# Patient Record
Sex: Female | Born: 1935 | Race: White | Hispanic: No | Smoking: Never smoker
Health system: Southern US, Community
[De-identification: ages and names within clinical notes are randomized; demographics above are authoritative.]

## PROBLEM LIST (undated history)

## (undated) DIAGNOSIS — I10 Essential (primary) hypertension: Secondary | ICD-10-CM

## (undated) DIAGNOSIS — I639 Cerebral infarction, unspecified: Secondary | ICD-10-CM

## (undated) DIAGNOSIS — E876 Hypokalemia: Secondary | ICD-10-CM

## (undated) DIAGNOSIS — K219 Gastro-esophageal reflux disease without esophagitis: Secondary | ICD-10-CM

## (undated) HISTORY — PX: GALLBLADDER SURGERY: SHX652

## (undated) HISTORY — DX: Gastro-esophageal reflux disease without esophagitis: K21.9

## (undated) HISTORY — DX: Essential (primary) hypertension: I10

## (undated) HISTORY — DX: Hypokalemia: E87.6

## (undated) HISTORY — DX: Cerebral infarction, unspecified: I63.9

## (undated) HISTORY — PX: HIP FRACTURE SURGERY: SHX118

---

## 2005-01-24 ENCOUNTER — Ambulatory Visit: Payer: Self-pay | Admitting: Family Medicine

## 2007-08-01 ENCOUNTER — Ambulatory Visit: Payer: Self-pay | Admitting: Internal Medicine

## 2009-12-04 ENCOUNTER — Inpatient Hospital Stay: Payer: Self-pay | Admitting: Specialist

## 2009-12-09 ENCOUNTER — Encounter: Payer: Self-pay | Admitting: Internal Medicine

## 2010-01-02 ENCOUNTER — Encounter: Payer: Self-pay | Admitting: Internal Medicine

## 2010-01-04 ENCOUNTER — Ambulatory Visit: Payer: Self-pay | Admitting: Internal Medicine

## 2011-10-12 ENCOUNTER — Observation Stay: Payer: Self-pay | Admitting: Specialist

## 2011-10-16 DIAGNOSIS — Z7901 Long term (current) use of anticoagulants: Secondary | ICD-10-CM | POA: Diagnosis not present

## 2011-10-16 DIAGNOSIS — I10 Essential (primary) hypertension: Secondary | ICD-10-CM | POA: Diagnosis not present

## 2011-10-16 DIAGNOSIS — Z8673 Personal history of transient ischemic attack (TIA), and cerebral infarction without residual deficits: Secondary | ICD-10-CM | POA: Diagnosis not present

## 2011-10-16 DIAGNOSIS — M25559 Pain in unspecified hip: Secondary | ICD-10-CM | POA: Diagnosis not present

## 2011-10-16 DIAGNOSIS — Z8672 Personal history of thrombophlebitis: Secondary | ICD-10-CM | POA: Diagnosis not present

## 2011-10-16 DIAGNOSIS — R269 Unspecified abnormalities of gait and mobility: Secondary | ICD-10-CM | POA: Diagnosis not present

## 2011-10-17 DIAGNOSIS — R269 Unspecified abnormalities of gait and mobility: Secondary | ICD-10-CM | POA: Diagnosis not present

## 2011-10-17 DIAGNOSIS — Z8672 Personal history of thrombophlebitis: Secondary | ICD-10-CM | POA: Diagnosis not present

## 2011-10-17 DIAGNOSIS — Z7901 Long term (current) use of anticoagulants: Secondary | ICD-10-CM | POA: Diagnosis not present

## 2011-10-17 DIAGNOSIS — Z8673 Personal history of transient ischemic attack (TIA), and cerebral infarction without residual deficits: Secondary | ICD-10-CM | POA: Diagnosis not present

## 2011-10-17 DIAGNOSIS — M25559 Pain in unspecified hip: Secondary | ICD-10-CM | POA: Diagnosis not present

## 2011-10-17 DIAGNOSIS — I10 Essential (primary) hypertension: Secondary | ICD-10-CM | POA: Diagnosis not present

## 2011-10-18 DIAGNOSIS — I10 Essential (primary) hypertension: Secondary | ICD-10-CM | POA: Diagnosis not present

## 2011-10-18 DIAGNOSIS — M25559 Pain in unspecified hip: Secondary | ICD-10-CM | POA: Diagnosis not present

## 2011-10-18 DIAGNOSIS — R269 Unspecified abnormalities of gait and mobility: Secondary | ICD-10-CM | POA: Diagnosis not present

## 2011-10-18 DIAGNOSIS — Z8672 Personal history of thrombophlebitis: Secondary | ICD-10-CM | POA: Diagnosis not present

## 2011-10-18 DIAGNOSIS — Z7901 Long term (current) use of anticoagulants: Secondary | ICD-10-CM | POA: Diagnosis not present

## 2011-10-18 DIAGNOSIS — Z8673 Personal history of transient ischemic attack (TIA), and cerebral infarction without residual deficits: Secondary | ICD-10-CM | POA: Diagnosis not present

## 2011-10-22 DIAGNOSIS — I10 Essential (primary) hypertension: Secondary | ICD-10-CM | POA: Diagnosis not present

## 2011-10-22 DIAGNOSIS — Z7901 Long term (current) use of anticoagulants: Secondary | ICD-10-CM | POA: Diagnosis not present

## 2011-10-22 DIAGNOSIS — Z8672 Personal history of thrombophlebitis: Secondary | ICD-10-CM | POA: Diagnosis not present

## 2011-10-22 DIAGNOSIS — M25559 Pain in unspecified hip: Secondary | ICD-10-CM | POA: Diagnosis not present

## 2011-10-22 DIAGNOSIS — Z8673 Personal history of transient ischemic attack (TIA), and cerebral infarction without residual deficits: Secondary | ICD-10-CM | POA: Diagnosis not present

## 2011-10-22 DIAGNOSIS — R269 Unspecified abnormalities of gait and mobility: Secondary | ICD-10-CM | POA: Diagnosis not present

## 2011-10-23 DIAGNOSIS — Z8673 Personal history of transient ischemic attack (TIA), and cerebral infarction without residual deficits: Secondary | ICD-10-CM | POA: Diagnosis not present

## 2011-10-23 DIAGNOSIS — Z8672 Personal history of thrombophlebitis: Secondary | ICD-10-CM | POA: Diagnosis not present

## 2011-10-23 DIAGNOSIS — Z7901 Long term (current) use of anticoagulants: Secondary | ICD-10-CM | POA: Diagnosis not present

## 2011-10-23 DIAGNOSIS — I10 Essential (primary) hypertension: Secondary | ICD-10-CM | POA: Diagnosis not present

## 2011-10-23 DIAGNOSIS — M25559 Pain in unspecified hip: Secondary | ICD-10-CM | POA: Diagnosis not present

## 2011-10-23 DIAGNOSIS — R269 Unspecified abnormalities of gait and mobility: Secondary | ICD-10-CM | POA: Diagnosis not present

## 2011-10-25 DIAGNOSIS — Z7901 Long term (current) use of anticoagulants: Secondary | ICD-10-CM | POA: Diagnosis not present

## 2011-10-25 DIAGNOSIS — Z8672 Personal history of thrombophlebitis: Secondary | ICD-10-CM | POA: Diagnosis not present

## 2011-10-25 DIAGNOSIS — Z8673 Personal history of transient ischemic attack (TIA), and cerebral infarction without residual deficits: Secondary | ICD-10-CM | POA: Diagnosis not present

## 2011-10-25 DIAGNOSIS — I10 Essential (primary) hypertension: Secondary | ICD-10-CM | POA: Diagnosis not present

## 2011-10-25 DIAGNOSIS — M25559 Pain in unspecified hip: Secondary | ICD-10-CM | POA: Diagnosis not present

## 2011-10-25 DIAGNOSIS — R269 Unspecified abnormalities of gait and mobility: Secondary | ICD-10-CM | POA: Diagnosis not present

## 2011-10-30 DIAGNOSIS — M25559 Pain in unspecified hip: Secondary | ICD-10-CM | POA: Diagnosis not present

## 2011-10-30 DIAGNOSIS — I10 Essential (primary) hypertension: Secondary | ICD-10-CM | POA: Diagnosis not present

## 2011-10-30 DIAGNOSIS — R269 Unspecified abnormalities of gait and mobility: Secondary | ICD-10-CM | POA: Diagnosis not present

## 2011-10-30 DIAGNOSIS — Z7901 Long term (current) use of anticoagulants: Secondary | ICD-10-CM | POA: Diagnosis not present

## 2011-10-30 DIAGNOSIS — Z8672 Personal history of thrombophlebitis: Secondary | ICD-10-CM | POA: Diagnosis not present

## 2011-10-30 DIAGNOSIS — Z8673 Personal history of transient ischemic attack (TIA), and cerebral infarction without residual deficits: Secondary | ICD-10-CM | POA: Diagnosis not present

## 2011-10-31 DIAGNOSIS — R269 Unspecified abnormalities of gait and mobility: Secondary | ICD-10-CM | POA: Diagnosis not present

## 2011-10-31 DIAGNOSIS — Z8672 Personal history of thrombophlebitis: Secondary | ICD-10-CM | POA: Diagnosis not present

## 2011-10-31 DIAGNOSIS — Z7901 Long term (current) use of anticoagulants: Secondary | ICD-10-CM | POA: Diagnosis not present

## 2011-10-31 DIAGNOSIS — I10 Essential (primary) hypertension: Secondary | ICD-10-CM | POA: Diagnosis not present

## 2011-10-31 DIAGNOSIS — Z8673 Personal history of transient ischemic attack (TIA), and cerebral infarction without residual deficits: Secondary | ICD-10-CM | POA: Diagnosis not present

## 2011-10-31 DIAGNOSIS — M25559 Pain in unspecified hip: Secondary | ICD-10-CM | POA: Diagnosis not present

## 2011-11-07 DIAGNOSIS — Z8673 Personal history of transient ischemic attack (TIA), and cerebral infarction without residual deficits: Secondary | ICD-10-CM | POA: Diagnosis not present

## 2011-11-07 DIAGNOSIS — I10 Essential (primary) hypertension: Secondary | ICD-10-CM | POA: Diagnosis not present

## 2011-11-07 DIAGNOSIS — Z7901 Long term (current) use of anticoagulants: Secondary | ICD-10-CM | POA: Diagnosis not present

## 2011-11-07 DIAGNOSIS — Z8672 Personal history of thrombophlebitis: Secondary | ICD-10-CM | POA: Diagnosis not present

## 2011-11-07 DIAGNOSIS — R269 Unspecified abnormalities of gait and mobility: Secondary | ICD-10-CM | POA: Diagnosis not present

## 2011-11-07 DIAGNOSIS — M25559 Pain in unspecified hip: Secondary | ICD-10-CM | POA: Diagnosis not present

## 2011-12-04 ENCOUNTER — Inpatient Hospital Stay: Payer: Self-pay | Admitting: Internal Medicine

## 2011-12-04 DIAGNOSIS — R6889 Other general symptoms and signs: Secondary | ICD-10-CM | POA: Diagnosis not present

## 2011-12-04 DIAGNOSIS — M25559 Pain in unspecified hip: Secondary | ICD-10-CM | POA: Diagnosis not present

## 2011-12-04 DIAGNOSIS — I10 Essential (primary) hypertension: Secondary | ICD-10-CM | POA: Diagnosis not present

## 2011-12-04 DIAGNOSIS — Z86718 Personal history of other venous thrombosis and embolism: Secondary | ICD-10-CM | POA: Diagnosis not present

## 2011-12-04 DIAGNOSIS — Z79899 Other long term (current) drug therapy: Secondary | ICD-10-CM | POA: Diagnosis not present

## 2011-12-04 DIAGNOSIS — Z809 Family history of malignant neoplasm, unspecified: Secondary | ICD-10-CM | POA: Diagnosis not present

## 2011-12-04 DIAGNOSIS — M1990 Unspecified osteoarthritis, unspecified site: Secondary | ICD-10-CM | POA: Diagnosis not present

## 2011-12-04 DIAGNOSIS — Z841 Family history of disorders of kidney and ureter: Secondary | ICD-10-CM | POA: Diagnosis not present

## 2011-12-04 DIAGNOSIS — Z8673 Personal history of transient ischemic attack (TIA), and cerebral infarction without residual deficits: Secondary | ICD-10-CM | POA: Diagnosis not present

## 2011-12-04 DIAGNOSIS — Z7901 Long term (current) use of anticoagulants: Secondary | ICD-10-CM | POA: Diagnosis not present

## 2011-12-04 DIAGNOSIS — K219 Gastro-esophageal reflux disease without esophagitis: Secondary | ICD-10-CM | POA: Diagnosis not present

## 2011-12-04 DIAGNOSIS — S32509A Unspecified fracture of unspecified pubis, initial encounter for closed fracture: Secondary | ICD-10-CM | POA: Diagnosis not present

## 2011-12-04 DIAGNOSIS — Z9181 History of falling: Secondary | ICD-10-CM | POA: Diagnosis not present

## 2011-12-04 DIAGNOSIS — R079 Chest pain, unspecified: Secondary | ICD-10-CM | POA: Diagnosis not present

## 2011-12-04 DIAGNOSIS — Z9089 Acquired absence of other organs: Secondary | ICD-10-CM | POA: Diagnosis not present

## 2011-12-04 DIAGNOSIS — IMO0002 Reserved for concepts with insufficient information to code with codable children: Secondary | ICD-10-CM | POA: Diagnosis not present

## 2011-12-04 DIAGNOSIS — M25569 Pain in unspecified knee: Secondary | ICD-10-CM | POA: Diagnosis not present

## 2011-12-04 DIAGNOSIS — Z833 Family history of diabetes mellitus: Secondary | ICD-10-CM | POA: Diagnosis not present

## 2011-12-04 DIAGNOSIS — I82409 Acute embolism and thrombosis of unspecified deep veins of unspecified lower extremity: Secondary | ICD-10-CM | POA: Diagnosis not present

## 2011-12-04 LAB — BASIC METABOLIC PANEL
Anion Gap: 12 (ref 7–16)
Creatinine: 0.75 mg/dL (ref 0.60–1.30)
EGFR (African American): >60
Osmolality: 269 (ref 275–301)
Sodium: 134 mmol/L — ABNORMAL LOW (ref 136–145)

## 2011-12-04 LAB — PROTIME-INR
INR: 2.3
Prothrombin Time: 25.1 secs — ABNORMAL HIGH (ref 11.5–14.7)

## 2011-12-04 LAB — CBC
HCT: 39.9 % (ref 35.0–47.0)
HGB: 13.2 g/dL (ref 12.0–16.0)
MCH: 29.4 pg (ref 26.0–34.0)
MCHC: 33.2 g/dL (ref 32.0–36.0)
MCV: 89 fL (ref 80–100)
RBC: 4.51 10*6/uL (ref 3.80–5.20)

## 2011-12-04 LAB — URINALYSIS, COMPLETE
Bacteria: NONE SEEN
Glucose,UR: NEGATIVE mg/dL (ref 0–75)
Leukocyte Esterase: NEGATIVE
Nitrite: NEGATIVE
Protein: NEGATIVE
Specific Gravity: 1.012 (ref 1.003–1.030)
WBC UR: 2 /HPF (ref 0–5)

## 2011-12-05 LAB — BASIC METABOLIC PANEL
Anion Gap: 11 (ref 7–16)
BUN: 13 mg/dL (ref 7–18)
Chloride: 99 mmol/L (ref 98–107)
Co2: 26 mmol/L (ref 21–32)
Creatinine: 0.53 mg/dL — ABNORMAL LOW (ref 0.60–1.30)
EGFR (African American): >60
EGFR (Non-African Amer.): >60
Glucose: 86 mg/dL (ref 65–99)

## 2011-12-05 LAB — CBC WITH DIFFERENTIAL/PLATELET
Basophil #: 0 10*3/uL (ref 0.0–0.1)
Basophil %: 0.9 %
Eosinophil %: 3.6 %
HCT: 34.9 % — ABNORMAL LOW (ref 35.0–47.0)
HGB: 11.8 g/dL — ABNORMAL LOW (ref 12.0–16.0)
Lymphocyte %: 22.9 %
MCHC: 33.8 g/dL (ref 32.0–36.0)
Monocyte %: 7.9 %
Neutrophil #: 2.3 10*3/uL (ref 1.4–6.5)
Neutrophil %: 64.7 %
RBC: 3.96 10*6/uL (ref 3.80–5.20)
WBC: 3.5 10*3/uL — ABNORMAL LOW (ref 3.6–11.0)

## 2011-12-05 LAB — LIPID PANEL
Ldl Cholesterol, Calc: 77 mg/dL (ref 0–100)
VLDL Cholesterol, Calc: 15 mg/dL (ref 5–40)

## 2011-12-05 LAB — HEMOGLOBIN A1C: Hemoglobin A1C: 5.5 % (ref 4.2–6.3)

## 2011-12-05 LAB — PROTIME-INR: Prothrombin Time: 27.7 secs — ABNORMAL HIGH (ref 11.5–14.7)

## 2011-12-06 LAB — PROTIME-INR
INR: 3.3
Prothrombin Time: 33.7 secs — ABNORMAL HIGH (ref 11.5–14.7)

## 2011-12-07 DIAGNOSIS — IMO0001 Reserved for inherently not codable concepts without codable children: Secondary | ICD-10-CM | POA: Diagnosis not present

## 2011-12-07 DIAGNOSIS — R269 Unspecified abnormalities of gait and mobility: Secondary | ICD-10-CM | POA: Diagnosis not present

## 2011-12-07 DIAGNOSIS — Z8673 Personal history of transient ischemic attack (TIA), and cerebral infarction without residual deficits: Secondary | ICD-10-CM | POA: Diagnosis not present

## 2011-12-07 DIAGNOSIS — M25559 Pain in unspecified hip: Secondary | ICD-10-CM | POA: Diagnosis not present

## 2011-12-07 DIAGNOSIS — I10 Essential (primary) hypertension: Secondary | ICD-10-CM | POA: Diagnosis not present

## 2011-12-10 DIAGNOSIS — IMO0001 Reserved for inherently not codable concepts without codable children: Secondary | ICD-10-CM | POA: Diagnosis not present

## 2011-12-10 DIAGNOSIS — M25559 Pain in unspecified hip: Secondary | ICD-10-CM | POA: Diagnosis not present

## 2011-12-10 DIAGNOSIS — I10 Essential (primary) hypertension: Secondary | ICD-10-CM | POA: Diagnosis not present

## 2011-12-10 DIAGNOSIS — Z8673 Personal history of transient ischemic attack (TIA), and cerebral infarction without residual deficits: Secondary | ICD-10-CM | POA: Diagnosis not present

## 2011-12-10 DIAGNOSIS — R269 Unspecified abnormalities of gait and mobility: Secondary | ICD-10-CM | POA: Diagnosis not present

## 2011-12-11 DIAGNOSIS — Z8673 Personal history of transient ischemic attack (TIA), and cerebral infarction without residual deficits: Secondary | ICD-10-CM | POA: Diagnosis not present

## 2011-12-11 DIAGNOSIS — M25559 Pain in unspecified hip: Secondary | ICD-10-CM | POA: Diagnosis not present

## 2011-12-11 DIAGNOSIS — IMO0001 Reserved for inherently not codable concepts without codable children: Secondary | ICD-10-CM | POA: Diagnosis not present

## 2011-12-11 DIAGNOSIS — R269 Unspecified abnormalities of gait and mobility: Secondary | ICD-10-CM | POA: Diagnosis not present

## 2011-12-11 DIAGNOSIS — I10 Essential (primary) hypertension: Secondary | ICD-10-CM | POA: Diagnosis not present

## 2011-12-13 DIAGNOSIS — Z8673 Personal history of transient ischemic attack (TIA), and cerebral infarction without residual deficits: Secondary | ICD-10-CM | POA: Diagnosis not present

## 2011-12-13 DIAGNOSIS — R269 Unspecified abnormalities of gait and mobility: Secondary | ICD-10-CM | POA: Diagnosis not present

## 2011-12-13 DIAGNOSIS — M25559 Pain in unspecified hip: Secondary | ICD-10-CM | POA: Diagnosis not present

## 2011-12-13 DIAGNOSIS — I10 Essential (primary) hypertension: Secondary | ICD-10-CM | POA: Diagnosis not present

## 2011-12-13 DIAGNOSIS — IMO0001 Reserved for inherently not codable concepts without codable children: Secondary | ICD-10-CM | POA: Diagnosis not present

## 2011-12-16 DIAGNOSIS — S72009D Fracture of unspecified part of neck of unspecified femur, subsequent encounter for closed fracture with routine healing: Secondary | ICD-10-CM | POA: Diagnosis not present

## 2011-12-16 DIAGNOSIS — M1990 Unspecified osteoarthritis, unspecified site: Secondary | ICD-10-CM | POA: Diagnosis not present

## 2011-12-17 DIAGNOSIS — I10 Essential (primary) hypertension: Secondary | ICD-10-CM | POA: Diagnosis not present

## 2011-12-17 DIAGNOSIS — IMO0001 Reserved for inherently not codable concepts without codable children: Secondary | ICD-10-CM | POA: Diagnosis not present

## 2011-12-17 DIAGNOSIS — M25559 Pain in unspecified hip: Secondary | ICD-10-CM | POA: Diagnosis not present

## 2011-12-17 DIAGNOSIS — R269 Unspecified abnormalities of gait and mobility: Secondary | ICD-10-CM | POA: Diagnosis not present

## 2011-12-17 DIAGNOSIS — Z8673 Personal history of transient ischemic attack (TIA), and cerebral infarction without residual deficits: Secondary | ICD-10-CM | POA: Diagnosis not present

## 2011-12-18 DIAGNOSIS — IMO0001 Reserved for inherently not codable concepts without codable children: Secondary | ICD-10-CM | POA: Diagnosis not present

## 2011-12-18 DIAGNOSIS — R269 Unspecified abnormalities of gait and mobility: Secondary | ICD-10-CM | POA: Diagnosis not present

## 2011-12-18 DIAGNOSIS — M25559 Pain in unspecified hip: Secondary | ICD-10-CM | POA: Diagnosis not present

## 2011-12-18 DIAGNOSIS — I10 Essential (primary) hypertension: Secondary | ICD-10-CM | POA: Diagnosis not present

## 2011-12-18 DIAGNOSIS — Z8673 Personal history of transient ischemic attack (TIA), and cerebral infarction without residual deficits: Secondary | ICD-10-CM | POA: Diagnosis not present

## 2011-12-19 DIAGNOSIS — M25559 Pain in unspecified hip: Secondary | ICD-10-CM | POA: Diagnosis not present

## 2011-12-19 DIAGNOSIS — IMO0001 Reserved for inherently not codable concepts without codable children: Secondary | ICD-10-CM | POA: Diagnosis not present

## 2011-12-19 DIAGNOSIS — R269 Unspecified abnormalities of gait and mobility: Secondary | ICD-10-CM | POA: Diagnosis not present

## 2011-12-19 DIAGNOSIS — Z8673 Personal history of transient ischemic attack (TIA), and cerebral infarction without residual deficits: Secondary | ICD-10-CM | POA: Diagnosis not present

## 2011-12-19 DIAGNOSIS — I10 Essential (primary) hypertension: Secondary | ICD-10-CM | POA: Diagnosis not present

## 2011-12-24 DIAGNOSIS — I10 Essential (primary) hypertension: Secondary | ICD-10-CM | POA: Diagnosis not present

## 2011-12-24 DIAGNOSIS — IMO0001 Reserved for inherently not codable concepts without codable children: Secondary | ICD-10-CM | POA: Diagnosis not present

## 2011-12-24 DIAGNOSIS — M25559 Pain in unspecified hip: Secondary | ICD-10-CM | POA: Diagnosis not present

## 2011-12-24 DIAGNOSIS — Z8673 Personal history of transient ischemic attack (TIA), and cerebral infarction without residual deficits: Secondary | ICD-10-CM | POA: Diagnosis not present

## 2011-12-24 DIAGNOSIS — R269 Unspecified abnormalities of gait and mobility: Secondary | ICD-10-CM | POA: Diagnosis not present

## 2011-12-27 DIAGNOSIS — I10 Essential (primary) hypertension: Secondary | ICD-10-CM | POA: Diagnosis not present

## 2011-12-27 DIAGNOSIS — R269 Unspecified abnormalities of gait and mobility: Secondary | ICD-10-CM | POA: Diagnosis not present

## 2011-12-27 DIAGNOSIS — M25559 Pain in unspecified hip: Secondary | ICD-10-CM | POA: Diagnosis not present

## 2011-12-27 DIAGNOSIS — Z8673 Personal history of transient ischemic attack (TIA), and cerebral infarction without residual deficits: Secondary | ICD-10-CM | POA: Diagnosis not present

## 2011-12-27 DIAGNOSIS — IMO0001 Reserved for inherently not codable concepts without codable children: Secondary | ICD-10-CM | POA: Diagnosis not present

## 2011-12-31 DIAGNOSIS — R269 Unspecified abnormalities of gait and mobility: Secondary | ICD-10-CM | POA: Diagnosis not present

## 2011-12-31 DIAGNOSIS — I10 Essential (primary) hypertension: Secondary | ICD-10-CM | POA: Diagnosis not present

## 2011-12-31 DIAGNOSIS — Z8673 Personal history of transient ischemic attack (TIA), and cerebral infarction without residual deficits: Secondary | ICD-10-CM | POA: Diagnosis not present

## 2011-12-31 DIAGNOSIS — M25559 Pain in unspecified hip: Secondary | ICD-10-CM | POA: Diagnosis not present

## 2011-12-31 DIAGNOSIS — IMO0001 Reserved for inherently not codable concepts without codable children: Secondary | ICD-10-CM | POA: Diagnosis not present

## 2012-01-02 DIAGNOSIS — Z8673 Personal history of transient ischemic attack (TIA), and cerebral infarction without residual deficits: Secondary | ICD-10-CM | POA: Diagnosis not present

## 2012-01-02 DIAGNOSIS — I10 Essential (primary) hypertension: Secondary | ICD-10-CM | POA: Diagnosis not present

## 2012-01-02 DIAGNOSIS — M25559 Pain in unspecified hip: Secondary | ICD-10-CM | POA: Diagnosis not present

## 2012-01-02 DIAGNOSIS — IMO0001 Reserved for inherently not codable concepts without codable children: Secondary | ICD-10-CM | POA: Diagnosis not present

## 2012-01-02 DIAGNOSIS — R269 Unspecified abnormalities of gait and mobility: Secondary | ICD-10-CM | POA: Diagnosis not present

## 2012-02-04 DIAGNOSIS — Z7901 Long term (current) use of anticoagulants: Secondary | ICD-10-CM | POA: Diagnosis not present

## 2012-03-03 DIAGNOSIS — H61019 Acute perichondritis of external ear, unspecified ear: Secondary | ICD-10-CM | POA: Diagnosis not present

## 2012-03-03 DIAGNOSIS — H903 Sensorineural hearing loss, bilateral: Secondary | ICD-10-CM | POA: Diagnosis not present

## 2012-03-03 DIAGNOSIS — H9209 Otalgia, unspecified ear: Secondary | ICD-10-CM | POA: Diagnosis not present

## 2012-03-19 DIAGNOSIS — Z7901 Long term (current) use of anticoagulants: Secondary | ICD-10-CM | POA: Diagnosis not present

## 2012-04-07 DIAGNOSIS — E785 Hyperlipidemia, unspecified: Secondary | ICD-10-CM | POA: Diagnosis not present

## 2012-04-07 DIAGNOSIS — Z79899 Other long term (current) drug therapy: Secondary | ICD-10-CM | POA: Diagnosis not present

## 2012-04-07 DIAGNOSIS — I10 Essential (primary) hypertension: Secondary | ICD-10-CM | POA: Diagnosis not present

## 2012-04-07 DIAGNOSIS — K219 Gastro-esophageal reflux disease without esophagitis: Secondary | ICD-10-CM | POA: Diagnosis not present

## 2012-04-07 DIAGNOSIS — E876 Hypokalemia: Secondary | ICD-10-CM | POA: Diagnosis not present

## 2012-04-07 DIAGNOSIS — Z7901 Long term (current) use of anticoagulants: Secondary | ICD-10-CM | POA: Diagnosis not present

## 2012-05-19 DIAGNOSIS — Z7901 Long term (current) use of anticoagulants: Secondary | ICD-10-CM | POA: Diagnosis not present

## 2012-07-08 ENCOUNTER — Emergency Department: Payer: Self-pay | Admitting: *Deleted

## 2012-07-08 DIAGNOSIS — H9209 Otalgia, unspecified ear: Secondary | ICD-10-CM | POA: Diagnosis not present

## 2012-07-08 DIAGNOSIS — R52 Pain, unspecified: Secondary | ICD-10-CM | POA: Diagnosis not present

## 2012-07-13 DIAGNOSIS — Z7901 Long term (current) use of anticoagulants: Secondary | ICD-10-CM | POA: Diagnosis not present

## 2012-08-21 DIAGNOSIS — Z7901 Long term (current) use of anticoagulants: Secondary | ICD-10-CM | POA: Diagnosis not present

## 2012-09-08 DIAGNOSIS — Z7901 Long term (current) use of anticoagulants: Secondary | ICD-10-CM | POA: Diagnosis not present

## 2012-09-22 DIAGNOSIS — Z7901 Long term (current) use of anticoagulants: Secondary | ICD-10-CM | POA: Diagnosis not present

## 2012-10-20 DIAGNOSIS — K219 Gastro-esophageal reflux disease without esophagitis: Secondary | ICD-10-CM | POA: Diagnosis not present

## 2012-10-20 DIAGNOSIS — E876 Hypokalemia: Secondary | ICD-10-CM | POA: Diagnosis not present

## 2012-10-20 DIAGNOSIS — I10 Essential (primary) hypertension: Secondary | ICD-10-CM | POA: Diagnosis not present

## 2012-10-20 DIAGNOSIS — Z7901 Long term (current) use of anticoagulants: Secondary | ICD-10-CM | POA: Diagnosis not present

## 2012-11-16 DIAGNOSIS — Z7901 Long term (current) use of anticoagulants: Secondary | ICD-10-CM | POA: Diagnosis not present

## 2012-12-01 DIAGNOSIS — Z7901 Long term (current) use of anticoagulants: Secondary | ICD-10-CM | POA: Diagnosis not present

## 2013-02-15 DIAGNOSIS — Z7901 Long term (current) use of anticoagulants: Secondary | ICD-10-CM | POA: Diagnosis not present

## 2013-04-13 DIAGNOSIS — E876 Hypokalemia: Secondary | ICD-10-CM | POA: Diagnosis not present

## 2013-04-13 DIAGNOSIS — K219 Gastro-esophageal reflux disease without esophagitis: Secondary | ICD-10-CM | POA: Diagnosis not present

## 2013-04-13 DIAGNOSIS — I10 Essential (primary) hypertension: Secondary | ICD-10-CM | POA: Diagnosis not present

## 2013-04-13 DIAGNOSIS — E785 Hyperlipidemia, unspecified: Secondary | ICD-10-CM | POA: Diagnosis not present

## 2013-04-13 DIAGNOSIS — N390 Urinary tract infection, site not specified: Secondary | ICD-10-CM | POA: Diagnosis not present

## 2013-04-13 DIAGNOSIS — Z7901 Long term (current) use of anticoagulants: Secondary | ICD-10-CM | POA: Diagnosis not present

## 2013-06-28 DIAGNOSIS — M7520 Bicipital tendinitis, unspecified shoulder: Secondary | ICD-10-CM | POA: Diagnosis not present

## 2013-06-28 DIAGNOSIS — Z7901 Long term (current) use of anticoagulants: Secondary | ICD-10-CM | POA: Diagnosis not present

## 2013-07-23 DIAGNOSIS — Z7901 Long term (current) use of anticoagulants: Secondary | ICD-10-CM | POA: Diagnosis not present

## 2013-07-23 DIAGNOSIS — S2239XA Fracture of one rib, unspecified side, initial encounter for closed fracture: Secondary | ICD-10-CM | POA: Diagnosis not present

## 2013-09-06 DIAGNOSIS — Z7901 Long term (current) use of anticoagulants: Secondary | ICD-10-CM | POA: Diagnosis not present

## 2013-10-13 DIAGNOSIS — K219 Gastro-esophageal reflux disease without esophagitis: Secondary | ICD-10-CM | POA: Diagnosis not present

## 2013-10-13 DIAGNOSIS — I10 Essential (primary) hypertension: Secondary | ICD-10-CM | POA: Diagnosis not present

## 2013-10-13 DIAGNOSIS — E785 Hyperlipidemia, unspecified: Secondary | ICD-10-CM | POA: Diagnosis not present

## 2013-10-13 DIAGNOSIS — Z7901 Long term (current) use of anticoagulants: Secondary | ICD-10-CM | POA: Diagnosis not present

## 2013-11-09 DIAGNOSIS — Z7901 Long term (current) use of anticoagulants: Secondary | ICD-10-CM | POA: Diagnosis not present

## 2013-11-23 DIAGNOSIS — M5137 Other intervertebral disc degeneration, lumbosacral region: Secondary | ICD-10-CM | POA: Diagnosis not present

## 2013-11-23 DIAGNOSIS — N390 Urinary tract infection, site not specified: Secondary | ICD-10-CM | POA: Diagnosis not present

## 2013-12-13 DIAGNOSIS — Z7901 Long term (current) use of anticoagulants: Secondary | ICD-10-CM | POA: Diagnosis not present

## 2013-12-13 DIAGNOSIS — J329 Chronic sinusitis, unspecified: Secondary | ICD-10-CM | POA: Diagnosis not present

## 2014-01-17 DIAGNOSIS — Z7901 Long term (current) use of anticoagulants: Secondary | ICD-10-CM | POA: Diagnosis not present

## 2014-02-02 DIAGNOSIS — M79609 Pain in unspecified limb: Secondary | ICD-10-CM | POA: Diagnosis not present

## 2014-02-02 DIAGNOSIS — Z7901 Long term (current) use of anticoagulants: Secondary | ICD-10-CM | POA: Diagnosis not present

## 2014-02-02 DIAGNOSIS — M7989 Other specified soft tissue disorders: Secondary | ICD-10-CM | POA: Diagnosis not present

## 2014-02-02 DIAGNOSIS — M1990 Unspecified osteoarthritis, unspecified site: Secondary | ICD-10-CM | POA: Diagnosis not present

## 2014-02-02 DIAGNOSIS — I824Y9 Acute embolism and thrombosis of unspecified deep veins of unspecified proximal lower extremity: Secondary | ICD-10-CM | POA: Diagnosis not present

## 2014-02-23 DIAGNOSIS — Z7901 Long term (current) use of anticoagulants: Secondary | ICD-10-CM | POA: Diagnosis not present

## 2014-03-04 DIAGNOSIS — M7989 Other specified soft tissue disorders: Secondary | ICD-10-CM | POA: Diagnosis not present

## 2014-03-04 DIAGNOSIS — I87099 Postthrombotic syndrome with other complications of unspecified lower extremity: Secondary | ICD-10-CM | POA: Diagnosis not present

## 2014-03-04 DIAGNOSIS — I10 Essential (primary) hypertension: Secondary | ICD-10-CM | POA: Diagnosis not present

## 2014-03-04 DIAGNOSIS — M79609 Pain in unspecified limb: Secondary | ICD-10-CM | POA: Diagnosis not present

## 2014-03-29 DIAGNOSIS — Z7901 Long term (current) use of anticoagulants: Secondary | ICD-10-CM | POA: Diagnosis not present

## 2014-04-27 DIAGNOSIS — Z7901 Long term (current) use of anticoagulants: Secondary | ICD-10-CM | POA: Diagnosis not present

## 2014-05-09 DIAGNOSIS — E785 Hyperlipidemia, unspecified: Secondary | ICD-10-CM | POA: Diagnosis not present

## 2014-05-09 DIAGNOSIS — I10 Essential (primary) hypertension: Secondary | ICD-10-CM | POA: Diagnosis not present

## 2014-05-09 DIAGNOSIS — Z7901 Long term (current) use of anticoagulants: Secondary | ICD-10-CM | POA: Diagnosis not present

## 2014-05-09 DIAGNOSIS — E876 Hypokalemia: Secondary | ICD-10-CM | POA: Diagnosis not present

## 2014-05-25 DIAGNOSIS — Z7901 Long term (current) use of anticoagulants: Secondary | ICD-10-CM | POA: Diagnosis not present

## 2014-06-21 DIAGNOSIS — I87099 Postthrombotic syndrome with other complications of unspecified lower extremity: Secondary | ICD-10-CM | POA: Diagnosis not present

## 2014-06-21 DIAGNOSIS — M79609 Pain in unspecified limb: Secondary | ICD-10-CM | POA: Diagnosis not present

## 2014-06-21 DIAGNOSIS — M7989 Other specified soft tissue disorders: Secondary | ICD-10-CM | POA: Diagnosis not present

## 2014-06-22 ENCOUNTER — Ambulatory Visit: Payer: Self-pay | Admitting: Orthopedic Surgery

## 2014-06-22 ENCOUNTER — Emergency Department: Payer: Self-pay | Admitting: Emergency Medicine

## 2014-06-22 DIAGNOSIS — S59909A Unspecified injury of unspecified elbow, initial encounter: Secondary | ICD-10-CM | POA: Diagnosis not present

## 2014-06-22 DIAGNOSIS — Z7901 Long term (current) use of anticoagulants: Secondary | ICD-10-CM | POA: Diagnosis not present

## 2014-06-22 DIAGNOSIS — Z79899 Other long term (current) drug therapy: Secondary | ICD-10-CM | POA: Diagnosis not present

## 2014-06-22 DIAGNOSIS — I10 Essential (primary) hypertension: Secondary | ICD-10-CM | POA: Diagnosis not present

## 2014-06-22 DIAGNOSIS — S52599A Other fractures of lower end of unspecified radius, initial encounter for closed fracture: Secondary | ICD-10-CM | POA: Diagnosis not present

## 2014-06-22 LAB — PROTIME-INR
INR: 1.8
Prothrombin Time: 20.8 secs — ABNORMAL HIGH (ref 11.5–14.7)

## 2014-06-24 DIAGNOSIS — I82409 Acute embolism and thrombosis of unspecified deep veins of unspecified lower extremity: Secondary | ICD-10-CM | POA: Insufficient documentation

## 2014-06-27 ENCOUNTER — Ambulatory Visit: Payer: Self-pay | Admitting: Orthopedic Surgery

## 2014-06-27 DIAGNOSIS — Z01810 Encounter for preprocedural cardiovascular examination: Secondary | ICD-10-CM | POA: Diagnosis not present

## 2014-06-27 DIAGNOSIS — S52539A Colles' fracture of unspecified radius, initial encounter for closed fracture: Secondary | ICD-10-CM | POA: Diagnosis not present

## 2014-06-27 DIAGNOSIS — Z01812 Encounter for preprocedural laboratory examination: Secondary | ICD-10-CM | POA: Diagnosis not present

## 2014-06-27 DIAGNOSIS — I10 Essential (primary) hypertension: Secondary | ICD-10-CM | POA: Diagnosis not present

## 2014-06-27 LAB — CBC WITH DIFFERENTIAL/PLATELET
Basophil #: 0 10*3/uL (ref 0.0–0.1)
Basophil %: 1 %
Eosinophil #: 0.2 10*3/uL (ref 0.0–0.7)
Eosinophil %: 3.4 %
HCT: 39.1 % (ref 35.0–47.0)
HGB: 12.5 g/dL (ref 12.0–16.0)
LYMPHS PCT: 20.3 %
Lymphocyte #: 1 10*3/uL (ref 1.0–3.6)
MCH: 28.8 pg (ref 26.0–34.0)
MCHC: 31.9 g/dL — ABNORMAL LOW (ref 32.0–36.0)
MCV: 90 fL (ref 80–100)
Monocyte #: 0.4 x10 3/mm (ref 0.2–0.9)
Monocyte %: 9 %
Neutrophil #: 3.3 10*3/uL (ref 1.4–6.5)
Neutrophil %: 66.3 %
Platelet: 269 10*3/uL (ref 150–440)
RBC: 4.33 10*6/uL (ref 3.80–5.20)
RDW: 13.5 % (ref 11.5–14.5)
WBC: 4.9 10*3/uL (ref 3.6–11.0)

## 2014-06-27 LAB — BASIC METABOLIC PANEL
Anion Gap: 11 (ref 7–16)
BUN: 19 mg/dL — ABNORMAL HIGH (ref 7–18)
Calcium, Total: 9.1 mg/dL (ref 8.5–10.1)
Chloride: 98 mmol/L (ref 98–107)
Co2: 30 mmol/L (ref 21–32)
Creatinine: 0.84 mg/dL (ref 0.60–1.30)
EGFR (Non-African Amer.): >60
GLUCOSE: 96 mg/dL (ref 65–99)
Osmolality: 280 (ref 275–301)
Potassium: 3.9 mmol/L (ref 3.5–5.1)
Sodium: 139 mmol/L (ref 136–145)

## 2014-06-28 ENCOUNTER — Ambulatory Visit: Payer: Self-pay | Admitting: Orthopedic Surgery

## 2014-06-28 DIAGNOSIS — S52599A Other fractures of lower end of unspecified radius, initial encounter for closed fracture: Secondary | ICD-10-CM | POA: Diagnosis not present

## 2014-06-28 DIAGNOSIS — S5290XA Unspecified fracture of unspecified forearm, initial encounter for closed fracture: Secondary | ICD-10-CM | POA: Diagnosis not present

## 2014-06-28 DIAGNOSIS — Z7901 Long term (current) use of anticoagulants: Secondary | ICD-10-CM | POA: Diagnosis not present

## 2014-06-28 DIAGNOSIS — S52109A Unspecified fracture of upper end of unspecified radius, initial encounter for closed fracture: Secondary | ICD-10-CM | POA: Diagnosis not present

## 2014-06-28 DIAGNOSIS — Z8249 Family history of ischemic heart disease and other diseases of the circulatory system: Secondary | ICD-10-CM | POA: Diagnosis not present

## 2014-06-28 DIAGNOSIS — IMO0002 Reserved for concepts with insufficient information to code with codable children: Secondary | ICD-10-CM | POA: Diagnosis not present

## 2014-06-28 DIAGNOSIS — Z86718 Personal history of other venous thrombosis and embolism: Secondary | ICD-10-CM | POA: Diagnosis not present

## 2014-06-28 DIAGNOSIS — I10 Essential (primary) hypertension: Secondary | ICD-10-CM | POA: Diagnosis not present

## 2014-06-28 DIAGNOSIS — Z96649 Presence of unspecified artificial hip joint: Secondary | ICD-10-CM | POA: Diagnosis not present

## 2014-06-28 DIAGNOSIS — Z8673 Personal history of transient ischemic attack (TIA), and cerebral infarction without residual deficits: Secondary | ICD-10-CM | POA: Diagnosis not present

## 2014-06-28 DIAGNOSIS — Z79899 Other long term (current) drug therapy: Secondary | ICD-10-CM | POA: Diagnosis not present

## 2014-06-28 DIAGNOSIS — K219 Gastro-esophageal reflux disease without esophagitis: Secondary | ICD-10-CM | POA: Diagnosis not present

## 2014-06-28 LAB — PROTIME-INR
INR: 1.1
PROTHROMBIN TIME: 13.9 s (ref 11.5–14.7)

## 2014-08-01 DIAGNOSIS — M25539 Pain in unspecified wrist: Secondary | ICD-10-CM | POA: Diagnosis not present

## 2014-08-01 DIAGNOSIS — Z7901 Long term (current) use of anticoagulants: Secondary | ICD-10-CM | POA: Diagnosis not present

## 2014-09-12 DIAGNOSIS — M25532 Pain in left wrist: Secondary | ICD-10-CM | POA: Diagnosis not present

## 2014-09-12 DIAGNOSIS — Z7901 Long term (current) use of anticoagulants: Secondary | ICD-10-CM | POA: Diagnosis not present

## 2014-09-26 DIAGNOSIS — Z7901 Long term (current) use of anticoagulants: Secondary | ICD-10-CM | POA: Diagnosis not present

## 2014-10-10 DIAGNOSIS — Z79899 Other long term (current) drug therapy: Secondary | ICD-10-CM | POA: Diagnosis not present

## 2014-10-11 DIAGNOSIS — D485 Neoplasm of uncertain behavior of skin: Secondary | ICD-10-CM | POA: Diagnosis not present

## 2014-10-11 DIAGNOSIS — L980 Pyogenic granuloma: Secondary | ICD-10-CM | POA: Diagnosis not present

## 2014-10-31 DIAGNOSIS — Z7901 Long term (current) use of anticoagulants: Secondary | ICD-10-CM | POA: Diagnosis not present

## 2014-11-16 DIAGNOSIS — E876 Hypokalemia: Secondary | ICD-10-CM | POA: Diagnosis not present

## 2014-11-16 DIAGNOSIS — E784 Other hyperlipidemia: Secondary | ICD-10-CM | POA: Diagnosis not present

## 2014-11-16 DIAGNOSIS — I10 Essential (primary) hypertension: Secondary | ICD-10-CM | POA: Diagnosis not present

## 2014-11-16 DIAGNOSIS — K219 Gastro-esophageal reflux disease without esophagitis: Secondary | ICD-10-CM | POA: Diagnosis not present

## 2014-12-05 DIAGNOSIS — Z7901 Long term (current) use of anticoagulants: Secondary | ICD-10-CM | POA: Diagnosis not present

## 2014-12-21 DIAGNOSIS — Z7901 Long term (current) use of anticoagulants: Secondary | ICD-10-CM | POA: Diagnosis not present

## 2015-01-02 DIAGNOSIS — Z7901 Long term (current) use of anticoagulants: Secondary | ICD-10-CM | POA: Diagnosis not present

## 2015-01-16 DIAGNOSIS — Z7901 Long term (current) use of anticoagulants: Secondary | ICD-10-CM | POA: Diagnosis not present

## 2015-01-24 DIAGNOSIS — I87099 Postthrombotic syndrome with other complications of unspecified lower extremity: Secondary | ICD-10-CM | POA: Diagnosis not present

## 2015-01-24 DIAGNOSIS — M7989 Other specified soft tissue disorders: Secondary | ICD-10-CM | POA: Diagnosis not present

## 2015-02-06 DIAGNOSIS — Z7901 Long term (current) use of anticoagulants: Secondary | ICD-10-CM | POA: Diagnosis not present

## 2015-02-20 DIAGNOSIS — Z7901 Long term (current) use of anticoagulants: Secondary | ICD-10-CM | POA: Diagnosis not present

## 2015-02-25 NOTE — Consult Note (Signed)
PATIENT NAME:  Laurie Fuentes, Laurie Fuentes MR#:  [id number] DATE OF BIRTH:  1926-03-24  DATE OF CONSULTATION:  06/22/2014  CONSULTING PHYSICIAN:  Mardene Sayer, MD.  CHIEF COMPLAINT: Left wrist pain.   HISTORY OF PRESENT ILLNESS: The patient is an 81 year old female who lives with her nephew, who was getting a drink out of  the refrigerator when she fell backwards and fell onto her outstretched left wrist.  She had immediate pain and deformity and inability to bear weight. She denies any injury elsewhere. She presents to the Emergency Department for evaluation.   Pain is located in the left wrist.  It does not radiate proximally or distally. She has no aggravating or relieving factors.   Of note, the patient is on Coumadin for previous DVT in her leg.   PAST MEDICAL HISTORY:  1.  Prior history of hip fracture status post repair.  2.  DVT on Coumadin.   MEDICATIONS: See electronic medical record.   ALLERGIES: NONE.   SOCIAL HISTORY: The patient lives in a trailer with her nephew. She does not drink or smoke.   FAMILY HISTORY: Noncontributory.   REVIEW OF SYSTEMS: The patient denies any fevers, chills, nausea, vomiting, chest pain, shortness of breath, difficulty breathing, difficulty urinating, changes in her bowel, bladder, mood or affect.   PHYSICAL EXAMINATION:  GENERAL:  The patient is a thin female in no apparent distress. She is slightly hard of hearing.  HEAD: Atraumatic.  NECK: Supple.   ABDOMEN: Soft. EXTREMITIES:  Left upper extremity has a dinner fork deformity. No evidence of carpal tunnel syndrome. Sensation intact throughout the hand.   RADIOGRAPHS: X-rays demonstrate a distal radius fracture with a dorsal lunate facet piece, slight dorsal angulation.   PROCEDURE: A closed reduction of the left distal radius was performed with a hematoma block, 20 mL lidocaine was instilled into the hematoma. Longitudinal traction, flexion and ulnar translation was performed in order to affect a  reduction. The reduction was then held with a sugar tong splint. The patient tolerated the procedure well.   X-RAYS:  Postreduction x-rays are pending.   ASSESSMENT: An 81 year old female with a left distal radius fracture.   DISCUSSION: I had a long discussion with the family and I explained that this has likely been unstable pattern of distal radius fracture due to the intraarticular comminution and initial displacement. However, given the fact that the patient is on Coumadin, minimally functional demand and has already previously had fragility fractures, I do not think that this is warranted for operative management. They verbalized understanding of this.   We will have her follow up in 1 week for routine followup. Anticipate that she will likely heal in some amount of malunion.  If she has any residual difficulty or a prominent ulna, this can be addressed at a later time, but given her age, it is most appropriate to treat this fracture nonoperatively.   ____________________________ Mardene Sayer, MD pa:DT D: 06/22/2014 20:57:06 ET T: 06/22/2014 21:32:41 ET JOB#: [id number]  cc: Mardene Sayer, MD, <Dictator> Raelyn Ensign. Stephanie Acre, MD PhD Orthopaedic Surgery ELECTRONICALLY SIGNED 06/23/2014 0:10

## 2015-02-25 NOTE — Op Note (Signed)
PATIENT NAME:  Laurie Fuentes, GUIFFRE MR#:  [id number] DATE OF BIRTH:  24-Apr-1926  DATE OF PROCEDURE:  06/28/2014   PREOPERATIVE DIAGNOSIS: Comminuted left distal radius fracture.   POSTOPERATIVE DIAGNOSIS: Comminuted left distal radius fracture.   PROCEDURE: Open reduction and internal fixation,  left distal radius.   ANESTHESIA: General.   SURGEON: Laurene Footman, M.D.   DESCRIPTION OF PROCEDURE: The patient was brought to the operating room and, after adequate general anesthesia was obtained, the left arm was prepped and draped in the usual sterile fashion with a tourniquet applied to the upper arm. Fingertrap traction was applied, after a timeout procedure been completed, through the index and middle fingers and 9-3/4 pounds of traction applied off the end of the table. After having had completed the timeout and patient identification procedures, the tourniquet was then raised to 250 mmHg and a volar incision was made to the distal radius with the incision centered over the FCR tendon. The FCR tendon sheath was incised and the tendon was retracted radially, protecting the radial artery and veins. The deep fascia was then incised and the quadratus pronator identified and elevated off the distal fragment. With traction, a good reduction was obtained with restoration of length,  radial tilt and some volar tilt. A short narrow DVR plate was applied and pinned in position. The shaft screws were placed first with three 10 mm screws holding the plate in appropriate position. The distal fragment was then held in a flexed position, and the distal peg holes were filled using standard technique, drilling, measurement off the drill, and then smooth pegs. All holes were filled then with smooth pegs. Traction was released and, on fluoroscopic exam, the fracture was stable.   The wound was irrigated and tourniquet let down. The wound was then closed with 3-0 Vicryl subcutaneously and 4-0 nylon for the skin. Xeroform,  4x4's, Webril and a volar splint were applied followed by an Ace wrap.   Tourniquet time was 22 minutes at 250 mmHg.   There were no complications or specimen.   IMPLANT: [employer] DVR left plate with multiple screws and smooth pegs.    ____________________________ Laurene Footman, MD mjm:MT D: 06/28/2014 21:47:31 ET T: 06/29/2014 08:56:50 ET JOB#: [id number]  cc: Laurene Footman, MD, <Dictator> Laurene Footman MD ELECTRONICALLY SIGNED 06/29/2014 17:44

## 2015-02-26 NOTE — Discharge Summary (Signed)
PATIENT NAME:  Laurie Fuentes, Laurie Fuentes MR#:  [id number] DATE OF BIRTH:  1926/10/11  DATE OF ADMISSION:  12/04/2011 DATE OF DISCHARGE:  12/06/2011  PRIMARY CARE PHYSICIAN: Otilio Miu, MD  DISCHARGE DIAGNOSES:  1. Status post fall with right pubic ramus fracture. 2. Hypertension. 3. Pain because of fracture. 4. History of deep venous thrombosis and stroke.  DISCHARGE MEDICATIONS:  1. HCTZ 25 mg p.o. daily. 2. Coumadin 3 mg p.o. daily. 3. Ranitidine 300 mg daily.  4. Tramadol 50 mg daily. 5. Percocet 5/325 mg every 6 hours p.r.n. pain.   DISCHARGE DIET: Low sodium diet.  ACTIVITY: The patient will be going home with home physical therapy and has a quad cane.   CONSULTANTS: Hessie Knows, MD  HOSPITAL COURSE:  This is an 81 year old female admitted on 12/04/2011 because of a fall at home. The patient has history of stroke and hypertension and history of right hip fracture. The patient just lost her balance and fell. She was found to have a right pubic ramus fracture. She was admitted to our service. The patient was seen by physical therapy. The patient also was seen by an orthopedic doctor, Dr. Rudene Christians. She ambulated 120 feet with a quad cane. The patient has good family support and the family wants the patient to go home, especially the daughter, with home physical therapy. We have arranged home physical therapy with [facility]. The patient also was seen by Dr. Rudene Christians who recommended physical therapy with weight bearing as tolerated. The patient has history of right hip fracture and the hip prosthesis is intact, according to the x-rays. The patient will use her quad cane and continue physical therapy. The patient's INR today is 3.3. She will continue her same dose of Coumadin. The rest of the labs are fine. She will see Dr. Ronnald Ramp on Monday regarding adjusting the Coumadin and checking the INR.   TIME SPENT ON DISCHARGE PREPARATION: More than 30 minutes.   ____________________________ Epifanio Lesches, MD sk:slb D: 12/06/2011 12:28:00 ET T: 12/08/2011 13:05:36 ET JOB#: [id number]  cc: Epifanio Lesches, MD, <Dictator> Juline Patch, MD Epifanio Lesches MD ELECTRONICALLY SIGNED 12/23/2011 16:26

## 2015-02-26 NOTE — Consult Note (Signed)
PATIENT NAME:  Laurie Fuentes, Laurie Fuentes MR#:  [id number] DATE OF BIRTH:  [birth year]  DATE OF CONSULTATION:  12/05/2011  REFERRING PHYSICIAN:   CONSULTING PHYSICIAN:  Laurene Footman, MD  REASON FOR CONSULTATION: Hip pain.   HISTORY OF PRESENT ILLNESS: The patient is an 81 year old who suffered a fall. She reports having fallen at home. She does have a history of about two years ago suffering a right femoral neck fracture and treated with endoprosthesis and has used a cane since that time but she remains active. She does not drive but she gets around town and outside of her home despite this prior hip surgery. She complains of pain in the right hip and has difficulty with ambulation.   PHYSICAL EXAMINATION: On exam she has slight tenderness to palpation of the anterior hip and the pelvis. She has some pain with logrolling of the right leg but this is minimal. Distally she is able to flex and bend the toes. Leg lengths appeared equal.   RADIOLOGICAL DATA: Her x-rays were reviewed and there is an inferior ramus fracture on the right side with minimal displacement. There does not appear to be a problem with her prior Sharion Settler prosthesis.   CLINICAL IMPRESSION: Right-sided pelvic fracture in a frail but fairly active an 81 year old.  PLAN: Weight-bearing as tolerated with physical therapy and probably will need rehab placement.   ____________________________ Laurene Footman, MD mjm:rbg D: 12/05/2011 22:07:38 ET T: 12/06/2011 10:44:55 ET JOB#: [id number]  cc: Laurene Footman, MD, <Dictator> Laurene Footman MD ELECTRONICALLY SIGNED 12/06/2011 20:06

## 2015-02-26 NOTE — Consult Note (Signed)
Brief Consult Note: Diagnosis: right rami fracture.   Patient was seen by consultant.   Recommend further assessment or treatment.   Comments: should do well with PT, has been community ambulator with quad cane since hip fracture 2 years ago may need rehab bed prior to going home.  Electronic Signatures: Laurene Footman (MD)  (Signed 31-Jan-13 18:22)  Authored: Brief Consult Note   Last Updated: 31-Jan-13 18:22 by Laurene Footman (MD)

## 2015-02-26 NOTE — Consult Note (Signed)
PATIENT NAME:  Laurie Fuentes, Laurie Fuentes MR#:  [id number] DATE OF BIRTH:  1926/08/02  DATE OF CONSULTATION:  12/04/2011  REFERRING PHYSICIAN:   CONSULTING PHYSICIAN:  Tyrone Schimke, MD  PRIMARY CARE PHYSICIAN: Otilio Miu, MD   EMERGENCY ROOM PHYSICIAN: Francene Castle, MD   CHIEF COMPLAINT: Fall.   HISTORY OF PRESENT ILLNESS: The patient is an 81 year old female with past medical history significant for history of stroke, hypertension, right hip fracture, history of falls. The patient presents with chief complaint of fall around 3 o'clock. The patient was trying to get up and walk in her house. She lost balance and fell. She complained of hip pain. She denies any dizziness, palpitations, or chest pain prior to the fall. Denies any shortness of breath. X-rays in the Emergency Room showed she has nondisplaced right inferior ramus fracture.   PAST MEDICAL HISTORY:  1. Hypertension. 2. Gastroesophageal reflux disease.   3. Stroke. 4. Degenerative joint disease.  5. History of gastritis. 6. Right femoral neck fracture.   PAST SURGICAL HISTORY:  1. Cholecystectomy.  2. Right femoral neck fracture, status post prosthesis. 3. Bilateral cataract surgery.   ALLERGIES: No known drug allergies.   CURRENT MEDICATIONS:  1. Hydrochlorothiazide 25 mg p.o. daily.  2. Prednisone 10 mg p.o. daily.  3. Ranitidine 300 mg p.o. nightly.  4. Tramadol 50 mg p.o. daily.  5. Coumadin 3 mg p.o. daily.   SOCIAL HISTORY: She lives in [place]. Her grandson lives with her. She denied any tobacco abuse, alcohol abuse, or drug abuse.   FAMILY HISTORY: Denies MI, diabetes. She has four children, three are deceased. One had kidney failure; the other two had cancer.   REVIEW OF SYSTEMS: CONSTITUTIONAL: The patient denies any fevers, chills, night sweats. HEENT: The patient denies any hearing loss, dysphagia, visual problems, sore throat. CARDIOVASCULAR: Denies any chest pain, orthopnea, or PND. RESPIRATORY: The  patient denies any cough, wheezing, or hemoptysis. GI: The patient denies any nausea, vomiting, abdominal pain, hematemesis, hematochezia, or melena. GU: The patient denies any hematuria, dysuria, or frequency. NEUROLOGIC: The patient denies any headache, focal weakness or seizures. SKIN: The patient denies any lesions or rash. ENDOCRINE: The patient denies any polyuria, polyphagia, or polydipsia. MUSCULOSKELETAL: The patient denies any arthritis, arthralgias, myalgias, joint swelling, tenderness. HEMATOLOGIC: The patient denies any easy bleeding or bruises.   PHYSICAL EXAMINATION:   VITAL SIGNS: Temperature 96.1, heart rate 73, respiratory rate 18, blood pressure 128/65, oxygen sat 92%.   HEENT: Atraumatic, normocephalic. Pupils equal, round, and reactive to light and accommodation. Extraocular movements intact. Sclerae anicteric. Mucous membranes moist.   NECK: Supple. No organomegaly.   CARDIOVASCULAR: S1, S2, regular rate, rhythm. No gallops. No thrills. No murmurs.   RESPIRATORY: Lungs are clear to auscultation. No rales, no rhonchi, no wheezes, no bronchial breath sounds.   GI: Abdomen is soft, nontender, nondistended. Normal bowel sounds. No hepatosplenomegaly.   GU: There is no hematuria or masses noted.   SKIN: No lesions, no rash.   ENDOCRINE: No masses, no thyromegaly.   LYMPH: No lymphadenopathy or nodes palpable.   NEUROLOGIC: Cranial nerves II through XII grossly intact. Motor strength is 5 out of 5 bilateral upper and lower extremities. Sensation within normal limits. No focal neurological deficits noted on examination.   MUSCULOSKELETAL: No arthritis, joint effusion, or swelling.   HEMATOLOGIC: No ecchymosis, no bleeding, and no petechiae noted.   EXTREMITIES: No cyanosis, no clubbing, no edema. 2+ pedal pulses noted bilaterally.   LABORATORY, DIAGNOSTIC, AND RADIOLOGICAL DATA:  PT 25.1. INR 2.3. Right hip x-ray shows nondisplaced fracture of the right inferior pubic  ramus. Urinalysis is negative, 2 WBCs, 3 RBCs. WBC count 7100, hemoglobin 13.2, hematocrit 39.9, platelet count 183, MCV 89, glucose 96, BUN 16, creatinine 0.75, sodium 134, potassium 3.5, chloride 93, CO2 29, calcium 8.8, estimated GFR is greater than 60.   ASSESSMENT AND PLAN:  1. The patient is an 81 year old female who presents with chief complaint of fall, right inferior ramus fracture. Admit to medical floor. Start morphine for pain control.  2. Hypertension. Continue hydrochlorothiazide.  3. History of DVT. Continue Coumadin. Follow PT-INR per pharmacy protocol.    ____________________________ Tyrone Schimke, MD jsp:drc D: 12/04/2011 23:29:37 ET T: 12/05/2011 08:35:00 ET JOB#: [id number]  cc: Tyrone Schimke, MD, <Dictator> Juline Patch, MD Tyrone Schimke MD ELECTRONICALLY SIGNED 12/05/2011 9:05

## 2015-03-20 DIAGNOSIS — R791 Abnormal coagulation profile: Secondary | ICD-10-CM | POA: Diagnosis not present

## 2015-04-17 ENCOUNTER — Other Ambulatory Visit: Payer: Medicare Other

## 2015-04-17 DIAGNOSIS — Z7901 Long term (current) use of anticoagulants: Secondary | ICD-10-CM | POA: Diagnosis not present

## 2015-04-18 LAB — PROTIME-INR
INR: 2.3 — ABNORMAL HIGH (ref 0.8–1.2)
Prothrombin Time: 24.4 s — ABNORMAL HIGH (ref 9.1–12.0)

## 2015-05-11 ENCOUNTER — Ambulatory Visit
Admission: RE | Admit: 2015-05-11 | Discharge: 2015-05-11 | Disposition: A | Discharge status: Home / Self Care | Payer: Medicare Other | Source: Ambulatory Visit | Attending: Family Medicine | Admitting: Family Medicine

## 2015-05-11 ENCOUNTER — Encounter: Payer: Self-pay | Admitting: Family Medicine

## 2015-05-11 ENCOUNTER — Ambulatory Visit (INDEPENDENT_AMBULATORY_CARE_PROVIDER_SITE_OTHER): Payer: Medicare Other | Admitting: Family Medicine

## 2015-05-11 ENCOUNTER — Other Ambulatory Visit: Payer: Self-pay | Admitting: Family Medicine

## 2015-05-11 VITALS — BP 120/60 | HR 76 | Ht 66.0 in | Wt 166.0 lb

## 2015-05-11 DIAGNOSIS — I639 Cerebral infarction, unspecified: Secondary | ICD-10-CM

## 2015-05-11 DIAGNOSIS — M8580 Other specified disorders of bone density and structure, unspecified site: Secondary | ICD-10-CM | POA: Insufficient documentation

## 2015-05-11 DIAGNOSIS — E876 Hypokalemia: Secondary | ICD-10-CM | POA: Diagnosis not present

## 2015-05-11 DIAGNOSIS — M5136 Other intervertebral disc degeneration, lumbar region: Secondary | ICD-10-CM | POA: Insufficient documentation

## 2015-05-11 DIAGNOSIS — M25551 Pain in right hip: Secondary | ICD-10-CM | POA: Insufficient documentation

## 2015-05-11 DIAGNOSIS — K219 Gastro-esophageal reflux disease without esophagitis: Secondary | ICD-10-CM

## 2015-05-11 DIAGNOSIS — Z96641 Presence of right artificial hip joint: Secondary | ICD-10-CM | POA: Insufficient documentation

## 2015-05-11 DIAGNOSIS — I10 Essential (primary) hypertension: Secondary | ICD-10-CM

## 2015-05-11 DIAGNOSIS — M8588 Other specified disorders of bone density and structure, other site: Secondary | ICD-10-CM | POA: Diagnosis not present

## 2015-05-11 MED ORDER — WARFARIN SODIUM 2 MG PO TABS: 2.0000 mg | ORAL_TABLET | Freq: Every day | ORAL | Status: DC

## 2015-05-11 MED ORDER — AMLODIPINE BESYLATE 5 MG PO TABS: 5.0000 mg | ORAL_TABLET | Freq: Every day | ORAL | Status: DC

## 2015-05-11 MED ORDER — HYDROCHLOROTHIAZIDE 25 MG PO TABS: 25.0000 mg | ORAL_TABLET | Freq: Every day | ORAL | Status: DC

## 2015-05-11 MED ORDER — POTASSIUM CHLORIDE CRYS ER 20 MEQ PO TBCR: 20.0000 meq | EXTENDED_RELEASE_TABLET | Freq: Two times a day (BID) | ORAL | Status: DC

## 2015-05-11 MED ORDER — RANITIDINE HCL 300 MG PO TABS: 300.0000 mg | ORAL_TABLET | Freq: Every day | ORAL | Status: DC

## 2015-05-11 MED ORDER — WARFARIN SODIUM 3 MG PO TABS: 3.0000 mg | ORAL_TABLET | Freq: Every day | ORAL | Status: DC

## 2015-05-11 NOTE — Progress Notes (Signed)
Name: Laurie Fuentes   MRN: [id number]    DOB: Aug 24, 1926   Date:05/11/2015       Progress Note  Subjective  Chief Complaint  Chief Complaint  Patient presents with  . Hip Pain    started hurting about a week ago    Hip Pain  The incident occurred 5 to 7 days ago. The pain is present in the right hip. The quality of the pain is described as aching. The pain is moderate. The pain has been constant since onset. Associated symptoms include a loss of motion. Pertinent negatives include no inability to bear weight, loss of sensation, muscle weakness or tingling. She reports no foreign bodies present. The symptoms are aggravated by movement. The treatment provided no relief.  Hypertension This is a recurrent problem. The current episode started more than 1 year ago. The problem is unchanged. Associated symptoms include peripheral edema. Pertinent negatives include no blurred vision, chest pain, headaches, malaise/fatigue, neck pain, orthopnea, palpitations or shortness of breath. There are no associated agents to hypertension. There are no known risk factors for coronary artery disease. Past treatments include diuretics and calcium channel blockers. The current treatment provides moderate improvement. There are no compliance problems.  There is no history of angina, kidney disease, CAD/MI, CVA, heart failure, left ventricular hypertrophy, PVD or retinopathy. There is no history of chronic renal disease.  Gastrophageal Reflux She reports no abdominal pain, no chest pain, no coughing, no heartburn, no nausea, no sore throat or no wheezing. This is a recurrent problem. The current episode started more than 1 year ago. The problem occurs frequently. The problem has been gradually improving. Nothing aggravates the symptoms. Pertinent negatives include no anemia, fatigue, melena, muscle weakness, orthopnea or weight loss. There are no known risk factors. The treatment provided mild relief.  Neurologic  Problem The patient's primary symptoms include clumsiness. The patient's pertinent negatives include no altered mental status, focal weakness or loss of balance. This is a new (hip with previous surgery) problem. There was right-sided focality noted. Pertinent negatives include no abdominal pain, back pain, chest pain, dizziness, fatigue, fever, headaches, nausea, neck pain, palpitations or shortness of breath. Past treatments include acetaminophen. Her past medical history is significant for a CVA and dementia. There is no history of a bleeding disorder, a clotting disorder, head trauma, liver disease, mood changes or seizures.    No problem-specific assessment & plan notes found for this encounter.   Past Medical History  Diagnosis Date  . Hypertension   . GERD (gastroesophageal reflux disease)   . Hypokalemia   . Stroke     Past Surgical History  Procedure Laterality Date  . Hip fracture surgery Right   . Gallbladder surgery      Family History  Problem Relation Age of Onset  . Diabetes Sister     History   Social History  . Marital Status: Widowed    Spouse Name: N/A  . Number of Children: N/A  . Years of Education: N/A   Occupational History  . Not on file.   Social History Main Topics  . Smoking status: Never Smoker   . Smokeless tobacco: Not on file  . Alcohol Use: No  . Drug Use: No  . Sexual Activity: Not Currently   Other Topics Concern  . Not on file   Social History Narrative  . No narrative on file    No Known Allergies   Review of Systems  Constitutional: Negative for fever, chills, weight  loss, malaise/fatigue and fatigue.  HENT: Negative for ear discharge, ear pain and sore throat.   Eyes: Negative for blurred vision.  Respiratory: Negative for cough, sputum production, shortness of breath and wheezing.   Cardiovascular: Negative for chest pain, palpitations, orthopnea and leg swelling.  Gastrointestinal: Negative for heartburn, nausea,  abdominal pain, diarrhea, constipation, blood in stool and melena.  Genitourinary: Negative for dysuria, urgency, frequency and hematuria.  Musculoskeletal: Negative for myalgias, back pain, joint pain, muscle weakness and neck pain.  Skin: Negative for rash.  Neurological: Negative for dizziness, tingling, sensory change, focal weakness, headaches and loss of balance.  Endo/Heme/Allergies: Negative for environmental allergies and polydipsia. Does not bruise/bleed easily.  Psychiatric/Behavioral: Negative for depression and suicidal ideas. The patient is not nervous/anxious and does not have insomnia.      Objective  Filed Vitals:   05/11/15 1349  BP: 120/60  Pulse: 76  Height: 5\' 6"  (1.676 m)  Weight: 166 lb (75.297 kg)    Physical Exam  Constitutional: She is well-developed, well-nourished, and in no distress. No distress.  HENT:  Head: Normocephalic and atraumatic.  Right Ear: External ear normal.  Left Ear: External ear normal.  Nose: Nose normal.  Mouth/Throat: Oropharynx is clear and moist.  Eyes: Conjunctivae and EOM are normal. Pupils are equal, round, and reactive to light. Right eye exhibits no discharge. Left eye exhibits no discharge.  Neck: Normal range of motion. Neck supple. No JVD present. No thyromegaly present.  Cardiovascular: Normal rate, regular rhythm, normal heart sounds and intact distal pulses.  Exam reveals no gallop and no friction rub.   No murmur heard. Pulmonary/Chest: Effort normal and breath sounds normal.  Abdominal: Soft. Bowel sounds are normal. She exhibits no mass. There is no tenderness. There is no guarding.  Musculoskeletal: Normal range of motion. She exhibits no edema.  Lymphadenopathy:    She has no cervical adenopathy.  Neurological: She is alert. She has normal reflexes.  Skin: Skin is warm and dry. She is not diaphoretic.  Psychiatric: Mood and affect normal.      Assessment & Plan  Problem List Items Addressed This Visit     None    Visit Diagnoses    Hip pain, right    -  Primary    Relevant Orders    DG Arthro Hip Right    Essential hypertension        Relevant Medications    amLODipine (NORVASC) 5 MG tablet    hydrochlorothiazide (HYDRODIURIL) 25 MG tablet    warfarin (COUMADIN) 2 MG tablet    warfarin (COUMADIN) 3 MG tablet    Other Relevant Orders    Renal Function Panel    Stroke        Relevant Medications    amLODipine (NORVASC) 5 MG tablet    hydrochlorothiazide (HYDRODIURIL) 25 MG tablet    warfarin (COUMADIN) 2 MG tablet    warfarin (COUMADIN) 3 MG tablet    Other Relevant Orders    INR/PT    Gastroesophageal reflux disease, esophagitis presence not specified        Relevant Medications    ranitidine (ZANTAC) 300 MG tablet    Hypokalemia due to loss of potassium        Relevant Medications    potassium chloride SA (K-DUR,KLOR-CON) 20 MEQ tablet    Other Relevant Orders    Renal Function Panel         Dr. Otilio Miu [facility]  05/11/2015

## 2015-05-11 NOTE — Addendum Note (Signed)
Addended by: Juline Patch on: 05/11/2015 03:16 PM   Modules accepted: Orders

## 2015-05-12 LAB — PROTIME-INR
INR: 2.1 — ABNORMAL HIGH (ref 0.8–1.2)
Prothrombin Time: 21.8 s — ABNORMAL HIGH (ref 9.1–12.0)

## 2015-05-12 LAB — RENAL FUNCTION PANEL
Albumin: 4.2 g/dL (ref 3.5–4.7)
BUN/Creatinine Ratio: 19 (ref 11–26)
BUN: 15 mg/dL (ref 8–27)
CALCIUM: 9.6 mg/dL (ref 8.7–10.3)
CHLORIDE: 93 mmol/L — AB (ref 97–108)
CO2: 27 mmol/L (ref 18–29)
Creatinine, Ser: 0.8 mg/dL (ref 0.57–1.00)
GFR calc Af Amer: 77 mL/min/{1.73_m2} (ref >59)
GFR calc non Af Amer: 67 mL/min/{1.73_m2} (ref >59)
GLUCOSE: 116 mg/dL — AB (ref 65–99)
POTASSIUM: 4 mmol/L (ref 3.5–5.2)
Phosphorus: 3.4 mg/dL (ref 2.5–4.5)
SODIUM: 138 mmol/L (ref 134–144)

## 2015-05-17 ENCOUNTER — Other Ambulatory Visit: Payer: Self-pay

## 2015-05-18 ENCOUNTER — Other Ambulatory Visit: Payer: Self-pay

## 2015-05-18 DIAGNOSIS — M25551 Pain in right hip: Secondary | ICD-10-CM

## 2015-05-19 DIAGNOSIS — S329XXA Fracture of unspecified parts of lumbosacral spine and pelvis, initial encounter for closed fracture: Secondary | ICD-10-CM | POA: Diagnosis not present

## 2015-05-19 DIAGNOSIS — Z96641 Presence of right artificial hip joint: Secondary | ICD-10-CM | POA: Diagnosis not present

## 2015-06-01 DIAGNOSIS — M25551 Pain in right hip: Secondary | ICD-10-CM | POA: Diagnosis not present

## 2015-06-01 DIAGNOSIS — Z96641 Presence of right artificial hip joint: Secondary | ICD-10-CM | POA: Diagnosis not present

## 2015-06-02 ENCOUNTER — Other Ambulatory Visit: Payer: Self-pay | Admitting: Orthopedic Surgery

## 2015-06-02 DIAGNOSIS — M25551 Pain in right hip: Secondary | ICD-10-CM

## 2015-06-07 ENCOUNTER — Inpatient Hospital Stay: Admission: RE | Admit: 2015-06-07 | Payer: Medicare Other | Source: Ambulatory Visit

## 2015-06-07 ENCOUNTER — Encounter: Admission: RE | Admit: 2015-06-07 | Payer: Medicare Other | Source: Ambulatory Visit

## 2015-06-07 ENCOUNTER — Other Ambulatory Visit: Payer: Self-pay | Admitting: Orthopedic Surgery

## 2015-06-07 ENCOUNTER — Ambulatory Visit
Admission: RE | Admit: 2015-06-07 | Discharge: 2015-06-07 | Disposition: A | Discharge status: Home / Self Care | Payer: Medicare Other | Source: Ambulatory Visit | Attending: Orthopedic Surgery | Admitting: Orthopedic Surgery

## 2015-06-07 ENCOUNTER — Encounter
Admission: RE | Admit: 2015-06-07 | Discharge: 2015-06-07 | Disposition: A | Discharge status: Home / Self Care | Payer: Medicare Other | Source: Ambulatory Visit | Attending: Orthopedic Surgery | Admitting: Orthopedic Surgery

## 2015-06-07 DIAGNOSIS — M25551 Pain in right hip: Secondary | ICD-10-CM

## 2015-06-07 DIAGNOSIS — Z96641 Presence of right artificial hip joint: Secondary | ICD-10-CM | POA: Diagnosis not present

## 2015-06-07 MED ORDER — TECHNETIUM TC 99M MEDRONATE IV KIT
25.0000 | PACK | Freq: Once | INTRAVENOUS | Status: AC | PRN
  Administered 2015-06-07: 21.749 via INTRAVENOUS

## 2015-06-14 ENCOUNTER — Ambulatory Visit (INDEPENDENT_AMBULATORY_CARE_PROVIDER_SITE_OTHER): Payer: Medicare Other | Admitting: Family Medicine

## 2015-06-14 ENCOUNTER — Encounter: Payer: Self-pay | Admitting: Family Medicine

## 2015-06-14 ENCOUNTER — Other Ambulatory Visit: Payer: Medicare Other

## 2015-06-14 VITALS — BP 110/60 | HR 60 | Ht 66.0 in | Wt 170.0 lb

## 2015-06-14 DIAGNOSIS — I639 Cerebral infarction, unspecified: Secondary | ICD-10-CM | POA: Diagnosis not present

## 2015-06-14 DIAGNOSIS — E876 Hypokalemia: Secondary | ICD-10-CM

## 2015-06-14 DIAGNOSIS — I87009 Postthrombotic syndrome without complications of unspecified extremity: Secondary | ICD-10-CM | POA: Diagnosis not present

## 2015-06-14 DIAGNOSIS — E089 Diabetes mellitus due to underlying condition without complications: Secondary | ICD-10-CM | POA: Diagnosis not present

## 2015-06-14 DIAGNOSIS — K219 Gastro-esophageal reflux disease without esophagitis: Secondary | ICD-10-CM | POA: Diagnosis not present

## 2015-06-14 DIAGNOSIS — Z7901 Long term (current) use of anticoagulants: Secondary | ICD-10-CM

## 2015-06-14 DIAGNOSIS — I10 Essential (primary) hypertension: Secondary | ICD-10-CM | POA: Diagnosis not present

## 2015-06-14 MED ORDER — HYDROCHLOROTHIAZIDE 25 MG PO TABS: 25.0000 mg | ORAL_TABLET | Freq: Every day | ORAL | Status: DC

## 2015-06-14 MED ORDER — WARFARIN SODIUM 2 MG PO TABS: 2.0000 mg | ORAL_TABLET | Freq: Every day | ORAL | Status: DC

## 2015-06-14 MED ORDER — POTASSIUM CHLORIDE CRYS ER 20 MEQ PO TBCR: 20.0000 meq | EXTENDED_RELEASE_TABLET | Freq: Two times a day (BID) | ORAL | Status: DC

## 2015-06-14 MED ORDER — WARFARIN SODIUM 3 MG PO TABS: 3.0000 mg | ORAL_TABLET | Freq: Every day | ORAL | Status: DC

## 2015-06-14 MED ORDER — AMLODIPINE BESYLATE 5 MG PO TABS: 5.0000 mg | ORAL_TABLET | Freq: Every day | ORAL | Status: DC

## 2015-06-14 MED ORDER — RANITIDINE HCL 300 MG PO TABS: 300.0000 mg | ORAL_TABLET | Freq: Every day | ORAL | Status: DC

## 2015-06-14 NOTE — Progress Notes (Signed)
Name: Laurie Fuentes   MRN: [id number]    DOB: 1926-01-01   Date:06/14/2015       Progress Note  Subjective  Chief Complaint  Chief Complaint  Patient presents with  . Hypertension  . Gastrophageal Reflux    Hypertension This is a chronic problem. The current episode started more than 1 year ago. The problem has been gradually improving since onset. The problem is controlled. Pertinent negatives include no anxiety, blurred vision, chest pain, headaches, malaise/fatigue, neck pain, orthopnea, palpitations, peripheral edema, PND, shortness of breath or sweats. There are no associated agents to hypertension. Risk factors for coronary artery disease include sedentary lifestyle. Past treatments include diuretics and calcium channel blockers. The current treatment provides mild improvement. There are no compliance problems.  There is no history of angina, kidney disease, CAD/MI, CVA, heart failure, left ventricular hypertrophy, PVD, renovascular disease or retinopathy. There is no history of chronic renal disease.  Gastrophageal Reflux She reports no abdominal pain, no belching, no chest pain, no choking, no coughing, no dysphagia, no early satiety, no globus sensation, no heartburn, no hoarse voice, no nausea, no sore throat, no stridor, no tooth decay, no water brash or no wheezing. This is a chronic problem. The current episode started more than 1 year ago. The problem occurs frequently. The problem has been gradually improving. Nothing aggravates the symptoms. Pertinent negatives include no anemia, fatigue, melena, muscle weakness, orthopnea or weight loss. There are no known risk factors. She has tried a PPI for the symptoms. The treatment provided mild relief.    No problem-specific assessment & plan notes found for this encounter.   Past Medical History  Diagnosis Date  . Hypertension   . GERD (gastroesophageal reflux disease)   . Hypokalemia   . Stroke     Past Surgical History   Procedure Laterality Date  . Hip fracture surgery Right   . Gallbladder surgery      Family History  Problem Relation Age of Onset  . Diabetes Sister     Social History   Social History  . Marital Status: Widowed    Spouse Name: N/A  . Number of Children: N/A  . Years of Education: N/A   Occupational History  . Not on file.   Social History Main Topics  . Smoking status: Never Smoker   . Smokeless tobacco: Not on file  . Alcohol Use: No  . Drug Use: No  . Sexual Activity: No   Other Topics Concern  . Not on file   Social History Narrative    No Known Allergies   Review of Systems  Constitutional: Negative for fever, chills, weight loss, malaise/fatigue and fatigue.  HENT: Negative for ear discharge, ear pain, hoarse voice and sore throat.   Eyes: Negative for blurred vision.  Respiratory: Negative for cough, sputum production, choking, shortness of breath and wheezing.   Cardiovascular: Negative for chest pain, palpitations, orthopnea, leg swelling and PND.  Gastrointestinal: Negative for heartburn, dysphagia, nausea, abdominal pain, diarrhea, constipation, blood in stool and melena.  Genitourinary: Negative for dysuria, urgency, frequency and hematuria.  Musculoskeletal: Negative for myalgias, back pain, joint pain, muscle weakness and neck pain.  Skin: Negative for rash.  Neurological: Negative for dizziness, tingling, sensory change, focal weakness and headaches.  Endo/Heme/Allergies: Negative for environmental allergies and polydipsia. Does not bruise/bleed easily.  Psychiatric/Behavioral: Negative for depression and suicidal ideas. The patient is not nervous/anxious and does not have insomnia.      Objective  Filed Vitals:  06/14/15 1340  BP: 110/60  Pulse: 60  Height: 5\' 6"  (1.676 m)  Weight: 170 lb (77.111 kg)    Physical Exam  Constitutional: She is well-developed, well-nourished, and in no distress. No distress.  HENT:  Head: Normocephalic  and atraumatic.  Right Ear: External ear normal.  Left Ear: External ear normal.  Nose: Nose normal.  Mouth/Throat: Oropharynx is clear and moist.  Eyes: Conjunctivae and EOM are normal. Pupils are equal, round, and reactive to light. Right eye exhibits no discharge. Left eye exhibits no discharge.  Neck: Normal range of motion. Neck supple. No JVD present. No thyromegaly present.  Cardiovascular: Normal rate, regular rhythm, normal heart sounds and intact distal pulses.  Exam reveals no gallop and no friction rub.   No murmur heard. Pulmonary/Chest: Effort normal and breath sounds normal.  Abdominal: Soft. Bowel sounds are normal. She exhibits no mass. There is no tenderness. There is no guarding.  Musculoskeletal: Normal range of motion. She exhibits no edema.  Lymphadenopathy:    She has no cervical adenopathy.  Neurological: She is alert. She has normal reflexes.  Skin: Skin is warm and dry. She is not diaphoretic.  Psychiatric: Mood and affect normal.  Nursing note and vitals reviewed.     Assessment & Plan  Problem List Items Addressed This Visit      Digestive   GERD (gastroesophageal reflux disease)   Relevant Medications   ranitidine (ZANTAC) 300 MG tablet    Other Visit Diagnoses    Essential hypertension    -  Primary    Relevant Medications    amLODipine (NORVASC) 5 MG tablet    hydrochlorothiazide (HYDRODIURIL) 25 MG tablet    warfarin (COUMADIN) 2 MG tablet    warfarin (COUMADIN) 3 MG tablet    Chronic anticoagulation        Hypokalemia due to loss of potassium        Relevant Medications    potassium chloride SA (K-DUR,KLOR-CON) 20 MEQ tablet    Stroke        Relevant Medications    amLODipine (NORVASC) 5 MG tablet    hydrochlorothiazide (HYDRODIURIL) 25 MG tablet    warfarin (COUMADIN) 2 MG tablet    warfarin (COUMADIN) 3 MG tablet    Postphlebitic syndrome        Relevant Medications    amLODipine (NORVASC) 5 MG tablet    hydrochlorothiazide  (HYDRODIURIL) 25 MG tablet    warfarin (COUMADIN) 2 MG tablet    warfarin (COUMADIN) 3 MG tablet         Dr. Ronica [facility]  06/14/2015

## 2015-06-15 LAB — RENAL FUNCTION PANEL
Albumin: 4.4 g/dL (ref 3.5–4.7)
BUN/Creatinine Ratio: 19 (ref 11–26)
BUN: 15 mg/dL (ref 8–27)
CALCIUM: 9.3 mg/dL (ref 8.7–10.3)
CO2: 28 mmol/L (ref 18–29)
CREATININE: 0.77 mg/dL (ref 0.57–1.00)
Chloride: 87 mmol/L — ABNORMAL LOW (ref 97–108)
GFR calc Af Amer: 81 mL/min/{1.73_m2} (ref >59)
GFR calc non Af Amer: 70 mL/min/{1.73_m2} (ref >59)
Glucose: 101 mg/dL — ABNORMAL HIGH (ref 65–99)
Phosphorus: 3.2 mg/dL (ref 2.5–4.5)
Potassium: 3.7 mmol/L (ref 3.5–5.2)
Sodium: 133 mmol/L — ABNORMAL LOW (ref 134–144)

## 2015-06-15 LAB — HEMOGLOBIN A1C
Est. average glucose Bld gHb Est-mCnc: 117 mg/dL
HEMOGLOBIN A1C: 5.7 % — AB (ref 4.8–5.6)

## 2015-06-15 LAB — LIPID PANEL
CHOLESTEROL TOTAL: 176 mg/dL (ref 100–199)
Chol/HDL Ratio: 3.3 ratio units (ref 0.0–4.4)
HDL: 53 mg/dL (ref >39)
LDL CALC: 94 mg/dL (ref 0–99)
Triglycerides: 146 mg/dL (ref 0–149)
VLDL CHOLESTEROL CAL: 29 mg/dL (ref 5–40)

## 2015-06-15 LAB — PROTIME-INR
INR: 2.5 — AB (ref 0.8–1.2)
Prothrombin Time: 25.9 s — ABNORMAL HIGH (ref 9.1–12.0)

## 2015-06-19 DIAGNOSIS — Z96641 Presence of right artificial hip joint: Secondary | ICD-10-CM | POA: Diagnosis not present

## 2015-06-19 DIAGNOSIS — M84351D Stress fracture, right femur, subsequent encounter for fracture with routine healing: Secondary | ICD-10-CM | POA: Diagnosis not present

## 2015-06-22 ENCOUNTER — Other Ambulatory Visit: Payer: Self-pay

## 2015-06-22 DIAGNOSIS — E876 Hypokalemia: Secondary | ICD-10-CM

## 2015-06-22 DIAGNOSIS — Z7901 Long term (current) use of anticoagulants: Secondary | ICD-10-CM

## 2015-06-22 MED ORDER — POTASSIUM CHLORIDE CRYS ER 20 MEQ PO TBCR: 20.0000 meq | EXTENDED_RELEASE_TABLET | Freq: Two times a day (BID) | ORAL | Status: DC

## 2015-06-26 ENCOUNTER — Other Ambulatory Visit: Payer: Self-pay

## 2015-07-11 ENCOUNTER — Other Ambulatory Visit: Payer: Medicare Other

## 2015-07-11 DIAGNOSIS — M84351D Stress fracture, right femur, subsequent encounter for fracture with routine healing: Secondary | ICD-10-CM | POA: Diagnosis not present

## 2015-07-11 DIAGNOSIS — M899 Disorder of bone, unspecified: Secondary | ICD-10-CM | POA: Diagnosis not present

## 2015-07-11 DIAGNOSIS — Z7901 Long term (current) use of anticoagulants: Secondary | ICD-10-CM | POA: Diagnosis not present

## 2015-07-12 LAB — PROTIME-INR
INR: 3.2 — ABNORMAL HIGH (ref 0.8–1.2)
Prothrombin Time: 32.8 s — ABNORMAL HIGH (ref 9.1–12.0)

## 2015-07-25 ENCOUNTER — Other Ambulatory Visit: Payer: Medicare Other

## 2015-07-25 DIAGNOSIS — Z7901 Long term (current) use of anticoagulants: Secondary | ICD-10-CM

## 2015-07-26 ENCOUNTER — Other Ambulatory Visit: Payer: Self-pay

## 2015-07-26 LAB — PROTIME-INR
INR: 2.8 — ABNORMAL HIGH (ref 0.8–1.2)
Prothrombin Time: 28.5 s — ABNORMAL HIGH (ref 9.1–12.0)

## 2015-08-08 DIAGNOSIS — M84351D Stress fracture, right femur, subsequent encounter for fracture with routine healing: Secondary | ICD-10-CM | POA: Diagnosis not present

## 2015-08-08 DIAGNOSIS — M898X5 Other specified disorders of bone, thigh: Secondary | ICD-10-CM | POA: Diagnosis not present

## 2015-08-22 ENCOUNTER — Other Ambulatory Visit: Payer: Medicare Other

## 2015-08-22 DIAGNOSIS — Z7901 Long term (current) use of anticoagulants: Secondary | ICD-10-CM

## 2015-08-23 LAB — PROTIME-INR
INR: 2.4 — ABNORMAL HIGH (ref 0.8–1.2)
PROTHROMBIN TIME: 24.3 s — AB (ref 9.1–12.0)

## 2015-09-13 ENCOUNTER — Other Ambulatory Visit: Payer: Medicare Other

## 2015-09-13 DIAGNOSIS — Z7901 Long term (current) use of anticoagulants: Secondary | ICD-10-CM

## 2015-09-14 LAB — PROTIME-INR
INR: 3 — ABNORMAL HIGH (ref 0.8–1.2)
Prothrombin Time: 30.2 s — ABNORMAL HIGH (ref 9.1–12.0)

## 2015-09-26 ENCOUNTER — Other Ambulatory Visit: Payer: Medicare Other

## 2015-09-26 DIAGNOSIS — Z7901 Long term (current) use of anticoagulants: Secondary | ICD-10-CM | POA: Diagnosis not present

## 2015-09-27 LAB — PROTIME-INR
INR: 2.6 — AB (ref 0.8–1.2)
PROTHROMBIN TIME: 26.9 s — AB (ref 9.1–12.0)

## 2015-10-24 ENCOUNTER — Other Ambulatory Visit: Payer: Medicare Other

## 2015-10-24 DIAGNOSIS — Z7901 Long term (current) use of anticoagulants: Secondary | ICD-10-CM | POA: Diagnosis not present

## 2015-10-25 LAB — PROTIME-INR
INR: 2.3 — ABNORMAL HIGH (ref 0.8–1.2)
Prothrombin Time: 23.5 s — ABNORMAL HIGH (ref 9.1–12.0)

## 2015-11-29 ENCOUNTER — Ambulatory Visit (INDEPENDENT_AMBULATORY_CARE_PROVIDER_SITE_OTHER): Payer: Medicare Other | Admitting: Family Medicine

## 2015-11-29 ENCOUNTER — Encounter: Payer: Self-pay | Admitting: Family Medicine

## 2015-11-29 VITALS — BP 110/70 | HR 78 | Ht 66.0 in | Wt 167.0 lb

## 2015-11-29 DIAGNOSIS — E876 Hypokalemia: Secondary | ICD-10-CM

## 2015-11-29 DIAGNOSIS — Z7901 Long term (current) use of anticoagulants: Secondary | ICD-10-CM | POA: Diagnosis not present

## 2015-11-29 DIAGNOSIS — K219 Gastro-esophageal reflux disease without esophagitis: Secondary | ICD-10-CM

## 2015-11-29 DIAGNOSIS — I10 Essential (primary) hypertension: Secondary | ICD-10-CM | POA: Diagnosis not present

## 2015-11-29 DIAGNOSIS — I82409 Acute embolism and thrombosis of unspecified deep veins of unspecified lower extremity: Secondary | ICD-10-CM

## 2015-11-29 DIAGNOSIS — Z23 Encounter for immunization: Secondary | ICD-10-CM | POA: Diagnosis not present

## 2015-11-29 DIAGNOSIS — I679 Cerebrovascular disease, unspecified: Secondary | ICD-10-CM

## 2015-11-29 MED ORDER — HYDROCHLOROTHIAZIDE 25 MG PO TABS: 25.0000 mg | ORAL_TABLET | Freq: Every day | ORAL | Status: DC

## 2015-11-29 MED ORDER — RANITIDINE HCL 300 MG PO TABS: 300.0000 mg | ORAL_TABLET | Freq: Every day | ORAL | Status: DC

## 2015-11-29 MED ORDER — WARFARIN SODIUM 2 MG PO TABS: 2.0000 mg | ORAL_TABLET | Freq: Every day | ORAL | Status: DC

## 2015-11-29 MED ORDER — AMLODIPINE BESYLATE 5 MG PO TABS: 5.0000 mg | ORAL_TABLET | Freq: Every day | ORAL | Status: DC

## 2015-11-29 MED ORDER — POTASSIUM CHLORIDE CRYS ER 20 MEQ PO TBCR: 20.0000 meq | EXTENDED_RELEASE_TABLET | Freq: Two times a day (BID) | ORAL | Status: DC

## 2015-11-29 MED ORDER — WARFARIN SODIUM 3 MG PO TABS: 3.0000 mg | ORAL_TABLET | Freq: Every day | ORAL | Status: DC

## 2015-11-29 NOTE — Progress Notes (Signed)
Name: Laurie Fuentes   MRN: [id number]    DOB: 1926-08-15   Date:11/29/2015       Progress Note  Subjective  Chief Complaint  Chief Complaint  Patient presents with  . Gastroesophageal Reflux  . Hypertension  . hypokalemia  . Cerebrovascular Accident    takes warfarin for maintenance    Gastroesophageal Reflux She complains of belching and heartburn. She reports no abdominal pain, no chest pain, no choking, no coughing, no dysphagia, no early satiety, no globus sensation, no hoarse voice, no nausea, no sore throat, no stridor, no tooth decay, no water brash or no wheezing. This is a chronic problem. The current episode started more than 1 year ago. The problem occurs frequently. The problem has been waxing and waning. The heartburn duration is several minutes. The heartburn is of mild intensity. The heartburn does not wake her from sleep. The heartburn does not limit her activity. The heartburn doesn't change with position. The symptoms are aggravated by certain foods. Pertinent negatives include no anemia, fatigue, melena, muscle weakness, orthopnea or weight loss. She has tried a histamine-2 antagonist for the symptoms. The treatment provided moderate relief.  Hypertension This is a chronic problem. The current episode started more than 1 year ago. The problem has been waxing and waning since onset. Pertinent negatives include no blurred vision, chest pain, headaches, malaise/fatigue, neck pain, orthopnea, palpitations, peripheral edema or shortness of breath. There are no associated agents to hypertension. Risk factors for coronary artery disease include obesity. Past treatments include calcium channel blockers and diuretics. There is no history of angina, kidney disease, CAD/MI, CVA, heart failure, left ventricular hypertrophy, PVD, renovascular disease or retinopathy. There is no history of chronic renal disease or a hypertension causing med.  Cerebrovascular Accident This is a chronic  problem. The current episode started more than 1 year ago. The problem has been unchanged. Pertinent negatives include no abdominal pain, anorexia, chest pain, chills, coughing, fatigue, fever, headaches, joint swelling, myalgias, nausea, neck pain, rash, sore throat, vertigo or vomiting. Nothing aggravates the symptoms. The treatment provided mild relief.    No problem-specific assessment & plan notes found for this encounter.   Past Medical History  Diagnosis Date  . Hypertension   . GERD (gastroesophageal reflux disease)   . Hypokalemia   . Stroke [facility])     Past Surgical History  Procedure Laterality Date  . Hip fracture surgery Right   . Gallbladder surgery      Family History  Problem Relation Age of Onset  . Diabetes Sister     Social History   Social History  . Marital Status: Widowed    Spouse Name: N/A  . Number of Children: N/A  . Years of Education: N/A   Occupational History  . Not on file.   Social History Main Topics  . Smoking status: Never Smoker   . Smokeless tobacco: Not on file  . Alcohol Use: No  . Drug Use: No  . Sexual Activity: No   Other Topics Concern  . Not on file   Social History Narrative    No Known Allergies   Review of Systems  Constitutional: Negative for fever, chills, weight loss, malaise/fatigue and fatigue.  HENT: Negative for ear discharge, ear pain, hoarse voice and sore throat.   Eyes: Negative for blurred vision.  Respiratory: Negative for cough, sputum production, choking, shortness of breath and wheezing.   Cardiovascular: Negative for chest pain, palpitations, orthopnea and leg swelling.  Gastrointestinal: Positive for  heartburn. Negative for dysphagia, nausea, vomiting, abdominal pain, diarrhea, constipation, blood in stool, melena and anorexia.  Genitourinary: Negative for dysuria, urgency, frequency and hematuria.  Musculoskeletal: Negative for myalgias, back pain, joint pain, joint swelling, muscle weakness  and neck pain.  Skin: Negative for rash.  Neurological: Negative for dizziness, vertigo, tingling, sensory change, focal weakness and headaches.  Endo/Heme/Allergies: Negative for environmental allergies and polydipsia. Does not bruise/bleed easily.  Psychiatric/Behavioral: Negative for depression and suicidal ideas. The patient is not nervous/anxious and does not have insomnia.      Objective  Filed Vitals:   11/29/15 0926  BP: 110/70  Pulse: 78  Height: 5\' 6"  (1.676 m)  Weight: 167 lb (75.751 kg)    Physical Exam  Constitutional: She is well-developed, well-nourished, and in no distress. No distress.  HENT:  Head: Normocephalic and atraumatic.  Right Ear: External ear normal.  Left Ear: External ear normal.  Nose: Nose normal.  Mouth/Throat: Oropharynx is clear and moist.  Eyes: Conjunctivae and EOM are normal. Pupils are equal, round, and reactive to light. Right eye exhibits no discharge. Left eye exhibits no discharge.  Neck: Normal range of motion. Neck supple. No JVD present. No thyromegaly present.  Cardiovascular: Normal rate, regular rhythm, normal heart sounds and intact distal pulses.  Exam reveals no gallop and no friction rub.   No murmur heard. Pulmonary/Chest: Effort normal and breath sounds normal. No respiratory distress. She has no wheezes. She has no rales. She exhibits no tenderness.  Abdominal: Soft. Bowel sounds are normal. She exhibits no mass. There is no hepatosplenomegaly. There is no tenderness. There is no guarding.  Musculoskeletal: Normal range of motion. She exhibits no edema.  Lymphadenopathy:    She has no cervical adenopathy.  Neurological: She is alert.  Skin: Skin is warm and dry. She is not diaphoretic.  Psychiatric: Mood and affect normal.  Nursing note and vitals reviewed.     Assessment & Plan  Problem List Items Addressed This Visit      Cardiovascular and Mediastinum   Deep vein thrombosis of lower extremity (HCC)   Relevant  Medications   amLODipine (NORVASC) 5 MG tablet   hydrochlorothiazide (HYDRODIURIL) 25 MG tablet   warfarin (COUMADIN) 2 MG tablet   warfarin (COUMADIN) 3 MG tablet   Essential hypertension - Primary   Relevant Medications   amLODipine (NORVASC) 5 MG tablet   hydrochlorothiazide (HYDRODIURIL) 25 MG tablet   warfarin (COUMADIN) 2 MG tablet   warfarin (COUMADIN) 3 MG tablet   Other Relevant Orders   Renal Function Panel   Cerebral vascular disease   Relevant Medications   amLODipine (NORVASC) 5 MG tablet   hydrochlorothiazide (HYDRODIURIL) 25 MG tablet   warfarin (COUMADIN) 2 MG tablet   warfarin (COUMADIN) 3 MG tablet     Digestive   GERD (gastroesophageal reflux disease)   Relevant Medications   ranitidine (ZANTAC) 300 MG tablet    Other Visit Diagnoses    Hypokalemia due to loss of potassium        Relevant Medications    potassium chloride SA (K-DUR,KLOR-CON) 20 MEQ tablet    Hypokalemia        Anticoagulant long-term use        Relevant Orders    INR/PT    Need for pneumococcal vaccination        Relevant Orders    Pneumococcal conjugate vaccine 13-valent (Completed)         Dr. [facility]  11/29/2015

## 2015-11-30 LAB — RENAL FUNCTION PANEL
Albumin: 4.3 g/dL (ref 3.5–4.7)
BUN / CREAT RATIO: 16 (ref 11–26)
BUN: 13 mg/dL (ref 8–27)
CO2: 25 mmol/L (ref 18–29)
CREATININE: 0.82 mg/dL (ref 0.57–1.00)
Calcium: 9.5 mg/dL (ref 8.7–10.3)
Chloride: 90 mmol/L — ABNORMAL LOW (ref 96–106)
GFR calc non Af Amer: 65 mL/min/{1.73_m2} (ref >59)
GFR, EST AFRICAN AMERICAN: 74 mL/min/{1.73_m2} (ref >59)
Glucose: 105 mg/dL — ABNORMAL HIGH (ref 65–99)
Phosphorus: 3 mg/dL (ref 2.5–4.5)
Potassium: 3.8 mmol/L (ref 3.5–5.2)
Sodium: 133 mmol/L — ABNORMAL LOW (ref 134–144)

## 2015-11-30 LAB — PROTIME-INR
INR: 2 — ABNORMAL HIGH (ref 0.8–1.2)
Prothrombin Time: 20 s — ABNORMAL HIGH (ref 9.1–12.0)

## 2015-12-14 DIAGNOSIS — Z96641 Presence of right artificial hip joint: Secondary | ICD-10-CM | POA: Diagnosis not present

## 2015-12-14 DIAGNOSIS — M84351D Stress fracture, right femur, subsequent encounter for fracture with routine healing: Secondary | ICD-10-CM | POA: Diagnosis not present

## 2015-12-14 DIAGNOSIS — M898X5 Other specified disorders of bone, thigh: Secondary | ICD-10-CM | POA: Diagnosis not present

## 2015-12-14 DIAGNOSIS — M79604 Pain in right leg: Secondary | ICD-10-CM | POA: Diagnosis not present

## 2015-12-26 ENCOUNTER — Other Ambulatory Visit: Payer: Medicare Other

## 2015-12-26 DIAGNOSIS — Z7901 Long term (current) use of anticoagulants: Secondary | ICD-10-CM | POA: Diagnosis not present

## 2015-12-27 LAB — PROTIME-INR
INR: 2.4 — ABNORMAL HIGH (ref 0.8–1.2)
Prothrombin Time: 24.9 s — ABNORMAL HIGH (ref 9.1–12.0)

## 2016-01-02 DIAGNOSIS — S72009D Fracture of unspecified part of neck of unspecified femur, subsequent encounter for closed fracture with routine healing: Secondary | ICD-10-CM | POA: Diagnosis not present

## 2016-01-07 DIAGNOSIS — S72009A Fracture of unspecified part of neck of unspecified femur, initial encounter for closed fracture: Secondary | ICD-10-CM | POA: Insufficient documentation

## 2016-01-23 ENCOUNTER — Other Ambulatory Visit: Payer: Medicare Other

## 2016-01-23 DIAGNOSIS — Z7901 Long term (current) use of anticoagulants: Secondary | ICD-10-CM

## 2016-01-24 LAB — PROTIME-INR
INR: 3.2 — ABNORMAL HIGH (ref 0.8–1.2)
PROTHROMBIN TIME: 32.1 s — AB (ref 9.1–12.0)

## 2016-02-06 ENCOUNTER — Ambulatory Visit (INDEPENDENT_AMBULATORY_CARE_PROVIDER_SITE_OTHER): Payer: Medicare Other | Admitting: Family Medicine

## 2016-02-06 ENCOUNTER — Encounter: Payer: Self-pay | Admitting: Family Medicine

## 2016-02-06 VITALS — BP 120/80 | HR 78 | Ht 66.0 in | Wt 167.0 lb

## 2016-02-06 DIAGNOSIS — R079 Chest pain, unspecified: Secondary | ICD-10-CM

## 2016-02-06 DIAGNOSIS — I82409 Acute embolism and thrombosis of unspecified deep veins of unspecified lower extremity: Secondary | ICD-10-CM

## 2016-02-06 DIAGNOSIS — S29011A Strain of muscle and tendon of front wall of thorax, initial encounter: Secondary | ICD-10-CM

## 2016-02-06 DIAGNOSIS — M19012 Primary osteoarthritis, left shoulder: Secondary | ICD-10-CM | POA: Diagnosis not present

## 2016-02-06 NOTE — Progress Notes (Signed)
Name: Laurie Fuentes   MRN: [id number]    DOB: September 01, 1926   Date:02/06/2016       Progress Note  Subjective  Chief Complaint  Chief Complaint  Patient presents with  . Chest Pain    started last night over L) breast- pressure pain  . anticoagulation use    Chest Pain  This is a new problem. The current episode started yesterday. The onset quality is sudden. The problem has been waxing and waning. The pain is present in the lateral region. The pain is at a severity of 3/10 (none presently). The pain is mild. The quality of the pain is described as dull. The pain does not radiate. Pertinent negatives include no abdominal pain, back pain, claudication, cough, diaphoresis, dizziness, exertional chest pressure, fever, headaches, hemoptysis, irregular heartbeat, leg pain, lower extremity edema, malaise/fatigue, nausea, near-syncope, numbness, orthopnea, palpitations, PND, shortness of breath, sputum production, syncope, vomiting or weakness. The pain is aggravated by nothing. She has tried acetaminophen for the symptoms.  Pertinent negatives for past medical history include no aneurysm, no anxiety/panic attacks, no CAD, no MI and no PE.  Shoulder Injury  The incident occurred at home. The left shoulder is affected. The incident occurred 2 days ago. Injury mechanism: unloading dryer. The quality of the pain is described as aching. The pain does not radiate. The pain is at a severity of 3/10. The pain is moderate. Associated symptoms include chest pain. Pertinent negatives include no numbness or tingling. The symptoms are aggravated by movement. She has tried acetaminophen for the symptoms. The treatment provided no relief.    No problem-specific assessment & plan notes found for this encounter.   Past Medical History  Diagnosis Date  . Hypertension   . GERD (gastroesophageal reflux disease)   . Hypokalemia   . Stroke [facility])     Past Surgical History  Procedure Laterality Date  . Hip fracture  surgery Right   . Gallbladder surgery      Family History  Problem Relation Age of Onset  . Diabetes Sister     Social History   Social History  . Marital Status: Widowed    Spouse Name: N/A  . Number of Children: N/A  . Years of Education: N/A   Occupational History  . Not on file.   Social History Main Topics  . Smoking status: Never Smoker   . Smokeless tobacco: Not on file  . Alcohol Use: No  . Drug Use: No  . Sexual Activity: No   Other Topics Concern  . Not on file   Social History Narrative    No Known Allergies   Review of Systems  Constitutional: Negative for fever, chills, weight loss, malaise/fatigue and diaphoresis.  HENT: Negative for ear discharge, ear pain and sore throat.   Eyes: Negative for blurred vision.  Respiratory: Negative for cough, hemoptysis, sputum production, shortness of breath and wheezing.   Cardiovascular: Positive for chest pain. Negative for palpitations, orthopnea, claudication, leg swelling, syncope, PND and near-syncope.  Gastrointestinal: Negative for heartburn, nausea, vomiting, abdominal pain, diarrhea, constipation, blood in stool and melena.  Genitourinary: Negative for dysuria, urgency, frequency and hematuria.  Musculoskeletal: Negative for myalgias, back pain, joint pain and neck pain.  Skin: Negative for rash.  Neurological: Negative for dizziness, tingling, sensory change, focal weakness, weakness, numbness and headaches.  Endo/Heme/Allergies: Negative for environmental allergies and polydipsia. Does not bruise/bleed easily.  Psychiatric/Behavioral: Negative for depression and suicidal ideas. The patient is not nervous/anxious and does not  have insomnia.      Objective  Filed Vitals:   02/06/16 0931  BP: 120/80  Pulse: 78  Height: 5\' 6"  (1.676 m)  Weight: 167 lb (75.751 kg)    Physical Exam  Constitutional: She is well-developed, well-nourished, and in no distress. No distress.  HENT:  Head: Normocephalic  and atraumatic.  Right Ear: External ear normal.  Left Ear: External ear normal.  Nose: Nose normal.  Mouth/Throat: Oropharynx is clear and moist.  Eyes: Conjunctivae and EOM are normal. Pupils are equal, round, and reactive to light. Right eye exhibits no discharge. Left eye exhibits no discharge.  Neck: Normal range of motion. Neck supple. No JVD present. No thyromegaly present.  Cardiovascular: Normal rate, regular rhythm, normal heart sounds and intact distal pulses.  Exam reveals no gallop and no friction rub.   No murmur heard. Pulmonary/Chest: Effort normal and breath sounds normal.  Abdominal: Soft. Bowel sounds are normal. She exhibits no distension and no mass. There is no tenderness. There is no guarding.  Musculoskeletal: Normal range of motion. She exhibits tenderness. She exhibits no edema.  Left lateral intercostal space  Lymphadenopathy:    She has no cervical adenopathy.  Neurological: She is alert. She has normal reflexes.  Skin: Skin is warm and dry. She is not diaphoretic.  Psychiatric: Mood and affect normal.  Nursing note and vitals reviewed.     Assessment & Plan  Problem List Items Addressed This Visit      Cardiovascular and Mediastinum   Deep vein thrombosis of lower extremity ([place])   Relevant Orders   INR/PT    Other Visit Diagnoses    Chest pain, unspecified chest pain type    -  Primary    Relevant Orders    EKG 12-Lead (Completed)    Intercostal muscle strain, initial encounter        cont extra strength tylenol    Primary osteoarthritis of left shoulder        tylenol         Dr. Daveigh Batty [place]  02/06/2016

## 2016-02-07 ENCOUNTER — Other Ambulatory Visit: Payer: Self-pay

## 2016-02-07 DIAGNOSIS — Z7901 Long term (current) use of anticoagulants: Secondary | ICD-10-CM

## 2016-02-07 LAB — PROTIME-INR
INR: 4.3 — ABNORMAL HIGH (ref 0.8–1.2)
PROTHROMBIN TIME: 43.5 s — AB (ref 9.1–12.0)

## 2016-02-07 MED ORDER — WARFARIN SODIUM 1 MG PO TABS: 1.0000 mg | ORAL_TABLET | Freq: Every day | ORAL | Status: DC

## 2016-02-15 ENCOUNTER — Other Ambulatory Visit: Payer: Self-pay

## 2016-02-20 ENCOUNTER — Other Ambulatory Visit: Payer: Medicare Other

## 2016-02-20 DIAGNOSIS — Z7901 Long term (current) use of anticoagulants: Secondary | ICD-10-CM | POA: Diagnosis not present

## 2016-02-21 LAB — PROTIME-INR
INR: 2.3 — AB (ref 0.8–1.2)
PROTHROMBIN TIME: 23.1 s — AB (ref 9.1–12.0)

## 2016-03-19 ENCOUNTER — Other Ambulatory Visit: Payer: Medicare Other

## 2016-03-19 DIAGNOSIS — Z7901 Long term (current) use of anticoagulants: Secondary | ICD-10-CM | POA: Diagnosis not present

## 2016-03-20 LAB — PROTIME-INR
INR: 1.7 — AB (ref 0.8–1.2)
PROTHROMBIN TIME: 17.7 s — AB (ref 9.1–12.0)

## 2016-04-02 ENCOUNTER — Ambulatory Visit (INDEPENDENT_AMBULATORY_CARE_PROVIDER_SITE_OTHER): Payer: Medicare Other | Admitting: Family Medicine

## 2016-04-02 ENCOUNTER — Encounter: Payer: Self-pay | Admitting: Family Medicine

## 2016-04-02 VITALS — BP 120/70 | HR 64 | Ht 66.0 in | Wt 163.0 lb

## 2016-04-02 DIAGNOSIS — M47816 Spondylosis without myelopathy or radiculopathy, lumbar region: Secondary | ICD-10-CM

## 2016-04-02 DIAGNOSIS — Z7901 Long term (current) use of anticoagulants: Secondary | ICD-10-CM | POA: Insufficient documentation

## 2016-04-02 MED ORDER — TRAMADOL HCL 50 MG PO TABS: 50.0000 mg | ORAL_TABLET | Freq: Two times a day (BID) | ORAL | Status: DC

## 2016-04-02 NOTE — Progress Notes (Signed)
Name: Laurie Fuentes   MRN: [id number]    DOB: 10/29/1926   Date:04/02/2016       Progress Note  Subjective  Chief Complaint  Chief Complaint  Patient presents with  . Back Pain    hurting in lower back- not hurt it that she knows of, no problems urinating. Just "hurts me when I walk"  . chronic anticoagulation    needs INR/PT drawn    Back Pain This is a new problem. The current episode started in the past 7 days. The problem has been gradually worsening since onset. The pain is present in the lumbar spine. The quality of the pain is described as aching. The pain does not radiate. The pain is at a severity of 5/10. The pain is moderate. The symptoms are aggravated by bending and sitting. Pertinent negatives include no abdominal pain, chest pain, dysuria, fever, headaches, tingling or weight loss. Treatments tried: tylenol. The treatment provided mild relief.    No problem-specific assessment & plan notes found for this encounter.   Past Medical History  Diagnosis Date  . Hypertension   . GERD (gastroesophageal reflux disease)   . Hypokalemia   . Stroke [facility])     Past Surgical History  Procedure Laterality Date  . Hip fracture surgery Right   . Gallbladder surgery      Family History  Problem Relation Age of Onset  . Diabetes Sister     Social History   Social History  . Marital Status: Widowed    Spouse Name: N/A  . Number of Children: N/A  . Years of Education: N/A   Occupational History  . Not on file.   Social History Main Topics  . Smoking status: Never Smoker   . Smokeless tobacco: Not on file  . Alcohol Use: No  . Drug Use: No  . Sexual Activity: No   Other Topics Concern  . Not on file   Social History Narrative    No Known Allergies   Review of Systems  Constitutional: Negative for fever, chills, weight loss and malaise/fatigue.  HENT: Negative for ear discharge, ear pain and sore throat.   Eyes: Negative for blurred vision.  Respiratory:  Negative for cough, sputum production, shortness of breath and wheezing.   Cardiovascular: Negative for chest pain, palpitations and leg swelling.  Gastrointestinal: Negative for heartburn, nausea, abdominal pain, diarrhea, constipation, blood in stool and melena.  Genitourinary: Negative for dysuria, urgency, frequency and hematuria.  Musculoskeletal: Positive for back pain. Negative for myalgias, joint pain and neck pain.  Skin: Negative for rash.  Neurological: Negative for dizziness, tingling, sensory change, focal weakness and headaches.  Endo/Heme/Allergies: Negative for environmental allergies and polydipsia. Does not bruise/bleed easily.  Psychiatric/Behavioral: Negative for depression and suicidal ideas. The patient is not nervous/anxious and does not have insomnia.      Objective  Filed Vitals:   04/02/16 1034  BP: 120/70  Pulse: 64  Height: 5\' 6"  (1.676 m)  Weight: 163 lb (73.936 kg)    Physical Exam  Constitutional: She is well-developed, well-nourished, and in no distress. No distress.  HENT:  Head: Normocephalic and atraumatic.  Right Ear: External ear normal.  Left Ear: External ear normal.  Nose: Nose normal.  Mouth/Throat: Oropharynx is clear and moist.  Eyes: Conjunctivae and EOM are normal. Pupils are equal, round, and reactive to light. Right eye exhibits no discharge. Left eye exhibits no discharge.  Neck: Normal range of motion. Neck supple. No JVD present. No thyromegaly  present.  Cardiovascular: Normal rate, regular rhythm, normal heart sounds and intact distal pulses.  Exam reveals no gallop and no friction rub.   No murmur heard. Pulmonary/Chest: Effort normal and breath sounds normal.  Abdominal: Soft. Bowel sounds are normal. She exhibits no mass. There is no tenderness. There is no guarding.  Musculoskeletal: Normal range of motion. She exhibits no edema.       Lumbar back: She exhibits spasm. She exhibits no tenderness, no bony tenderness and no  deformity.  Lymphadenopathy:    She has no cervical adenopathy.  Neurological: She is alert. She has normal sensation, normal strength and normal reflexes. She has a normal Straight Leg Raise Test.  Skin: Skin is warm and dry. She is not diaphoretic.  Psychiatric: Mood and affect normal.  Nursing note and vitals reviewed.     Assessment & Plan  Problem List Items Addressed This Visit      Other   Anticoagulant long-term use - Primary   Relevant Orders   INR/PT    Other Visit Diagnoses    Spondylosis of lumbar region without myelopathy or radiculopathy        Relevant Medications    traMADol (ULTRAM) 50 MG tablet         Dr. [facility]  04/02/2016

## 2016-04-03 LAB — PROTIME-INR
INR: 1.7 — ABNORMAL HIGH (ref 0.8–1.2)
PROTHROMBIN TIME: 17.1 s — AB (ref 9.1–12.0)

## 2016-04-23 ENCOUNTER — Other Ambulatory Visit: Payer: Medicare Other

## 2016-04-23 DIAGNOSIS — Z7901 Long term (current) use of anticoagulants: Secondary | ICD-10-CM | POA: Diagnosis not present

## 2016-04-24 LAB — PROTIME-INR
INR: 1.7 — AB (ref 0.8–1.2)
Prothrombin Time: 17.9 s — ABNORMAL HIGH (ref 9.1–12.0)

## 2016-05-08 ENCOUNTER — Other Ambulatory Visit: Payer: Medicare Other

## 2016-05-08 DIAGNOSIS — Z7901 Long term (current) use of anticoagulants: Secondary | ICD-10-CM | POA: Diagnosis not present

## 2016-05-09 LAB — PROTIME-INR
INR: 1.9 — AB (ref 0.8–1.2)
Prothrombin Time: 19.4 s — ABNORMAL HIGH (ref 9.1–12.0)

## 2016-05-21 ENCOUNTER — Other Ambulatory Visit: Payer: Medicare Other

## 2016-05-21 DIAGNOSIS — Z7901 Long term (current) use of anticoagulants: Secondary | ICD-10-CM

## 2016-05-22 LAB — PROTIME-INR
INR: 2 — ABNORMAL HIGH (ref 0.8–1.2)
Prothrombin Time: 20.9 s — ABNORMAL HIGH (ref 9.1–12.0)

## 2016-05-28 ENCOUNTER — Ambulatory Visit (INDEPENDENT_AMBULATORY_CARE_PROVIDER_SITE_OTHER): Payer: Medicare Other | Admitting: Family Medicine

## 2016-05-28 ENCOUNTER — Encounter: Payer: Self-pay | Admitting: Family Medicine

## 2016-05-28 VITALS — BP 118/70 | HR 78 | Ht 66.0 in | Wt 164.0 lb

## 2016-05-28 DIAGNOSIS — Z7901 Long term (current) use of anticoagulants: Secondary | ICD-10-CM | POA: Diagnosis not present

## 2016-05-28 DIAGNOSIS — I482 Chronic atrial fibrillation, unspecified: Secondary | ICD-10-CM | POA: Insufficient documentation

## 2016-05-28 DIAGNOSIS — H6122 Impacted cerumen, left ear: Secondary | ICD-10-CM

## 2016-05-28 DIAGNOSIS — K219 Gastro-esophageal reflux disease without esophagitis: Secondary | ICD-10-CM | POA: Diagnosis not present

## 2016-05-28 DIAGNOSIS — I10 Essential (primary) hypertension: Secondary | ICD-10-CM

## 2016-05-28 DIAGNOSIS — E876 Hypokalemia: Secondary | ICD-10-CM | POA: Diagnosis not present

## 2016-05-28 MED ORDER — HYDROCHLOROTHIAZIDE 25 MG PO TABS: 25.0000 mg | ORAL_TABLET | Freq: Every day | ORAL | 5 refills | Status: DC

## 2016-05-28 MED ORDER — CARBAMIDE PEROXIDE 6.5 % OT SOLN: 5.0000 [drp] | Freq: Once | OTIC | Status: DC

## 2016-05-28 MED ORDER — POTASSIUM CHLORIDE CRYS ER 20 MEQ PO TBCR: 20.0000 meq | EXTENDED_RELEASE_TABLET | Freq: Two times a day (BID) | ORAL | 5 refills | Status: DC

## 2016-05-28 MED ORDER — RANITIDINE HCL 300 MG PO TABS: 300.0000 mg | ORAL_TABLET | Freq: Every day | ORAL | 5 refills | Status: DC

## 2016-05-28 MED ORDER — AMLODIPINE BESYLATE 5 MG PO TABS: 5.0000 mg | ORAL_TABLET | Freq: Every day | ORAL | 5 refills | Status: DC

## 2016-05-28 NOTE — Progress Notes (Signed)
Name: Laurie Fuentes   MRN: [id number]    DOB: [birth year]   Date:05/28/2016       Progress Note  Subjective  Chief Complaint  Chief Complaint  Patient presents with  . Hypertension  . Atrial Fibrillation  . Gastroesophageal Reflux  . hypokalemia    Hypertension  This is a chronic problem. The current episode started more than 1 year ago. The problem is unchanged. The problem is controlled. Pertinent negatives include no anxiety, blurred vision, chest pain, headaches, malaise/fatigue, neck pain, orthopnea, palpitations, peripheral edema, PND, shortness of breath or sweats. There are no associated agents to hypertension. Risk factors for coronary artery disease include dyslipidemia. There are no compliance problems.  There is no history of angina, kidney disease, CAD/MI, CVA, heart failure, left ventricular hypertrophy, PVD, renovascular disease or retinopathy. There is no history of chronic renal disease or a hypertension causing med.  Atrial Fibrillation  Presents for follow-up visit. Symptoms include hypertension. Symptoms are negative for an AICD problem, bradycardia, chest pain, dizziness, hemodynamic instability, hypotension, pacemaker problem, palpitations, shortness of breath, syncope, tachycardia and weakness. The symptoms have been stable. Past medical history includes atrial fibrillation. There are no medication compliance problems.  Gastroesophageal Reflux  She reports no abdominal pain, no belching, no chest pain, no choking, no coughing, no dysphagia, no early satiety, no globus sensation, no heartburn, no hoarse voice, no nausea, no sore throat, no stridor, no tooth decay, no water brash or no wheezing. This is a chronic problem. The current episode started more than 1 year ago. The problem has been unchanged. Pertinent negatives include no melena or weight loss. She has tried a histamine-2 antagonist for the symptoms.    No problem-specific Assessment & Plan notes found for this  encounter.   Past Medical History:  Diagnosis Date  . GERD (gastroesophageal reflux disease)   . Hypertension   . Hypokalemia   . Stroke [facility])     Past Surgical History:  Procedure Laterality Date  . GALLBLADDER SURGERY    . HIP FRACTURE SURGERY Right     Family History  Problem Relation Age of Onset  . Diabetes Sister     Social History   Social History  . Marital status: Widowed    Spouse name: N/A  . Number of children: N/A  . Years of education: N/A   Occupational History  . Not on file.   Social History Main Topics  . Smoking status: Never Smoker  . Smokeless tobacco: Not on file  . Alcohol use No  . Drug use: No  . Sexual activity: No   Other Topics Concern  . Not on file   Social History Narrative  . No narrative on file    No Known Allergies   Review of Systems  Constitutional: Negative for chills, fever, malaise/fatigue and weight loss.  HENT: Positive for hearing loss. Negative for ear discharge, ear pain, hoarse voice and sore throat.   Eyes: Negative for blurred vision.  Respiratory: Negative for cough, sputum production, choking, shortness of breath and wheezing.   Cardiovascular: Negative for chest pain, palpitations, orthopnea, leg swelling, syncope and PND.  Gastrointestinal: Negative for abdominal pain, blood in stool, constipation, diarrhea, dysphagia, heartburn, melena and nausea.  Genitourinary: Negative for dysuria, frequency, hematuria and urgency.  Musculoskeletal: Negative for back pain, joint pain, myalgias and neck pain.  Skin: Negative for rash.  Neurological: Negative for dizziness, tingling, sensory change, focal weakness, weakness and headaches.  Endo/Heme/Allergies: Negative for environmental allergies and  polydipsia. Does not bruise/bleed easily.  Psychiatric/Behavioral: Negative for depression and suicidal ideas. The patient is not nervous/anxious and does not have insomnia.      Objective  Vitals:   05/28/16 0910   BP: 118/70  Pulse: 78  Weight: 164 lb (74.4 kg)  Height: 5\' 6"  (1.676 m)    Physical Exam  Constitutional: She is well-developed, well-nourished, and in no distress. No distress.  HENT:  Head: Normocephalic and atraumatic.  Right Ear: Tympanic membrane and external ear normal.  Left Ear: External ear normal. A foreign body is present.  Nose: Nose normal.  Mouth/Throat: Oropharynx is clear and moist.  Eyes: Conjunctivae and EOM are normal. Pupils are equal, round, and reactive to light. Right eye exhibits no discharge. Left eye exhibits no discharge.  Neck: Normal range of motion. Neck supple. No JVD present. No thyromegaly present.  Cardiovascular: Normal rate, regular rhythm, normal heart sounds and intact distal pulses.  Exam reveals no gallop and no friction rub.   No murmur heard. Pulmonary/Chest: Effort normal and breath sounds normal.  Abdominal: Soft. Bowel sounds are normal. She exhibits no mass. There is no tenderness. There is no guarding.  Musculoskeletal: Normal range of motion. She exhibits no edema.  Lymphadenopathy:    She has no cervical adenopathy.  Neurological: She is alert. She has normal reflexes.  Skin: Skin is warm and dry. She is not diaphoretic.  Psychiatric: Mood and affect normal.      Assessment & Plan  Problem List Items Addressed This Visit      Cardiovascular and Mediastinum   Essential hypertension - Primary   Relevant Medications   amLODipine (NORVASC) 5 MG tablet   hydrochlorothiazide (HYDRODIURIL) 25 MG tablet   Other Relevant Orders   Renal Function Panel   Chronic atrial fibrillation (HCC)   Relevant Medications   amLODipine (NORVASC) 5 MG tablet   hydrochlorothiazide (HYDRODIURIL) 25 MG tablet     Digestive   GERD (gastroesophageal reflux disease)   Relevant Medications   ranitidine (ZANTAC) 300 MG tablet     Other   Anticoagulant long-term use   Relevant Orders   INR/PT   Hypokalemia   Relevant Orders   Renal Function  Panel    Other Visit Diagnoses    Cerumen impaction, left       Relevant Medications   carbamide peroxide (DEBROX) 6.5 % otic solution 5 drop   Hypokalemia due to loss of potassium       Relevant Medications   potassium chloride SA (K-DUR,KLOR-CON) 20 MEQ tablet        Dr. Azeez Dunker [facility]  05/28/16

## 2016-05-29 LAB — RENAL FUNCTION PANEL
Albumin: 4.4 g/dL (ref 3.5–4.7)
BUN/Creatinine Ratio: 16 (ref 12–28)
BUN: 12 mg/dL (ref 8–27)
CO2: 28 mmol/L (ref 18–29)
Calcium: 9.7 mg/dL (ref 8.7–10.3)
Chloride: 85 mmol/L — ABNORMAL LOW (ref 96–106)
Creatinine, Ser: 0.73 mg/dL (ref 0.57–1.00)
GFR, EST AFRICAN AMERICAN: 86 mL/min/{1.73_m2} (ref >59)
GFR, EST NON AFRICAN AMERICAN: 74 mL/min/{1.73_m2} (ref >59)
GLUCOSE: 86 mg/dL (ref 65–99)
PHOSPHORUS: 3.5 mg/dL (ref 2.5–4.5)
POTASSIUM: 3.8 mmol/L (ref 3.5–5.2)
SODIUM: 131 mmol/L — AB (ref 134–144)

## 2016-05-29 LAB — PROTIME-INR
INR: 2.4 — ABNORMAL HIGH (ref 0.8–1.2)
PROTHROMBIN TIME: 24.1 s — AB (ref 9.1–12.0)

## 2016-06-25 ENCOUNTER — Other Ambulatory Visit (INDEPENDENT_AMBULATORY_CARE_PROVIDER_SITE_OTHER): Payer: Medicare Other

## 2016-06-25 DIAGNOSIS — Z7901 Long term (current) use of anticoagulants: Secondary | ICD-10-CM

## 2016-06-25 DIAGNOSIS — H6123 Impacted cerumen, bilateral: Secondary | ICD-10-CM | POA: Diagnosis not present

## 2016-06-26 LAB — PROTIME-INR
INR: 1.7 — ABNORMAL HIGH (ref 0.8–1.2)
Prothrombin Time: 17.6 s — ABNORMAL HIGH (ref 9.1–12.0)

## 2016-07-09 ENCOUNTER — Other Ambulatory Visit: Payer: Medicare Other

## 2016-07-09 DIAGNOSIS — Z7901 Long term (current) use of anticoagulants: Secondary | ICD-10-CM

## 2016-07-10 LAB — PROTIME-INR
INR: 3.7 — AB (ref 0.8–1.2)
PROTHROMBIN TIME: 36.1 s — AB (ref 9.1–12.0)

## 2016-07-23 ENCOUNTER — Other Ambulatory Visit: Payer: Medicare Other

## 2016-07-23 DIAGNOSIS — Z7901 Long term (current) use of anticoagulants: Secondary | ICD-10-CM | POA: Diagnosis not present

## 2016-07-24 LAB — PROTIME-INR
INR: 2 — ABNORMAL HIGH (ref 0.8–1.2)
Prothrombin Time: 20.4 s — ABNORMAL HIGH (ref 9.1–12.0)

## 2016-08-13 ENCOUNTER — Emergency Department: Payer: Medicare Other

## 2016-08-13 ENCOUNTER — Emergency Department
Admission: EM | Admit: 2016-08-13 | Discharge: 2016-08-13 | Disposition: A | Discharge status: Home / Self Care | Payer: Medicare Other | Attending: Emergency Medicine | Admitting: Emergency Medicine

## 2016-08-13 ENCOUNTER — Encounter: Payer: Self-pay | Admitting: Emergency Medicine

## 2016-08-13 DIAGNOSIS — S99921A Unspecified injury of right foot, initial encounter: Secondary | ICD-10-CM | POA: Diagnosis not present

## 2016-08-13 DIAGNOSIS — Y9389 Activity, other specified: Secondary | ICD-10-CM | POA: Insufficient documentation

## 2016-08-13 DIAGNOSIS — Z79899 Other long term (current) drug therapy: Secondary | ICD-10-CM | POA: Insufficient documentation

## 2016-08-13 DIAGNOSIS — S90121A Contusion of right lesser toe(s) without damage to nail, initial encounter: Secondary | ICD-10-CM | POA: Insufficient documentation

## 2016-08-13 DIAGNOSIS — Y999 Unspecified external cause status: Secondary | ICD-10-CM | POA: Diagnosis not present

## 2016-08-13 DIAGNOSIS — M79671 Pain in right foot: Secondary | ICD-10-CM | POA: Diagnosis not present

## 2016-08-13 DIAGNOSIS — Z7901 Long term (current) use of anticoagulants: Secondary | ICD-10-CM | POA: Insufficient documentation

## 2016-08-13 DIAGNOSIS — S2242XA Multiple fractures of ribs, left side, initial encounter for closed fracture: Secondary | ICD-10-CM | POA: Insufficient documentation

## 2016-08-13 DIAGNOSIS — S299XXA Unspecified injury of thorax, initial encounter: Secondary | ICD-10-CM | POA: Diagnosis present

## 2016-08-13 DIAGNOSIS — Y92002 Bathroom of unspecified non-institutional (private) residence single-family (private) house as the place of occurrence of the external cause: Secondary | ICD-10-CM | POA: Diagnosis not present

## 2016-08-13 DIAGNOSIS — I10 Essential (primary) hypertension: Secondary | ICD-10-CM | POA: Insufficient documentation

## 2016-08-13 DIAGNOSIS — W010XXA Fall on same level from slipping, tripping and stumbling without subsequent striking against object, initial encounter: Secondary | ICD-10-CM | POA: Insufficient documentation

## 2016-08-13 MED ORDER — HYDROCODONE-ACETAMINOPHEN 5-325 MG PO TABS: 1.0000 | ORAL_TABLET | Freq: Four times a day (QID) | ORAL | 0 refills | Status: DC | PRN

## 2016-08-13 NOTE — ED Triage Notes (Signed)
Reports slipping off toilet this am.  Denies LOC.  Pain to left foot.

## 2016-08-13 NOTE — ED Provider Notes (Signed)
[facility] Emergency Department Provider Note  ____________________________________________  Time seen: Approximately 9:24 AM  I have reviewed the triage vital signs and the nursing notes.   HISTORY  Chief Complaint Foot Pain    HPI Laurie Fuentes is a 82 y.o. female , NAD, presents to the emergency department accompanied by her daughter who assists with history. Patient states after she used the restroom this morning, she stood from the toilet and began to ambulate when her foot began to slip forward. She was wearing socks only. States she fell hurting her right little toe and landing on her left back.Patient states she did not hit her head nor has any neck or lower back pain. Has not had any saddle paresthesias nor loss of bowel or bladder control. Denies any numbness, weakness, tingling. Has not had LOC, dizziness or visual changes. The family at the bedside states there is been no changes in the patient's demeanor nor any slurred speech. Patient denies chest pain, shortness of breath, cough, wheezing, abdominal pain, nausea or vomiting. No open wounds or lacerations. No redness, swelling, abnormal warmth or bruising.   Past Medical History:  Diagnosis Date  . GERD (gastroesophageal reflux disease)   . Hypertension   . Hypokalemia   . Stroke [facility])     Patient Active Problem List   Diagnosis Date Noted  . Chronic atrial fibrillation ([place]) 05/28/2016  . Hypokalemia 05/28/2016  . Anticoagulant long-term use 04/02/2016  . Essential hypertension 11/29/2015  . Cerebral vascular disease 11/29/2015  . GERD (gastroesophageal reflux disease) 06/14/2015  . Deep vein thrombosis of lower extremity ([place]) 06/24/2014    Past Surgical History:  Procedure Laterality Date  . GALLBLADDER SURGERY    . HIP FRACTURE SURGERY Right     Prior to Admission medications   Medication Sig Start Date End Date Taking? Authorizing Provider  amLODipine (NORVASC) 5 MG tablet  Take 1 tablet (5 mg total) by mouth daily. 05/28/16   Juline Patch, MD  hydrochlorothiazide (HYDRODIURIL) 25 MG tablet Take 1 tablet (25 mg total) by mouth daily. 05/28/16   Juline Patch, MD  HYDROcodone-acetaminophen (NORCO) 5-325 MG tablet Take 1 tablet by mouth every 6 (six) hours as needed for severe pain. 08/13/16   Jami L Hagler, PA-C  potassium chloride SA (K-DUR,KLOR-CON) 20 MEQ tablet Take 1 tablet (20 mEq total) by mouth 2 (two) times daily. 05/28/16   Juline Patch, MD  ranitidine (ZANTAC) 300 MG tablet Take 1 tablet (300 mg total) by mouth at bedtime. 05/28/16   Juline Patch, MD  warfarin (COUMADIN) 1 MG tablet Take 1 tablet (1 mg total) by mouth daily. Use as directed 02/07/16   Juline Patch, MD  warfarin (COUMADIN) 2 MG tablet Take 1 tablet (2 mg total) by mouth daily. 11/29/15   Juline Patch, MD  warfarin (COUMADIN) 3 MG tablet Take 1 tablet (3 mg total) by mouth daily. 11/29/15   Juline Patch, MD    Allergies Review of patient's allergies indicates no known allergies.  Family History  Problem Relation Age of Onset  . Diabetes Sister     Social History Social History  Substance Use Topics  . Smoking status: Never Smoker  . Smokeless tobacco: Never Used  . Alcohol use No     Review of Systems  Constitutional: No fever/chills Eyes: No visual changes.  Cardiovascular: No chest pain. Respiratory: No cough. No shortness of breath. No wheezing.  Gastrointestinal: No abdominal pain.  No  nausea, vomiting.   Musculoskeletal: Positive right foot and left posterior rib pain. No lower back pain. Skin: Negative for rash, redness, swelling, abnormal warmth, bruising, open wounds or lacerations. Neurological: Negative for headaches, focal weakness or numbness. No tingling. No loss of bowel or bladder control or saddle paresthesias. 10-point ROS otherwise negative.  ____________________________________________   PHYSICAL EXAM:  VITAL SIGNS: ED Triage Vitals  Enc  Vitals Group     BP 08/13/16 0918 (!) 132/55     Pulse Rate 08/13/16 0918 81     Resp 08/13/16 0918 18     Temp 08/13/16 0918 97.8 F (36.6 C)     Temp Source 08/13/16 0918 Oral     SpO2 08/13/16 0918 100 %     Weight 08/13/16 0907 150 lb (68 kg)     Height 08/13/16 0907 5\' 4"  (1.626 m)     Head Circumference --      Peak Flow --      Pain Score 08/13/16 0907 6     Pain Loc --      Pain Edu? --      Excl. in [place]? --      Constitutional: Alert and oriented. Well appearing and in no acute distress. Eyes: Conjunctivae are normal. Head: Atraumatic.  Neck: Supple with full range of motion. Hematological/Lymphatic/Immunilogical: No cervical lymphadenopathy. Cardiovascular: Normal rate, regular rhythm. Normal S1 and S2.  Good peripheral circulation with 1+ palpable pulses in the right lower extremity. Capillary refill is brisk in the digits of the right foot. Respiratory: Normal respiratory effort without tachypnea or retractions. Lungs CTAB with breath sounds noted in all lung fields. Musculoskeletal: Tenderness to palpation about the left, posterior rib cage without crepitus, bony abnormalities or step-offs. Tenderness to palpation about the right lateral fifth metatarsal and fifth phalanx. No bony abnormalities or crepitus noted. Full range of motion of the left ankle and toes without pain or difficulty. No lower extremity tenderness nor edema.  No joint effusions. Neurologic:  Normal speech and language. No gross focal neurologic deficits are appreciated.  Skin:  Skin is warm, dry and intact. No rash, redness, swelling, and wounds noted. Psychiatric: Mood and affect are normal. Speech and behavior are normal. Patient exhibits appropriate insight and judgement.   ____________________________________________   LABS  None ____________________________________________  EKG  None ____________________________________________  RADIOLOGY I, Braxton Feathers, personally viewed and  evaluated these images (plain radiographs) as part of my medical decision making, as well as reviewing the written report by the radiologist.  Dg Ribs Unilateral W/chest Left  Result Date: 08/13/2016 CLINICAL DATA:  Slip and fall today. Left rib pain. Initial encounter. EXAM: LEFT RIBS AND CHEST - 3+ VIEW COMPARISON:  Chest x-ray on 12/04/2011 FINDINGS: The heart size and mediastinal contours are within normal limits. Mild bibasilar atelectasis present. There is no evidence of pulmonary edema, consolidation, pneumothorax, nodule or pleural fluid. Left-sided rib films show nondisplaced fractures involving the left eighth and ninth ribs. No bony lesions identified. IMPRESSION: Acute appearing nondisplaced fractures of the left eighth and ninth ribs. No evidence of pneumothorax or hemothorax by chest x-ray. Electronically Signed   By: Aletta Edouard M.D.   On: 08/13/2016 11:20   Dg Foot Complete Right  Result Date: 08/13/2016 CLINICAL DATA:  Fall in bathroom with lateral right foot pain. EXAM: RIGHT FOOT COMPLETE - 3+ VIEW COMPARISON:  None. FINDINGS: Diffuse osteopenia. No fracture or dislocation. No suspicious focal osseous lesion. Mild degenerative changes in the dorsal tarsal joints. Small  Achilles and plantar right calcaneal spurs. Vascular calcifications throughout the soft tissues. IMPRESSION: No fracture or malalignment. Electronically Signed   By: Ilona Sorrel M.D.   On: 08/13/2016 10:10    ____________________________________________    PROCEDURES  Procedure(s) performed: None   Procedures   Medications - No data to display   ____________________________________________   INITIAL IMPRESSION / ASSESSMENT AND PLAN / ED COURSE  Pertinent labs & imaging results that were available during my care of the patient were reviewed by me and considered in my medical decision making (see chart for details).  Clinical Course  Comment By Time  I have called radiology to inquire about  the delay on the read of this [place]'s rib x-ray. X-ray of the foot was read 1 hour ago and was negative. The radiologist will be contacted. [place], PA-C 10/10 1113    Patient's diagnosis is consistent with multiple fractures of left ribs, contusion of right 5th toe. Patient will be discharged home with prescriptions for Norco to take as directed. Patient is to follow up with her PCP or Dr. Sabra Heck in Orthopedics in 2-3 days if symptoms persist past this treatment course. Patient is given ED precautions to return to the ED for any worsening or new symptoms.    ____________________________________________  FINAL CLINICAL IMPRESSION(S) / ED DIAGNOSES  Final diagnoses:  Multiple fractures of ribs, left side, initial encounter for closed fracture  Contusion of fifth toe of right foot, initial encounter      NEW MEDICATIONS STARTED DURING THIS VISIT:  Discharge Medication List as of 08/13/2016 11:26 AM    START taking these medications   Details  HYDROcodone-acetaminophen (NORCO) 5-325 MG tablet Take 1 tablet by mouth every 6 (six) hours as needed for severe pain., Starting Tue 08/13/2016, Print             Braxton Feathers, PA-C 08/13/16 1331    Schuyler Amor, MD 08/13/16 1447

## 2016-08-16 ENCOUNTER — Encounter: Payer: Self-pay | Admitting: Family Medicine

## 2016-08-16 ENCOUNTER — Ambulatory Visit (INDEPENDENT_AMBULATORY_CARE_PROVIDER_SITE_OTHER): Payer: Medicare Other | Admitting: Family Medicine

## 2016-08-16 VITALS — BP 120/80 | HR 64 | Ht 64.0 in | Wt 163.0 lb

## 2016-08-16 DIAGNOSIS — S2242XD Multiple fractures of ribs, left side, subsequent encounter for fracture with routine healing: Secondary | ICD-10-CM

## 2016-08-16 MED ORDER — HYDROCODONE-ACETAMINOPHEN 5-325 MG PO TABS
1.0000 | ORAL_TABLET | Freq: Four times a day (QID) | ORAL | 0 refills | Status: DC | PRN
Start: 2016-08-16 — End: 2016-10-30

## 2016-08-16 NOTE — Progress Notes (Signed)
Name: Laurie Fuentes   MRN: [id number]    DOB: [birth year]   Date:08/16/2016       Progress Note  Subjective  Chief Complaint  Chief Complaint  Patient presents with  . Follow-up    ER visit on 10/10 17- Dx 2 rib fx- needs referral to Dr Earnestine Leys  . Rib Fracture    wants refill on pain med- is only taking it BID    Patient presents for followup for rib fracture.    No problem-specific Assessment & Plan notes found for this encounter.   Past Medical History:  Diagnosis Date  . GERD (gastroesophageal reflux disease)   . Hypertension   . Hypokalemia   . Stroke [facility])     Past Surgical History:  Procedure Laterality Date  . GALLBLADDER SURGERY    . HIP FRACTURE SURGERY Right     Family History  Problem Relation Age of Onset  . Diabetes Sister     Social History   Social History  . Marital status: Widowed    Spouse name: N/A  . Number of children: N/A  . Years of education: N/A   Occupational History  . Not on file.   Social History Main Topics  . Smoking status: Never Smoker  . Smokeless tobacco: Never Used  . Alcohol use No  . Drug use: No  . Sexual activity: No   Other Topics Concern  . Not on file   Social History Narrative  . No narrative on file    No Known Allergies   Review of Systems  Constitutional: Negative for chills, fever, malaise/fatigue and weight loss.  HENT: Negative for ear discharge, ear pain and sore throat.   Eyes: Negative for blurred vision.  Respiratory: Negative for cough, sputum production, shortness of breath and wheezing.   Cardiovascular: Negative for chest pain, palpitations and leg swelling.  Gastrointestinal: Negative for abdominal pain, blood in stool, constipation, diarrhea, heartburn, melena and nausea.  Genitourinary: Negative for dysuria, frequency, hematuria and urgency.  Musculoskeletal: Negative for back pain, joint pain, myalgias and neck pain.       Chest wall pain  Skin: Negative for rash.   Neurological: Negative for dizziness, tingling, sensory change, focal weakness and headaches.  Endo/Heme/Allergies: Negative for environmental allergies and polydipsia. Does not bruise/bleed easily.  Psychiatric/Behavioral: Negative for depression and suicidal ideas. The patient is not nervous/anxious and does not have insomnia.      Objective  Vitals:   08/16/16 1031  BP: 120/80  Pulse: 64  Weight: 163 lb (73.9 kg)  Height: 5\' 4"  (1.626 m)    Physical Exam  Constitutional: She is well-developed, well-nourished, and in no distress. No distress.  HENT:  Head: Normocephalic and atraumatic.  Right Ear: External ear normal.  Left Ear: External ear normal.  Nose: Nose normal.  Mouth/Throat: Oropharynx is clear and moist.  Eyes: Conjunctivae and EOM are normal. Pupils are equal, round, and reactive to light. Right eye exhibits no discharge. Left eye exhibits no discharge.  Neck: Normal range of motion. Neck supple. No JVD present. No thyromegaly present.  Cardiovascular: Normal rate, regular rhythm, normal heart sounds and intact distal pulses.  Exam reveals no gallop and no friction rub.   No murmur heard. Pulmonary/Chest: Effort normal and breath sounds normal. No respiratory distress. She has no wheezes. She has no rales. She exhibits tenderness and bony tenderness.  Lateral 8-9 left  Abdominal: Soft. Bowel sounds are normal. She exhibits no mass. There is no tenderness.  There is no guarding.  Musculoskeletal: Normal range of motion. She exhibits no edema.  Lymphadenopathy:    She has no cervical adenopathy.  Neurological: She is alert. She has normal reflexes.  Skin: Skin is warm and dry. She is not diaphoretic.  Psychiatric: Mood and affect normal.      Assessment & Plan  Problem List Items Addressed This Visit    None    Visit Diagnoses    Closed fracture of multiple ribs of left side with routine healing, subsequent encounter    -  Primary   Relevant Medications    HYDROcodone-acetaminophen (NORCO) 5-325 MG tablet        Dr. Charlena Haub [facility]  08/16/16

## 2016-08-20 ENCOUNTER — Other Ambulatory Visit: Payer: Medicare Other

## 2016-08-20 DIAGNOSIS — Z7901 Long term (current) use of anticoagulants: Secondary | ICD-10-CM | POA: Diagnosis not present

## 2016-08-21 LAB — PROTIME-INR
INR: 2.1 — ABNORMAL HIGH (ref 0.8–1.2)
Prothrombin Time: 21.4 s — ABNORMAL HIGH (ref 9.1–12.0)

## 2016-08-26 ENCOUNTER — Other Ambulatory Visit: Payer: Self-pay | Admitting: Family Medicine

## 2016-08-26 DIAGNOSIS — I82409 Acute embolism and thrombosis of unspecified deep veins of unspecified lower extremity: Secondary | ICD-10-CM

## 2016-08-27 ENCOUNTER — Other Ambulatory Visit: Payer: Self-pay

## 2016-08-30 ENCOUNTER — Ambulatory Visit (INDEPENDENT_AMBULATORY_CARE_PROVIDER_SITE_OTHER): Payer: Medicare Other | Admitting: Family Medicine

## 2016-08-30 VITALS — BP 110/62 | HR 56 | Ht 64.0 in | Wt 163.0 lb

## 2016-08-30 DIAGNOSIS — S2241XD Multiple fractures of ribs, right side, subsequent encounter for fracture with routine healing: Secondary | ICD-10-CM

## 2016-08-30 NOTE — Progress Notes (Signed)
Name: Laurie Fuentes   MRN: [id number]    DOB: [birth year]   Date:08/30/2016       Progress Note  Subjective  Chief Complaint  Chief Complaint  Patient presents with  . Follow-up    fall- rib fx- "feeling better"    Chest Pain   This is a new problem. The current episode started 1 to 4 weeks ago. The problem has been gradually improving. The pain is present in the lateral region. The pain is at a severity of 3/10. The pain is mild. Pertinent negatives include no abdominal pain, back pain, cough, dizziness, exertional chest pressure, fever, headaches, hemoptysis, malaise/fatigue, nausea, orthopnea, palpitations, PND, shortness of breath or sputum production. The pain is aggravated by deep breathing and coughing. She has tried analgesics for the symptoms.    No problem-specific Assessment & Plan notes found for this encounter.   Past Medical History:  Diagnosis Date  . GERD (gastroesophageal reflux disease)   . Hypertension   . Hypokalemia   . Stroke [facility])     Past Surgical History:  Procedure Laterality Date  . GALLBLADDER SURGERY    . HIP FRACTURE SURGERY Right     Family History  Problem Relation Age of Onset  . Diabetes Sister     Social History   Social History  . Marital status: Widowed    Spouse name: N/A  . Number of children: N/A  . Years of education: N/A   Occupational History  . Not on file.   Social History Main Topics  . Smoking status: Never Smoker  . Smokeless tobacco: Never Used  . Alcohol use No  . Drug use: No  . Sexual activity: No   Other Topics Concern  . Not on file   Social History Narrative  . No narrative on file    No Known Allergies   Review of Systems  Constitutional: Negative for chills, fever, malaise/fatigue and weight loss.  HENT: Negative for ear discharge, ear pain and sore throat.   Eyes: Negative for blurred vision.  Respiratory: Negative for cough, hemoptysis, sputum production, shortness of breath and wheezing.    Cardiovascular: Positive for chest pain. Negative for palpitations, orthopnea, leg swelling and PND.  Gastrointestinal: Negative for abdominal pain, blood in stool, constipation, diarrhea, heartburn, melena and nausea.  Genitourinary: Negative for dysuria, frequency, hematuria and urgency.  Musculoskeletal: Negative for back pain, joint pain, myalgias and neck pain.  Skin: Negative for rash.  Neurological: Negative for dizziness, tingling, sensory change, focal weakness and headaches.  Endo/Heme/Allergies: Negative for environmental allergies and polydipsia. Does not bruise/bleed easily.  Psychiatric/Behavioral: Negative for depression and suicidal ideas. The patient is not nervous/anxious and does not have insomnia.      Objective  Vitals:   08/30/16 0919  BP: 110/62  Pulse: (!) 56  Weight: 163 lb (73.9 kg)  Height: 5\' 4"  (1.626 m)    Physical Exam  Constitutional: She is well-developed, well-nourished, and in no distress.  Neck: Normal range of motion. Neck supple. No thyromegaly present.  Cardiovascular: Normal rate, regular rhythm and normal heart sounds.  Exam reveals no gallop and no friction rub.   No murmur heard. Pulmonary/Chest: Effort normal and breath sounds normal. No respiratory distress. She has no wheezes. She has no rales. She exhibits tenderness.  Abdominal: There is no tenderness.  Nursing note and vitals reviewed.     Assessment & Plan  Problem List Items Addressed This Visit    None    Visit Diagnoses  Closed fracture of multiple ribs of right side with routine healing, subsequent encounter    -  Primary    I spent 15 minutes with this patient, More than 50% of that time was spent in face to face education, counseling and care coordination.    Dr. Macon [facility]  08/30/16

## 2016-09-17 ENCOUNTER — Other Ambulatory Visit: Payer: Medicare Other

## 2016-09-17 DIAGNOSIS — Z7901 Long term (current) use of anticoagulants: Secondary | ICD-10-CM

## 2016-09-18 LAB — PROTIME-INR
INR: 2.4 — AB (ref 0.8–1.2)
Prothrombin Time: 23.9 s — ABNORMAL HIGH (ref 9.1–12.0)

## 2016-10-14 ENCOUNTER — Other Ambulatory Visit: Payer: Self-pay | Admitting: Family Medicine

## 2016-10-14 ENCOUNTER — Other Ambulatory Visit: Payer: Medicare Other

## 2016-10-14 DIAGNOSIS — Z7901 Long term (current) use of anticoagulants: Secondary | ICD-10-CM

## 2016-10-15 LAB — PROTIME-INR
INR: 3.1 — AB (ref 0.8–1.2)
Prothrombin Time: 30.5 s — ABNORMAL HIGH (ref 9.1–12.0)

## 2016-10-30 ENCOUNTER — Encounter: Payer: Self-pay | Admitting: Family Medicine

## 2016-10-30 ENCOUNTER — Ambulatory Visit (INDEPENDENT_AMBULATORY_CARE_PROVIDER_SITE_OTHER): Payer: Medicare Other | Admitting: Family Medicine

## 2016-10-30 VITALS — BP 120/80 | HR 60 | Ht 64.0 in | Wt 165.0 lb

## 2016-10-30 DIAGNOSIS — I82409 Acute embolism and thrombosis of unspecified deep veins of unspecified lower extremity: Secondary | ICD-10-CM

## 2016-10-30 DIAGNOSIS — I482 Chronic atrial fibrillation, unspecified: Secondary | ICD-10-CM

## 2016-10-30 DIAGNOSIS — Z7901 Long term (current) use of anticoagulants: Secondary | ICD-10-CM

## 2016-10-30 DIAGNOSIS — I10 Essential (primary) hypertension: Secondary | ICD-10-CM | POA: Diagnosis not present

## 2016-10-30 DIAGNOSIS — K219 Gastro-esophageal reflux disease without esophagitis: Secondary | ICD-10-CM

## 2016-10-30 DIAGNOSIS — E876 Hypokalemia: Secondary | ICD-10-CM

## 2016-10-30 MED ORDER — POTASSIUM CHLORIDE CRYS ER 20 MEQ PO TBCR: 20.0000 meq | EXTENDED_RELEASE_TABLET | Freq: Two times a day (BID) | ORAL | 5 refills | Status: DC

## 2016-10-30 MED ORDER — WARFARIN SODIUM 2 MG PO TABS: 2.0000 mg | ORAL_TABLET | Freq: Every day | ORAL | 5 refills | Status: DC

## 2016-10-30 MED ORDER — AMLODIPINE BESYLATE 5 MG PO TABS: 5.0000 mg | ORAL_TABLET | Freq: Every day | ORAL | 5 refills | Status: DC

## 2016-10-30 MED ORDER — HYDROCHLOROTHIAZIDE 25 MG PO TABS: 25.0000 mg | ORAL_TABLET | Freq: Every day | ORAL | 5 refills | Status: DC

## 2016-10-30 MED ORDER — RANITIDINE HCL 300 MG PO TABS: 300.0000 mg | ORAL_TABLET | Freq: Every day | ORAL | 5 refills | Status: DC

## 2016-10-30 NOTE — Progress Notes (Signed)
Name: Laurie Fuentes   MRN: [id number]    DOB: January 08, 1926   Date:10/30/2016       Progress Note  Subjective  Chief Complaint  Chief Complaint  Patient presents with  . Gastroesophageal Reflux  . Hypertension  . hypokalemia  . Atrial Fibrillation    Gastroesophageal Reflux  She reports no abdominal pain, no belching, no chest pain, no choking, no coughing, no dysphagia, no early satiety, no globus sensation, no heartburn, no hoarse voice, no nausea, no sore throat or no wheezing. This is a chronic problem. The current episode started more than 1 year ago. The problem occurs occasionally. The problem has been gradually improving. The symptoms are aggravated by certain foods. Pertinent negatives include no anemia, melena or weight loss. There are no known risk factors. She has tried a histamine-2 antagonist for the symptoms. The treatment provided moderate relief.  Hypertension  This is a chronic problem. The current episode started more than 1 year ago. The problem has been gradually improving since onset. The problem is controlled. Pertinent negatives include no blurred vision, chest pain, headaches, malaise/fatigue, neck pain, palpitations or shortness of breath. Past treatments include diuretics and calcium channel blockers. The current treatment provides moderate improvement. There are no compliance problems.  There is no history of angina, kidney disease, CAD/MI, CVA, heart failure, left ventricular hypertrophy, PVD, renovascular disease or retinopathy. There is no history of chronic renal disease or a hypertension causing med.  Atrial Fibrillation  Presents for follow-up visit. Symptoms include hypertension. Symptoms are negative for bradycardia, chest pain, dizziness, palpitations, shortness of breath, tachycardia and weakness. The symptoms have been stable. Past medical history includes atrial fibrillation.    No problem-specific Assessment & Plan notes found for this encounter.   Past  Medical History:  Diagnosis Date  . GERD (gastroesophageal reflux disease)   . Hypertension   . Hypokalemia   . Stroke [facility])     Past Surgical History:  Procedure Laterality Date  . GALLBLADDER SURGERY    . HIP FRACTURE SURGERY Right     Family History  Problem Relation Age of Onset  . Diabetes Sister     Social History   Social History  . Marital status: Widowed    Spouse name: N/A  . Number of children: N/A  . Years of education: N/A   Occupational History  . Not on file.   Social History Main Topics  . Smoking status: Never Smoker  . Smokeless tobacco: Never Used  . Alcohol use No  . Drug use: No  . Sexual activity: No   Other Topics Concern  . Not on file   Social History Narrative  . No narrative on file    No Known Allergies   Review of Systems  Constitutional: Negative for chills, fever, malaise/fatigue and weight loss.  HENT: Negative for ear discharge, ear pain, hoarse voice and sore throat.   Eyes: Negative for blurred vision.  Respiratory: Negative for cough, sputum production, choking, shortness of breath and wheezing.   Cardiovascular: Negative for chest pain, palpitations and leg swelling.  Gastrointestinal: Negative for abdominal pain, blood in stool, constipation, diarrhea, dysphagia, heartburn, melena and nausea.  Genitourinary: Negative for dysuria, frequency, hematuria and urgency.  Musculoskeletal: Negative for back pain, joint pain, myalgias and neck pain.  Skin: Negative for rash.  Neurological: Negative for dizziness, tingling, sensory change, focal weakness, weakness and headaches.  Endo/Heme/Allergies: Negative for environmental allergies and polydipsia. Does not bruise/bleed easily.  Psychiatric/Behavioral: Negative for depression  and suicidal ideas. The patient is not nervous/anxious and does not have insomnia.      Objective  Vitals:   10/30/16 1015  BP: 120/80  Pulse: 60  Weight: 165 lb (74.8 kg)  Height: 5\' 4"  (1.626  m)    Physical Exam  Constitutional: She is well-developed, well-nourished, and in no distress. No distress.  HENT:  Head: Normocephalic and atraumatic.  Right Ear: External ear normal.  Left Ear: External ear normal.  Nose: Nose normal.  Mouth/Throat: Oropharynx is clear and moist.  Eyes: Conjunctivae and EOM are normal. Pupils are equal, round, and reactive to light. Right eye exhibits no discharge. Left eye exhibits no discharge.  Neck: Normal range of motion. Neck supple. No JVD present. No thyromegaly present.  Cardiovascular: Normal rate, regular rhythm, normal heart sounds and intact distal pulses.  Exam reveals no gallop and no friction rub.   No murmur heard. Pulmonary/Chest: Effort normal and breath sounds normal. She has no wheezes. She has no rales.  Abdominal: Soft. Bowel sounds are normal. She exhibits no mass. There is no tenderness. There is no guarding.  Musculoskeletal: Normal range of motion. She exhibits no edema.  Lymphadenopathy:    She has no cervical adenopathy.  Neurological: She is alert. She has normal reflexes.  Skin: Skin is warm and dry. She is not diaphoretic.  Psychiatric: Mood and affect normal.  Nursing note and vitals reviewed.     Assessment & Plan  Problem List Items Addressed This Visit      Cardiovascular and Mediastinum   Deep vein thrombosis of lower extremity (HCC)   Relevant Medications   amLODipine (NORVASC) 5 MG tablet   hydrochlorothiazide (HYDRODIURIL) 25 MG tablet   warfarin (COUMADIN) 2 MG tablet   Essential hypertension   Relevant Medications   amLODipine (NORVASC) 5 MG tablet   hydrochlorothiazide (HYDRODIURIL) 25 MG tablet   warfarin (COUMADIN) 2 MG tablet   Other Relevant Orders   Renal Function Panel   Chronic atrial fibrillation (HCC) - Primary   Relevant Medications   amLODipine (NORVASC) 5 MG tablet   hydrochlorothiazide (HYDRODIURIL) 25 MG tablet   warfarin (COUMADIN) 2 MG tablet   Other Relevant Orders    INR/PT     Digestive   GERD (gastroesophageal reflux disease)   Relevant Medications   ranitidine (ZANTAC) 300 MG tablet     Other   Anticoagulant long-term use   Relevant Orders   INR/PT   Hypokalemia    Other Visit Diagnoses    Hypokalemia due to loss of potassium       Relevant Medications   potassium chloride SA (K-DUR,KLOR-CON) 20 MEQ tablet        Dr. Klare Criss [facility]  10/30/16

## 2016-10-31 ENCOUNTER — Other Ambulatory Visit: Payer: Self-pay

## 2016-10-31 LAB — RENAL FUNCTION PANEL
ALBUMIN: 4.4 g/dL (ref 3.5–4.7)
BUN/Creatinine Ratio: 28 (ref 12–28)
BUN: 19 mg/dL (ref 8–27)
CO2: 28 mmol/L (ref 18–29)
CREATININE: 0.69 mg/dL (ref 0.57–1.00)
Calcium: 9.8 mg/dL (ref 8.7–10.3)
Chloride: 92 mmol/L — ABNORMAL LOW (ref 96–106)
GFR, EST AFRICAN AMERICAN: 90 mL/min/{1.73_m2} (ref >59)
GFR, EST NON AFRICAN AMERICAN: 78 mL/min/{1.73_m2} (ref >59)
GLUCOSE: 86 mg/dL (ref 65–99)
POTASSIUM: 4.6 mmol/L (ref 3.5–5.2)
Phosphorus: 3.8 mg/dL (ref 2.5–4.5)
Sodium: 136 mmol/L (ref 134–144)

## 2016-10-31 LAB — PROTIME-INR
INR: 2 — ABNORMAL HIGH (ref 0.8–1.2)
PROTHROMBIN TIME: 20 s — AB (ref 9.1–12.0)

## 2016-11-27 ENCOUNTER — Other Ambulatory Visit: Payer: Medicare Other

## 2016-11-27 DIAGNOSIS — Z7901 Long term (current) use of anticoagulants: Secondary | ICD-10-CM

## 2016-11-28 LAB — PROTIME-INR
INR: 1.9 — AB (ref 0.8–1.2)
PROTHROMBIN TIME: 19.6 s — AB (ref 9.1–12.0)

## 2016-12-25 ENCOUNTER — Other Ambulatory Visit: Payer: Medicare Other

## 2016-12-25 DIAGNOSIS — Z7901 Long term (current) use of anticoagulants: Secondary | ICD-10-CM

## 2016-12-28 LAB — PROTIME-INR
INR: 1.8 — AB (ref 0.8–1.2)
PROTHROMBIN TIME: 18.7 s — AB (ref 9.1–12.0)

## 2017-01-08 ENCOUNTER — Other Ambulatory Visit: Payer: Medicare Other

## 2017-01-08 DIAGNOSIS — Z7901 Long term (current) use of anticoagulants: Secondary | ICD-10-CM

## 2017-01-09 ENCOUNTER — Other Ambulatory Visit: Payer: Self-pay

## 2017-01-09 ENCOUNTER — Other Ambulatory Visit: Payer: Self-pay | Admitting: Family Medicine

## 2017-01-09 DIAGNOSIS — I82409 Acute embolism and thrombosis of unspecified deep veins of unspecified lower extremity: Secondary | ICD-10-CM

## 2017-01-09 LAB — PROTIME-INR
INR: 1.6 — AB (ref 0.8–1.2)
PROTHROMBIN TIME: 16.2 s — AB (ref 9.1–12.0)

## 2017-01-09 MED ORDER — WARFARIN SODIUM 3 MG PO TABS: 3.0000 mg | ORAL_TABLET | Freq: Every day | ORAL | 5 refills | Status: DC

## 2017-01-21 ENCOUNTER — Other Ambulatory Visit: Payer: Medicare Other

## 2017-01-21 DIAGNOSIS — Z7901 Long term (current) use of anticoagulants: Secondary | ICD-10-CM

## 2017-01-22 LAB — PROTIME-INR
INR: 2.8 — AB (ref 0.8–1.2)
PROTHROMBIN TIME: 27.7 s — AB (ref 9.1–12.0)

## 2017-01-24 ENCOUNTER — Other Ambulatory Visit: Payer: Self-pay

## 2017-02-04 ENCOUNTER — Other Ambulatory Visit: Payer: Medicare Other

## 2017-02-04 DIAGNOSIS — Z7901 Long term (current) use of anticoagulants: Secondary | ICD-10-CM

## 2017-02-05 LAB — PROTIME-INR
INR: 3.3 — AB (ref 0.8–1.2)
Prothrombin Time: 32 s — ABNORMAL HIGH (ref 9.1–12.0)

## 2017-02-18 ENCOUNTER — Other Ambulatory Visit: Payer: Medicare Other

## 2017-02-18 DIAGNOSIS — I4891 Unspecified atrial fibrillation: Secondary | ICD-10-CM

## 2017-02-18 DIAGNOSIS — R69 Illness, unspecified: Secondary | ICD-10-CM

## 2017-02-19 LAB — PROTIME-INR
INR: 1.7 — ABNORMAL HIGH (ref 0.8–1.2)
PROTHROMBIN TIME: 17.1 s — AB (ref 9.1–12.0)

## 2017-03-04 ENCOUNTER — Other Ambulatory Visit: Payer: Medicare Other

## 2017-03-04 DIAGNOSIS — Z7901 Long term (current) use of anticoagulants: Secondary | ICD-10-CM

## 2017-03-05 LAB — PROTIME-INR
INR: 2.4 — AB (ref 0.8–1.2)
PROTHROMBIN TIME: 24.4 s — AB (ref 9.1–12.0)

## 2017-03-18 ENCOUNTER — Other Ambulatory Visit: Payer: Medicare Other

## 2017-03-18 DIAGNOSIS — Z7901 Long term (current) use of anticoagulants: Secondary | ICD-10-CM | POA: Diagnosis not present

## 2017-03-19 LAB — PROTIME-INR
INR: 2.4 — ABNORMAL HIGH (ref 0.8–1.2)
PROTHROMBIN TIME: 23.6 s — AB (ref 9.1–12.0)

## 2017-04-15 ENCOUNTER — Other Ambulatory Visit: Payer: Medicare Other

## 2017-04-15 DIAGNOSIS — Z7901 Long term (current) use of anticoagulants: Secondary | ICD-10-CM | POA: Diagnosis not present

## 2017-04-16 ENCOUNTER — Other Ambulatory Visit: Payer: Medicare Other

## 2017-04-16 LAB — PROTIME-INR
INR: 3 — ABNORMAL HIGH (ref 0.8–1.2)
Prothrombin Time: 29.2 s — ABNORMAL HIGH (ref 9.1–12.0)

## 2017-05-13 ENCOUNTER — Ambulatory Visit (INDEPENDENT_AMBULATORY_CARE_PROVIDER_SITE_OTHER): Payer: Medicare Other | Admitting: Family Medicine

## 2017-05-13 ENCOUNTER — Encounter: Payer: Self-pay | Admitting: Family Medicine

## 2017-05-13 VITALS — BP 100/62 | HR 74 | Ht 64.0 in | Wt 160.0 lb

## 2017-05-13 DIAGNOSIS — L03113 Cellulitis of right upper limb: Secondary | ICD-10-CM | POA: Diagnosis not present

## 2017-05-13 DIAGNOSIS — E782 Mixed hyperlipidemia: Secondary | ICD-10-CM | POA: Diagnosis not present

## 2017-05-13 DIAGNOSIS — C4491 Basal cell carcinoma of skin, unspecified: Secondary | ICD-10-CM | POA: Diagnosis not present

## 2017-05-13 DIAGNOSIS — I10 Essential (primary) hypertension: Secondary | ICD-10-CM

## 2017-05-13 DIAGNOSIS — E876 Hypokalemia: Secondary | ICD-10-CM | POA: Diagnosis not present

## 2017-05-13 DIAGNOSIS — K219 Gastro-esophageal reflux disease without esophagitis: Secondary | ICD-10-CM | POA: Diagnosis not present

## 2017-05-13 MED ORDER — RANITIDINE HCL 300 MG PO TABS: 300.0000 mg | ORAL_TABLET | Freq: Every day | ORAL | 5 refills | Status: DC

## 2017-05-13 MED ORDER — HYDROCHLOROTHIAZIDE 25 MG PO TABS: 25.0000 mg | ORAL_TABLET | Freq: Every day | ORAL | 5 refills | Status: DC

## 2017-05-13 MED ORDER — AMLODIPINE BESYLATE 5 MG PO TABS: 5.0000 mg | ORAL_TABLET | Freq: Every day | ORAL | 5 refills | Status: DC

## 2017-05-13 MED ORDER — MUPIROCIN 2 % EX OINT: 1.0000 "application " | TOPICAL_OINTMENT | Freq: Two times a day (BID) | CUTANEOUS | 0 refills | Status: DC

## 2017-05-13 MED ORDER — POTASSIUM CHLORIDE CRYS ER 20 MEQ PO TBCR: 20.0000 meq | EXTENDED_RELEASE_TABLET | Freq: Two times a day (BID) | ORAL | 5 refills | Status: DC

## 2017-05-13 MED ORDER — CEPHALEXIN 500 MG PO CAPS: 500.0000 mg | ORAL_CAPSULE | Freq: Two times a day (BID) | ORAL | 0 refills | Status: DC

## 2017-05-13 NOTE — Progress Notes (Signed)
Name: Laurie Fuentes   MRN: [id number]    DOB: [birth year]   Date:05/13/2017       Progress Note  Subjective  Chief Complaint  Chief Complaint  Patient presents with  . Hypertension  . Gastroesophageal Reflux  . Atrial Fibrillation  . hypokalemia    Hypertension  This is a chronic problem. The current episode started more than 1 year ago. The problem is unchanged. The problem is controlled. Pertinent negatives include no anxiety, blurred vision, chest pain, headaches, malaise/fatigue, neck pain, orthopnea, palpitations, peripheral edema, PND, shortness of breath or sweats. There are no associated agents to hypertension. There are no known risk factors for coronary artery disease. The current treatment provides mild improvement. There are no compliance problems.  There is no history of angina, kidney disease, CAD/MI, CVA, heart failure, left ventricular hypertrophy, PVD or retinopathy. There is no history of chronic renal disease, a hypertension causing med or renovascular disease.  Gastroesophageal Reflux  She reports no abdominal pain, no belching, no chest pain, no choking, no coughing, no dysphagia, no early satiety, no globus sensation, no heartburn, no nausea, no sore throat, no stridor, no tooth decay, no water brash or no wheezing. This is a new problem. The problem has been waxing and waning. The symptoms are aggravated by certain foods. Pertinent negatives include no melena or weight loss. She has tried a histamine-2 antagonist for the symptoms. The treatment provided mild relief.  Atrial Fibrillation  Presents for follow-up visit. Symptoms include hypertension. Symptoms are negative for bradycardia, chest pain, dizziness, hypotension, palpitations and shortness of breath. The symptoms have been stable. Past medical history includes atrial fibrillation.  Rash  This is a chronic problem. The current episode started more than 1 month ago. The problem has been waxing and waning since onset.  The affected locations include the right hand. The rash is characterized by scaling and redness. Pertinent negatives include no cough, diarrhea, fever, joint pain, shortness of breath or sore throat.    No problem-specific Assessment & Plan notes found for this encounter.   Past Medical History:  Diagnosis Date  . GERD (gastroesophageal reflux disease)   . Hypertension   . Hypokalemia   . Stroke [facility])     Past Surgical History:  Procedure Laterality Date  . GALLBLADDER SURGERY    . HIP FRACTURE SURGERY Right     Family History  Problem Relation Age of Onset  . Diabetes Sister     Social History   Social History  . Marital status: Widowed    Spouse name: N/A  . Number of children: N/A  . Years of education: N/A   Occupational History  . Not on file.   Social History Main Topics  . Smoking status: Never Smoker  . Smokeless tobacco: Never Used  . Alcohol use No  . Drug use: No  . Sexual activity: No   Other Topics Concern  . Not on file   Social History Narrative  . No narrative on file    No Known Allergies  Outpatient Medications Prior to Visit  Medication Sig Dispense Refill  . warfarin (COUMADIN) 1 MG tablet Take 1 tablet (1 mg total) by mouth daily. Use as directed 30 tablet 1  . warfarin (COUMADIN) 2 MG tablet Take 1 tablet (2 mg total) by mouth daily. 30 tablet 5  . warfarin (COUMADIN) 3 MG tablet Take 1 tablet (3 mg total) by mouth daily. 30 tablet 5  . amLODipine (NORVASC) 5 MG tablet Take  1 tablet (5 mg total) by mouth daily. 30 tablet 5  . hydrochlorothiazide (HYDRODIURIL) 25 MG tablet Take 1 tablet (25 mg total) by mouth daily. 30 tablet 5  . potassium chloride SA (K-DUR,KLOR-CON) 20 MEQ tablet Take 1 tablet (20 mEq total) by mouth 2 (two) times daily. 60 tablet 5  . ranitidine (ZANTAC) 300 MG tablet Take 1 tablet (300 mg total) by mouth at bedtime. 30 tablet 5   Facility-Administered Medications Prior to Visit  Medication Dose Route Frequency  Provider Last Rate Last Dose  . carbamide peroxide (DEBROX) 6.5 % otic solution 5 drop  5 drop Left EAR Once Juline Patch, MD        Review of Systems  Constitutional: Negative for chills, fever, malaise/fatigue and weight loss.  HENT: Negative for ear discharge, ear pain and sore throat.   Eyes: Negative for blurred vision.  Respiratory: Negative for cough, sputum production, choking, shortness of breath and wheezing.   Cardiovascular: Negative for chest pain, palpitations, orthopnea, leg swelling and PND.  Gastrointestinal: Negative for abdominal pain, blood in stool, constipation, diarrhea, dysphagia, heartburn, melena and nausea.  Genitourinary: Negative for dysuria, frequency, hematuria and urgency.  Musculoskeletal: Negative for back pain, joint pain, myalgias and neck pain.  Skin: Negative for rash.  Neurological: Negative for dizziness, tingling, sensory change, focal weakness and headaches.  Endo/Heme/Allergies: Negative for environmental allergies and polydipsia. Does not bruise/bleed easily.  Psychiatric/Behavioral: Negative for depression and suicidal ideas. The patient is not nervous/anxious and does not have insomnia.      Objective  Vitals:   05/13/17 1039  BP: 100/62  Pulse: 74  Weight: 160 lb (72.6 kg)  Height: 5\' 4"  (1.626 m)    Physical Exam  Constitutional: She is well-developed, well-nourished, and in no distress. No distress.  HENT:  Head: Normocephalic and atraumatic.  Right Ear: External ear normal.  Left Ear: External ear normal.  Nose: Nose normal.  Mouth/Throat: Oropharynx is clear and moist.  Eyes: Conjunctivae and EOM are normal. Pupils are equal, round, and reactive to light. Right eye exhibits no discharge. Left eye exhibits no discharge.  Neck: Normal range of motion. Neck supple. No JVD present. No thyromegaly present.  Cardiovascular: Normal rate, regular rhythm, normal heart sounds and intact distal pulses.  Exam reveals no gallop and no  friction rub.   No murmur heard. Pulmonary/Chest: Effort normal and breath sounds normal. She has no wheezes. She has no rales.  Abdominal: Soft. Bowel sounds are normal. She exhibits no mass. There is no tenderness. There is no guarding.  Musculoskeletal: Normal range of motion. She exhibits no edema.  Lymphadenopathy:    She has no cervical adenopathy.  Neurological: She is alert. She has normal reflexes.  Skin: Skin is warm and dry. Abrasion noted. She is not diaphoretic. There is erythema.  Psychiatric: Mood and affect normal.  Nursing note and vitals reviewed.     Assessment & Plan  Problem List Items Addressed This Visit      Cardiovascular and Mediastinum   Essential hypertension - Primary   Relevant Medications   hydrochlorothiazide (HYDRODIURIL) 25 MG tablet   amLODipine (NORVASC) 5 MG tablet   Other Relevant Orders   Renal Function Panel     Digestive   GERD (gastroesophageal reflux disease)   Relevant Medications   ranitidine (ZANTAC) 300 MG tablet    Other Visit Diagnoses    Hypokalemia due to loss of potassium       Relevant Medications   potassium  chloride SA (K-DUR,KLOR-CON) 20 MEQ tablet   Mixed hyperlipidemia       Relevant Medications   hydrochlorothiazide (HYDRODIURIL) 25 MG tablet   amLODipine (NORVASC) 5 MG tablet   Other Relevant Orders   Lipid Profile   Basal cell adenocarcinoma       Relevant Medications   cephALEXin (KEFLEX) 500 MG capsule   Other Relevant Orders   Ambulatory referral to Dermatology   Cellulitis of right upper extremity          Meds ordered this encounter  Medications  . hydrochlorothiazide (HYDRODIURIL) 25 MG tablet    Sig: Take 1 tablet (25 mg total) by mouth daily.    Dispense:  30 tablet    Refill:  5  . amLODipine (NORVASC) 5 MG tablet    Sig: Take 1 tablet (5 mg total) by mouth daily.    Dispense:  30 tablet    Refill:  5  . ranitidine (ZANTAC) 300 MG tablet    Sig: Take 1 tablet (300 mg total) by mouth at  bedtime.    Dispense:  30 tablet    Refill:  5  . potassium chloride SA (K-DUR,KLOR-CON) 20 MEQ tablet    Sig: Take 1 tablet (20 mEq total) by mouth 2 (two) times daily.    Dispense:  60 tablet    Refill:  5  . mupirocin ointment (BACTROBAN) 2 %    Sig: Apply 1 application topically 2 (two) times daily.    Dispense:  22 g    Refill:  0  . cephALEXin (KEFLEX) 500 MG capsule    Sig: Take 1 capsule (500 mg total) by mouth 2 (two) times daily.    Dispense:  20 capsule    Refill:  0      Dr. Otilio Miu [facility]  05/13/17

## 2017-05-14 LAB — RENAL FUNCTION PANEL
Albumin: 3.9 g/dL (ref 3.5–4.7)
BUN / CREAT RATIO: 21 (ref 12–28)
BUN: 16 mg/dL (ref 8–27)
CALCIUM: 8.4 mg/dL — AB (ref 8.7–10.3)
CO2: 15 mmol/L — ABNORMAL LOW (ref 20–29)
CREATININE: 0.75 mg/dL (ref 0.57–1.00)
Chloride: 75 mmol/L — ABNORMAL LOW (ref 96–106)
GFR, EST AFRICAN AMERICAN: 82 mL/min/{1.73_m2} (ref >59)
GFR, EST NON AFRICAN AMERICAN: 71 mL/min/{1.73_m2} (ref >59)
Glucose: 86 mg/dL (ref 65–99)
Phosphorus: 2.9 mg/dL (ref 2.5–4.5)
Potassium: 4.1 mmol/L (ref 3.5–5.2)
SODIUM: 157 mmol/L — AB (ref 134–144)

## 2017-05-14 LAB — LIPID PANEL
CHOL/HDL RATIO: 3 ratio (ref 0.0–4.4)
Cholesterol, Total: 158 mg/dL (ref 100–199)
HDL: 52 mg/dL (ref >39)
LDL CALC: 86 mg/dL (ref 0–99)
Triglycerides: 98 mg/dL (ref 0–149)
VLDL Cholesterol Cal: 20 mg/dL (ref 5–40)

## 2017-05-21 DIAGNOSIS — L280 Lichen simplex chronicus: Secondary | ICD-10-CM | POA: Diagnosis not present

## 2017-05-28 DIAGNOSIS — L280 Lichen simplex chronicus: Secondary | ICD-10-CM | POA: Diagnosis not present

## 2017-05-28 DIAGNOSIS — D0461 Carcinoma in situ of skin of right upper limb, including shoulder: Secondary | ICD-10-CM | POA: Diagnosis not present

## 2017-06-10 ENCOUNTER — Other Ambulatory Visit: Payer: Medicare Other

## 2017-06-10 DIAGNOSIS — I4891 Unspecified atrial fibrillation: Secondary | ICD-10-CM

## 2017-06-11 LAB — PROTIME-INR
INR: 2.9 — AB (ref 0.8–1.2)
Prothrombin Time: 28 s — ABNORMAL HIGH (ref 9.1–12.0)

## 2017-06-25 DIAGNOSIS — D0461 Carcinoma in situ of skin of right upper limb, including shoulder: Secondary | ICD-10-CM | POA: Diagnosis not present

## 2017-07-08 ENCOUNTER — Other Ambulatory Visit: Payer: Medicare Other

## 2017-07-08 DIAGNOSIS — I482 Chronic atrial fibrillation, unspecified: Secondary | ICD-10-CM

## 2017-07-09 LAB — PROTIME-INR
INR: 2.7 — ABNORMAL HIGH (ref 0.8–1.2)
PROTHROMBIN TIME: 26.5 s — AB (ref 9.1–12.0)

## 2017-07-30 ENCOUNTER — Encounter: Payer: Self-pay | Admitting: Emergency Medicine

## 2017-07-30 ENCOUNTER — Emergency Department
Admission: EM | Admit: 2017-07-30 | Discharge: 2017-07-30 | Disposition: A | Discharge status: Home / Self Care | Payer: Medicare Other | Attending: Emergency Medicine | Admitting: Emergency Medicine

## 2017-07-30 ENCOUNTER — Emergency Department: Payer: Medicare Other

## 2017-07-30 DIAGNOSIS — Y999 Unspecified external cause status: Secondary | ICD-10-CM | POA: Insufficient documentation

## 2017-07-30 DIAGNOSIS — I10 Essential (primary) hypertension: Secondary | ICD-10-CM | POA: Insufficient documentation

## 2017-07-30 DIAGNOSIS — W01198A Fall on same level from slipping, tripping and stumbling with subsequent striking against other object, initial encounter: Secondary | ICD-10-CM | POA: Insufficient documentation

## 2017-07-30 DIAGNOSIS — Y92002 Bathroom of unspecified non-institutional (private) residence single-family (private) house as the place of occurrence of the external cause: Secondary | ICD-10-CM | POA: Insufficient documentation

## 2017-07-30 DIAGNOSIS — Z8673 Personal history of transient ischemic attack (TIA), and cerebral infarction without residual deficits: Secondary | ICD-10-CM | POA: Insufficient documentation

## 2017-07-30 DIAGNOSIS — Z79899 Other long term (current) drug therapy: Secondary | ICD-10-CM | POA: Insufficient documentation

## 2017-07-30 DIAGNOSIS — Y939 Activity, unspecified: Secondary | ICD-10-CM | POA: Insufficient documentation

## 2017-07-30 DIAGNOSIS — S20212A Contusion of left front wall of thorax, initial encounter: Secondary | ICD-10-CM | POA: Insufficient documentation

## 2017-07-30 DIAGNOSIS — S29091A Other injury of muscle and tendon of front wall of thorax, initial encounter: Secondary | ICD-10-CM | POA: Diagnosis present

## 2017-07-30 DIAGNOSIS — S299XXA Unspecified injury of thorax, initial encounter: Secondary | ICD-10-CM | POA: Diagnosis not present

## 2017-07-30 DIAGNOSIS — Z7901 Long term (current) use of anticoagulants: Secondary | ICD-10-CM | POA: Insufficient documentation

## 2017-07-30 DIAGNOSIS — S20211A Contusion of right front wall of thorax, initial encounter: Secondary | ICD-10-CM | POA: Diagnosis not present

## 2017-07-30 MED ORDER — TRAMADOL-ACETAMINOPHEN 37.5-325 MG PO TABS: 1.0000 | ORAL_TABLET | Freq: Two times a day (BID) | ORAL | 0 refills | Status: DC

## 2017-07-30 MED ORDER — TRAMADOL-ACETAMINOPHEN 37.5-325 MG PO TABS
1.0000 | ORAL_TABLET | Freq: Once | ORAL | Status: AC
  Administered 2017-07-30: 1 via ORAL
  Filled 2017-07-30: qty 1

## 2017-07-30 NOTE — ED Provider Notes (Signed)
[facility] Emergency Department Provider Note   ____________________________________________   First MD Initiated Contact with Patient 07/30/17 1050     (approximate)  I have reviewed the triage vital signs and the nursing notes.   HISTORY  Chief Complaint Rib Injury    HPI Laurie Fuentes is a 83 y.o. female patient complaining of left rib pain secondary to a fall 2 days ago. Patient states loss of balance and fell hitting his left ribs on the tub. Patient state pain  increasedwith movements and deep breathing. Patient rates pain as 8/10. Patient described a pain as "achy". No palliative measures for complaint.   Past Medical History:  Diagnosis Date  . GERD (gastroesophageal reflux disease)   . Hypertension   . Hypokalemia   . Stroke [facility])     Patient Active Problem List   Diagnosis Date Noted  . Chronic atrial fibrillation ([place]) 05/28/2016  . Hypokalemia 05/28/2016  . Anticoagulant long-term use 04/02/2016  . Essential hypertension 11/29/2015  . Cerebral vascular disease 11/29/2015  . GERD (gastroesophageal reflux disease) 06/14/2015  . Deep vein thrombosis of lower extremity ([place]) 06/24/2014    Past Surgical History:  Procedure Laterality Date  . GALLBLADDER SURGERY    . HIP FRACTURE SURGERY Right     Prior to Admission medications   Medication Sig Start Date End Date Taking? Authorizing Provider  amLODipine (NORVASC) 5 MG tablet Take 1 tablet (5 mg total) by mouth daily. 05/13/17   Juline Patch, MD  cephALEXin (KEFLEX) 500 MG capsule Take 1 capsule (500 mg total) by mouth 2 (two) times daily. 05/13/17   Juline Patch, MD  hydrochlorothiazide (HYDRODIURIL) 25 MG tablet Take 1 tablet (25 mg total) by mouth daily. 05/13/17   Juline Patch, MD  mupirocin ointment (BACTROBAN) 2 % Apply 1 application topically 2 (two) times daily. 05/13/17   Juline Patch, MD  potassium chloride SA (K-DUR,KLOR-CON) 20 MEQ tablet Take 1 tablet (20 mEq  total) by mouth 2 (two) times daily. 05/13/17   Juline Patch, MD  ranitidine (ZANTAC) 300 MG tablet Take 1 tablet (300 mg total) by mouth at bedtime. 05/13/17   Juline Patch, MD  traMADol-acetaminophen (ULTRACET) 37.5-325 MG tablet Take 1 tablet by mouth 2 (two) times daily. 07/30/17   Sable Feil, PA-C  warfarin (COUMADIN) 1 MG tablet Take 1 tablet (1 mg total) by mouth daily. Use as directed 02/07/16   Juline Patch, MD  warfarin (COUMADIN) 2 MG tablet Take 1 tablet (2 mg total) by mouth daily. 10/30/16   Juline Patch, MD  warfarin (COUMADIN) 3 MG tablet Take 1 tablet (3 mg total) by mouth daily. 01/09/17   Juline Patch, MD    Allergies Patient has no known allergies.  Family History  Problem Relation Age of Onset  . Diabetes Sister     Social History Social History  Substance Use Topics  . Smoking status: Never Smoker  . Smokeless tobacco: Never Used  . Alcohol use No    Review of Systems Constitutional: No fever/chills Eyes: No visual changes. ENT: No sore throat. Cardiovascular: Denies chest pain. Respiratory: Denies shortness of breath. Gastrointestinal: No abdominal pain.  No nausea, no vomiting.  No diarrhea.  No constipation. Genitourinary: Negative for dysuria. Musculoskeletal: Negative for back pain. Skin: Negative for rash. Neurological: Negative for headaches, focal weakness or numbness. Endocrine:Hypertension and hypokalemia.  ____________________________________________   PHYSICAL EXAM:  VITAL SIGNS: ED Triage Vitals  Enc Vitals  Group     BP 07/30/17 0939 125/66     Pulse Rate 07/30/17 0939 78     Resp 07/30/17 0939 18     Temp 07/30/17 0939 98.2 F (36.8 C)     Temp Source 07/30/17 0939 Oral     SpO2 07/30/17 0939 98 %     Weight 07/30/17 0939 167 lb (75.8 kg)     Height 07/30/17 0939 5\' 4"  (1.626 m)     Head Circumference --      Peak Flow --      Pain Score 07/30/17 0941 8     Pain Loc --      Pain Edu? --      Excl. in [place]? --       Constitutional: Alert and oriented. Well appearing and in no acute distress. Neck: No stridor.  No cervical spine tenderness to palpation. Cardiovascular: Normal rate, regular rhythm. Grossly normal heart sounds.  Good peripheral circulation. Respiratory: Normal respiratory effort.  No retractions. Lungs CTAB. Gastrointestinal: Soft and nontender. No distention. No abdominal bruits. No CVA tenderness. Musculoskeletal: No lower extremity tenderness nor edema.  No joint effusions. Neurologic:  Normal speech and language. No gross focal neurologic deficits are appreciated. No gait instability. Skin:  Skin is warm, dry and intact. No rash noted. No abrasions or ecchymosis Psychiatric: Mood and affect are normal. Speech and behavior are normal.  ____________________________________________   LABS (all labs ordered are listed, but only abnormal results are displayed)  Labs Reviewed - No data to display ____________________________________________  EKG   ____________________________________________  RADIOLOGY  Dg Ribs Unilateral W/chest Left  Result Date: 07/30/2017 CLINICAL DATA:  Status post fall.  Hit left side on tub. EXAM: LEFT RIBS AND CHEST - 3+ VIEW COMPARISON:  08/13/2016 FINDINGS: No fracture or other bone lesions are seen involving the ribs. There is no evidence of pneumothorax or pleural effusion. Chronic coarsened interstitial markings identified bilaterally. Heart size and mediastinal contours are within normal limits. IMPRESSION: No displaced rib fractures identified. Chronic interstitial coarsening noted bilaterally. Electronically Signed   By: Kerby Moors M.D.   On: 07/30/2017 11:45    Acute findings x-ray of left ribs ___________________________________________   PROCEDURES  Procedure(s) performed: None  Procedures  Critical Care performed: No  ____________________________________________   INITIAL IMPRESSION / ASSESSMENT AND PLAN / ED  COURSE  Pertinent labs & imaging results that were available during my care of the patient were reviewed by me and considered in my medical decision making (see chart for details).  Pain secondary to the left rear and chest wall contusion. Discussed negative x-ray findings with patient and family. Patient given discharge Instructions. Patient advised follow-up family doctor no improvement in one week. Return by ED condition worsens.      ____________________________________________   FINAL CLINICAL IMPRESSION(S) / ED DIAGNOSES  Final diagnoses:  Contusion of rib on left side, initial encounter      NEW MEDICATIONS STARTED DURING THIS VISIT:  New Prescriptions   TRAMADOL-ACETAMINOPHEN (ULTRACET) 37.5-325 MG TABLET    Take 1 tablet by mouth 2 (two) times daily.     Note:  This document was prepared using Dragon voice recognition software and may include unintentional dictation errors.    Sable Feil, PA-C 07/30/17 1214    Schuyler Amor, MD 07/30/17 1425

## 2017-07-30 NOTE — ED Triage Notes (Signed)
Fell on Monday in bathroom.  Lost balance and hit her left ribs on tub.  Pain worsening and hurts to move or breath now.

## 2017-08-01 ENCOUNTER — Ambulatory Visit (INDEPENDENT_AMBULATORY_CARE_PROVIDER_SITE_OTHER): Payer: Medicare Other | Admitting: Family Medicine

## 2017-08-01 ENCOUNTER — Encounter: Payer: Self-pay | Admitting: Family Medicine

## 2017-08-01 VITALS — BP 120/62 | HR 68 | Ht 64.0 in | Wt 167.0 lb

## 2017-08-01 DIAGNOSIS — Z09 Encounter for follow-up examination after completed treatment for conditions other than malignant neoplasm: Secondary | ICD-10-CM | POA: Diagnosis not present

## 2017-08-01 DIAGNOSIS — W19XXXD Unspecified fall, subsequent encounter: Secondary | ICD-10-CM

## 2017-08-01 DIAGNOSIS — Z23 Encounter for immunization: Secondary | ICD-10-CM | POA: Diagnosis not present

## 2017-08-01 NOTE — Progress Notes (Signed)
Name: Laurie Fuentes   MRN: [id number]    DOB: 02-07-1926   Date:08/01/2017       Progress Note  Subjective  Chief Complaint  Chief Complaint  Patient presents with  . Follow-up    fell- hitting L) side of chest on tub- xrays for rib and chest showed "no fractures"    Patient for followup from er visit for fall/chest wall injury. Discussion initiated for possible hospice /occupational therapy.   Fall  The accident occurred 3 to 5 days ago. The fall occurred while standing (fell and hit bathtub). Impact surface: bath tub. Point of impact: left chest wall. Pain location: left chest wall. The pain is moderate. Exacerbated by: deep breath/try to get up. Pertinent negatives include no abdominal pain, bowel incontinence, fever, headaches, hematuria, nausea or tingling. She has tried ice for the symptoms. The treatment provided mild relief.    No problem-specific Assessment & Plan notes found for this encounter.   Past Medical History:  Diagnosis Date  . GERD (gastroesophageal reflux disease)   . Hypertension   . Hypokalemia   . Stroke [facility])     Past Surgical History:  Procedure Laterality Date  . GALLBLADDER SURGERY    . HIP FRACTURE SURGERY Right     Family History  Problem Relation Age of Onset  . Diabetes Sister     Social History   Social History  . Marital status: Widowed    Spouse name: N/A  . Number of children: N/A  . Years of education: N/A   Occupational History  . Not on file.   Social History Main Topics  . Smoking status: Never Smoker  . Smokeless tobacco: Never Used  . Alcohol use No  . Drug use: No  . Sexual activity: No   Other Topics Concern  . Not on file   Social History Narrative  . No narrative on file    No Known Allergies  Outpatient Medications Prior to Visit  Medication Sig Dispense Refill  . amLODipine (NORVASC) 5 MG tablet Take 1 tablet (5 mg total) by mouth daily. 30 tablet 5  . hydrochlorothiazide (HYDRODIURIL) 25 MG tablet  Take 1 tablet (25 mg total) by mouth daily. 30 tablet 5  . mupirocin ointment (BACTROBAN) 2 % Apply 1 application topically 2 (two) times daily. 22 g 0  . potassium chloride SA (K-DUR,KLOR-CON) 20 MEQ tablet Take 1 tablet (20 mEq total) by mouth 2 (two) times daily. 60 tablet 5  . ranitidine (ZANTAC) 300 MG tablet Take 1 tablet (300 mg total) by mouth at bedtime. 30 tablet 5  . traMADol-acetaminophen (ULTRACET) 37.5-325 MG tablet Take 1 tablet by mouth 2 (two) times daily. 10 tablet 0  . warfarin (COUMADIN) 1 MG tablet Take 1 tablet (1 mg total) by mouth daily. Use as directed 30 tablet 1  . warfarin (COUMADIN) 2 MG tablet Take 1 tablet (2 mg total) by mouth daily. 30 tablet 5  . warfarin (COUMADIN) 3 MG tablet Take 1 tablet (3 mg total) by mouth daily. 30 tablet 5  . cephALEXin (KEFLEX) 500 MG capsule Take 1 capsule (500 mg total) by mouth 2 (two) times daily. 20 capsule 0   Facility-Administered Medications Prior to Visit  Medication Dose Route Frequency Provider Last Rate Last Dose  . carbamide peroxide (DEBROX) 6.5 % otic solution 5 drop  5 drop Left EAR Once Juline Patch, MD        Review of Systems  Constitutional: Negative for chills, fever, malaise/fatigue  and weight loss.  HENT: Negative for ear discharge, ear pain and sore throat.   Eyes: Negative for blurred vision.  Respiratory: Negative for cough, sputum production, shortness of breath and wheezing.   Cardiovascular: Negative for chest pain, palpitations and leg swelling.  Gastrointestinal: Negative for abdominal pain, blood in stool, bowel incontinence, constipation, diarrhea, heartburn, melena and nausea.  Genitourinary: Negative for dysuria, frequency, hematuria and urgency.  Musculoskeletal: Negative for back pain, joint pain, myalgias and neck pain.  Skin: Negative for rash.  Neurological: Negative for dizziness, tingling, sensory change, focal weakness and headaches.  Endo/Heme/Allergies: Negative for environmental  allergies and polydipsia. Does not bruise/bleed easily.  Psychiatric/Behavioral: Negative for depression and suicidal ideas. The patient is not nervous/anxious and does not have insomnia.      Objective  Vitals:   08/01/17 0942  BP: 120/62  Pulse: 68  Weight: 167 lb (75.8 kg)  Height: 5\' 4"  (1.626 m)    Physical Exam  Constitutional: She is well-developed, well-nourished, and in no distress. No distress.  HENT:  Head: Normocephalic and atraumatic.  Right Ear: External ear normal.  Left Ear: External ear normal.  Nose: Nose normal.  Mouth/Throat: Oropharynx is clear and moist.  Eyes: Pupils are equal, round, and reactive to light. Conjunctivae and EOM are normal. Right eye exhibits no discharge. Left eye exhibits no discharge.  Neck: Normal range of motion. Neck supple. No JVD present. No thyromegaly present.  Cardiovascular: Normal rate, regular rhythm, normal heart sounds and intact distal pulses.  Exam reveals no gallop and no friction rub.   No murmur heard. Pulmonary/Chest: Effort normal and breath sounds normal. She has no wheezes. She has no rales.  No rub over impact area  Abdominal: Soft. Bowel sounds are normal. She exhibits no mass. There is no tenderness. There is no guarding.  Musculoskeletal: Normal range of motion. She exhibits no edema.  Lymphadenopathy:    She has no cervical adenopathy.  Neurological: She is alert. She has normal reflexes.  Skin: Skin is warm and dry. She is not diaphoretic.  No contusion  Psychiatric: Mood and affect normal.  Nursing note and vitals reviewed.     Assessment & Plan  Problem List Items Addressed This Visit    None    Visit Diagnoses    Fall, subsequent encounter    -  Primary   Need for follow-up by home health service       face to face   Influenza vaccine needed       Relevant Orders   Flu vaccine HIGH DOSE PF (Fluzone High dose) (Completed)      No orders of the defined types were placed in this  encounter.     Dr. Macon [facility]  08/01/17

## 2017-08-04 ENCOUNTER — Other Ambulatory Visit: Payer: Self-pay

## 2017-08-04 ENCOUNTER — Telehealth: Payer: Self-pay

## 2017-08-04 NOTE — Telephone Encounter (Signed)
Called in refill on ultracet to [facility]

## 2017-08-05 ENCOUNTER — Other Ambulatory Visit: Payer: Medicare Other

## 2017-08-05 DIAGNOSIS — D689 Coagulation defect, unspecified: Secondary | ICD-10-CM

## 2017-08-06 LAB — PROTIME-INR
INR: 2.2 — AB (ref 0.8–1.2)
PROTHROMBIN TIME: 22 s — AB (ref 9.1–12.0)

## 2017-08-08 ENCOUNTER — Encounter: Payer: Self-pay | Admitting: Family Medicine

## 2017-08-08 ENCOUNTER — Ambulatory Visit (INDEPENDENT_AMBULATORY_CARE_PROVIDER_SITE_OTHER): Payer: Medicare Other | Admitting: Family Medicine

## 2017-08-08 VITALS — BP 110/70 | HR 64 | Ht 64.0 in | Wt 167.0 lb

## 2017-08-08 DIAGNOSIS — S20212D Contusion of left front wall of thorax, subsequent encounter: Secondary | ICD-10-CM | POA: Diagnosis not present

## 2017-08-08 DIAGNOSIS — I482 Chronic atrial fibrillation, unspecified: Secondary | ICD-10-CM

## 2017-08-08 MED ORDER — TRAMADOL-ACETAMINOPHEN 37.5-325 MG PO TABS: 1.0000 | ORAL_TABLET | Freq: Four times a day (QID) | ORAL | 0 refills | Status: DC | PRN

## 2017-08-08 NOTE — Progress Notes (Signed)
Name: Laurie Fuentes   MRN: [id number]    DOB: 09-27-1926   Date:08/08/2017       Progress Note  Subjective  Chief Complaint  Chief Complaint  Patient presents with  . Follow-up    fall against tub- still having to take tramadol BID but getting better    Follow up fall related chest injury.  Continued pain but improving on current medical regimen.   Fall  The accident occurred more than 1 week ago (follow up). Fall occurred: standing in bathroom. Impact surface: bath tub. There was no blood loss. Point of impact: left chest. Pain location: left chest/xiphoid. The symptoms are aggravated by ambulation. Pertinent negatives include no abdominal pain, bowel incontinence, fever, headaches, hematuria, nausea, numbness or tingling. Treatments tried: tramadol. The treatment provided moderate relief.    No problem-specific Assessment & Plan notes found for this encounter.   Past Medical History:  Diagnosis Date  . GERD (gastroesophageal reflux disease)   . Hypertension   . Hypokalemia   . Stroke [facility])     Past Surgical History:  Procedure Laterality Date  . GALLBLADDER SURGERY    . HIP FRACTURE SURGERY Right     Family History  Problem Relation Age of Onset  . Diabetes Sister     Social History   Social History  . Marital status: Widowed    Spouse name: N/A  . Number of children: N/A  . Years of education: N/A   Occupational History  . Not on file.   Social History Main Topics  . Smoking status: Never Smoker  . Smokeless tobacco: Never Used  . Alcohol use No  . Drug use: No  . Sexual activity: No   Other Topics Concern  . Not on file   Social History Narrative  . No narrative on file    No Known Allergies  Outpatient Medications Prior to Visit  Medication Sig Dispense Refill  . amLODipine (NORVASC) 5 MG tablet Take 1 tablet (5 mg total) by mouth daily. 30 tablet 5  . hydrochlorothiazide (HYDRODIURIL) 25 MG tablet Take 1 tablet (25 mg total) by mouth daily.  30 tablet 5  . mupirocin ointment (BACTROBAN) 2 % Apply 1 application topically 2 (two) times daily. 22 g 0  . potassium chloride SA (K-DUR,KLOR-CON) 20 MEQ tablet Take 1 tablet (20 mEq total) by mouth 2 (two) times daily. 60 tablet 5  . ranitidine (ZANTAC) 300 MG tablet Take 1 tablet (300 mg total) by mouth at bedtime. 30 tablet 5  . warfarin (COUMADIN) 1 MG tablet Take 1 tablet (1 mg total) by mouth daily. Use as directed 30 tablet 1  . warfarin (COUMADIN) 2 MG tablet Take 1 tablet (2 mg total) by mouth daily. 30 tablet 5  . warfarin (COUMADIN) 3 MG tablet Take 1 tablet (3 mg total) by mouth daily. 30 tablet 5  . traMADol-acetaminophen (ULTRACET) 37.5-325 MG tablet Take 1 tablet by mouth 2 (two) times daily. 10 tablet 0   Facility-Administered Medications Prior to Visit  Medication Dose Route Frequency Provider Last Rate Last Dose  . carbamide peroxide (DEBROX) 6.5 % otic solution 5 drop  5 drop Left EAR Once Juline Patch, MD        Review of Systems  Constitutional: Negative for chills, fever, malaise/fatigue and weight loss.  HENT: Negative for ear discharge, ear pain and sore throat.   Eyes: Negative for blurred vision.  Respiratory: Negative for cough, sputum production, shortness of breath and wheezing.  Cardiovascular: Negative for chest pain, palpitations and leg swelling.  Gastrointestinal: Negative for abdominal pain, blood in stool, bowel incontinence, constipation, diarrhea, heartburn, melena and nausea.  Genitourinary: Negative for dysuria, frequency, hematuria and urgency.  Musculoskeletal: Negative for back pain, joint pain, myalgias and neck pain.  Skin: Negative for rash.  Neurological: Negative for dizziness, tingling, sensory change, focal weakness, numbness and headaches.  Endo/Heme/Allergies: Negative for environmental allergies and polydipsia. Does not bruise/bleed easily.  Psychiatric/Behavioral: Negative for depression and suicidal ideas. The patient is not  nervous/anxious and does not have insomnia.      Objective  Vitals:   08/08/17 0916  BP: 110/70  Pulse: 64  Weight: 167 lb (75.8 kg)  Height: 5\' 4"  (1.626 m)    Physical Exam  Constitutional: She is well-developed, well-nourished, and in no distress. No distress.  HENT:  Head: Normocephalic and atraumatic.  Right Ear: External ear normal.  Left Ear: External ear normal.  Nose: Nose normal.  Mouth/Throat: Oropharynx is clear and moist.  Eyes: Pupils are equal, round, and reactive to light. Conjunctivae and EOM are normal. Right eye exhibits no discharge. Left eye exhibits no discharge.  Neck: Normal range of motion. Neck supple. No JVD present. No thyromegaly present.  Cardiovascular: Normal rate, regular rhythm, normal heart sounds and intact distal pulses.  Exam reveals no gallop and no friction rub.   No murmur heard. Pulmonary/Chest: Effort normal and breath sounds normal. She has no wheezes. She has no rales.  Abdominal: Soft. Bowel sounds are normal. She exhibits no mass. There is no tenderness. There is no guarding.  Musculoskeletal: Normal range of motion. She exhibits no edema.  Lymphadenopathy:    She has no cervical adenopathy.  Neurological: She is alert.  Skin: Skin is warm and dry. She is not diaphoretic.  Psychiatric: Mood and affect normal.  Nursing note and vitals reviewed.     Assessment & Plan  Problem List Items Addressed This Visit      Cardiovascular and Mediastinum   Chronic atrial fibrillation (HCC)     Musculoskeletal and Integument   Rib contusion, left, subsequent encounter - Primary   Relevant Medications   traMADol-acetaminophen (ULTRACET) 37.5-325 MG tablet      Meds ordered this encounter  Medications  . traMADol-acetaminophen (ULTRACET) 37.5-325 MG tablet    Sig: Take 1 tablet by mouth every 6 (six) hours as needed.    Dispense:  20 tablet    Refill:  0      Dr. [facility]  08/08/17

## 2017-08-20 DIAGNOSIS — D0461 Carcinoma in situ of skin of right upper limb, including shoulder: Secondary | ICD-10-CM | POA: Diagnosis not present

## 2017-08-22 ENCOUNTER — Encounter: Payer: Self-pay | Admitting: Family Medicine

## 2017-08-22 ENCOUNTER — Ambulatory Visit (INDEPENDENT_AMBULATORY_CARE_PROVIDER_SITE_OTHER): Payer: Medicare Other | Admitting: Family Medicine

## 2017-08-22 VITALS — BP 104/70 | HR 80 | Ht 64.0 in | Wt 167.0 lb

## 2017-08-22 DIAGNOSIS — I952 Hypotension due to drugs: Secondary | ICD-10-CM | POA: Diagnosis not present

## 2017-08-22 DIAGNOSIS — W19XXXA Unspecified fall, initial encounter: Secondary | ICD-10-CM | POA: Diagnosis not present

## 2017-08-22 DIAGNOSIS — I10 Essential (primary) hypertension: Secondary | ICD-10-CM

## 2017-08-22 NOTE — Progress Notes (Signed)
Name: Laurie Fuentes   MRN: [id number]    DOB: 11-26-1925   Date:08/22/2017       Progress Note  Subjective  Chief Complaint  Chief Complaint  Patient presents with  . Follow-up    fall- fell again x 3 days ago in the early morning. Says she "missed the chair when trying to sit down." Hit her L) arm, fell on bottom. Took last Tramadol yesterday    Fall  The accident occurred 3 to 5 days ago. Fall occurred: while sitting to chair. She fell from a height of 1 to 2 ft. She landed on carpet. There was no blood loss. The point of impact was the buttocks. The pain is present in the right elbow and left elbow. The pain is at a severity of 3/10. Pertinent negatives include no abdominal pain, bowel incontinence, fever, headaches, hearing loss, hematuria, loss of consciousness, nausea, numbness, tingling or visual change. She has tried nothing for the symptoms.    No problem-specific Assessment & Plan notes found for this encounter.   Past Medical History:  Diagnosis Date  . GERD (gastroesophageal reflux disease)   . Hypertension   . Hypokalemia   . Stroke [facility])     Past Surgical History:  Procedure Laterality Date  . GALLBLADDER SURGERY    . HIP FRACTURE SURGERY Right     Family History  Problem Relation Age of Onset  . Diabetes Sister     Social History   Social History  . Marital status: Widowed    Spouse name: N/A  . Number of children: N/A  . Years of education: N/A   Occupational History  . Not on file.   Social History Main Topics  . Smoking status: Never Smoker  . Smokeless tobacco: Never Used  . Alcohol use No  . Drug use: No  . Sexual activity: No   Other Topics Concern  . Not on file   Social History Narrative  . No narrative on file    No Known Allergies  Outpatient Medications Prior to Visit  Medication Sig Dispense Refill  . hydrochlorothiazide (HYDRODIURIL) 25 MG tablet Take 1 tablet (25 mg total) by mouth daily. 30 tablet 5  . mupirocin  ointment (BACTROBAN) 2 % Apply 1 application topically 2 (two) times daily. 22 g 0  . potassium chloride SA (K-DUR,KLOR-CON) 20 MEQ tablet Take 1 tablet (20 mEq total) by mouth 2 (two) times daily. 60 tablet 5  . ranitidine (ZANTAC) 300 MG tablet Take 1 tablet (300 mg total) by mouth at bedtime. 30 tablet 5  . warfarin (COUMADIN) 1 MG tablet Take 1 tablet (1 mg total) by mouth daily. Use as directed 30 tablet 1  . warfarin (COUMADIN) 2 MG tablet Take 1 tablet (2 mg total) by mouth daily. 30 tablet 5  . warfarin (COUMADIN) 3 MG tablet Take 1 tablet (3 mg total) by mouth daily. 30 tablet 5  . amLODipine (NORVASC) 5 MG tablet Take 1 tablet (5 mg total) by mouth daily. 30 tablet 5  . traMADol-acetaminophen (ULTRACET) 37.5-325 MG tablet Take 1 tablet by mouth every 6 (six) hours as needed. (Patient not taking: Reported on 08/22/2017) 20 tablet 0   Facility-Administered Medications Prior to Visit  Medication Dose Route Frequency Provider Last Rate Last Dose  . carbamide peroxide (DEBROX) 6.5 % otic solution 5 drop  5 drop Left EAR Once Juline Patch, MD        Review of Systems  Constitutional: Negative for chills,  fever, malaise/fatigue and weight loss.  HENT: Negative for ear discharge, ear pain and sore throat.   Eyes: Negative for blurred vision.  Respiratory: Negative for cough, sputum production, shortness of breath and wheezing.   Cardiovascular: Negative for chest pain, palpitations and leg swelling.  Gastrointestinal: Negative for abdominal pain, blood in stool, bowel incontinence, constipation, diarrhea, heartburn, melena and nausea.  Genitourinary: Negative for dysuria, frequency, hematuria and urgency.  Musculoskeletal: Negative for back pain, joint pain, myalgias and neck pain.  Skin: Negative for rash.  Neurological: Negative for dizziness, tingling, sensory change, focal weakness, loss of consciousness, numbness and headaches.  Endo/Heme/Allergies: Negative for environmental  allergies and polydipsia. Does not bruise/bleed easily.  Psychiatric/Behavioral: Negative for depression and suicidal ideas. The patient is not nervous/anxious and does not have insomnia.      Objective  Vitals:   08/22/17 0909  BP: 104/70  Pulse: 80  Weight: 167 lb (75.8 kg)  Height: 5\' 4"  (1.626 m)    Physical Exam  Constitutional: She is well-developed, well-nourished, and in no distress. No distress.  HENT:  Head: Normocephalic and atraumatic.  Right Ear: External ear normal.  Left Ear: External ear normal.  Nose: Nose normal.  Mouth/Throat: Oropharynx is clear and moist.  Eyes: Pupils are equal, round, and reactive to light. Conjunctivae and EOM are normal. Right eye exhibits no discharge. Left eye exhibits no discharge.  Neck: Normal range of motion. Neck supple. No JVD present. No thyromegaly present.  Cardiovascular: Normal rate, regular rhythm, S1 normal, S2 normal and intact distal pulses.  Exam reveals no gallop, no S3, no S4 and no friction rub.   Murmur heard.  Systolic murmur is present with a grade of 1/6  Pulmonary/Chest: Effort normal and breath sounds normal. She has no wheezes. She has no rales.  Abdominal: Soft. Bowel sounds are normal. She exhibits no mass. There is no tenderness. There is no guarding.  Musculoskeletal: Normal range of motion. She exhibits no edema.  Lymphadenopathy:    She has no cervical adenopathy.  Neurological: She is alert. She has normal reflexes.  Skin: Skin is warm and dry. She is not diaphoretic.  Psychiatric: Mood and affect normal.  Nursing note and vitals reviewed.     Assessment & Plan  Problem List Items Addressed This Visit      Cardiovascular and Mediastinum   Essential hypertension    Other Visit Diagnoses    Fall, initial encounter    -  Primary   Hypotension due to drugs          No orders of the defined types were placed in this encounter.     Dr. [facility]  08/22/17

## 2017-09-02 ENCOUNTER — Other Ambulatory Visit: Payer: Medicare Other

## 2017-09-02 DIAGNOSIS — I482 Chronic atrial fibrillation, unspecified: Secondary | ICD-10-CM

## 2017-09-03 LAB — PROTIME-INR
INR: 2.4 — ABNORMAL HIGH (ref 0.8–1.2)
PROTHROMBIN TIME: 23.5 s — AB (ref 9.1–12.0)

## 2017-09-30 ENCOUNTER — Other Ambulatory Visit: Payer: Medicare Other

## 2017-09-30 DIAGNOSIS — I482 Chronic atrial fibrillation, unspecified: Secondary | ICD-10-CM

## 2017-10-01 LAB — PROTIME-INR
INR: 3.3 — AB (ref 0.8–1.2)
PROTHROMBIN TIME: 31.9 s — AB (ref 9.1–12.0)

## 2017-10-18 ENCOUNTER — Other Ambulatory Visit: Payer: Self-pay

## 2017-10-18 ENCOUNTER — Encounter: Payer: Self-pay | Admitting: Emergency Medicine

## 2017-10-18 ENCOUNTER — Emergency Department
Admission: EM | Admit: 2017-10-18 | Discharge: 2017-10-18 | Disposition: A | Discharge status: Home / Self Care | Payer: Medicare Other | Attending: Emergency Medicine | Admitting: Emergency Medicine

## 2017-10-18 ENCOUNTER — Emergency Department: Payer: Medicare Other

## 2017-10-18 DIAGNOSIS — Z8673 Personal history of transient ischemic attack (TIA), and cerebral infarction without residual deficits: Secondary | ICD-10-CM | POA: Diagnosis not present

## 2017-10-18 DIAGNOSIS — I10 Essential (primary) hypertension: Secondary | ICD-10-CM | POA: Insufficient documentation

## 2017-10-18 DIAGNOSIS — Z7901 Long term (current) use of anticoagulants: Secondary | ICD-10-CM | POA: Insufficient documentation

## 2017-10-18 DIAGNOSIS — M25551 Pain in right hip: Secondary | ICD-10-CM | POA: Diagnosis not present

## 2017-10-18 DIAGNOSIS — Z79899 Other long term (current) drug therapy: Secondary | ICD-10-CM | POA: Diagnosis not present

## 2017-10-18 DIAGNOSIS — S20212D Contusion of left front wall of thorax, subsequent encounter: Secondary | ICD-10-CM

## 2017-10-18 MED ORDER — TRAMADOL-ACETAMINOPHEN 37.5-325 MG PO TABS: 1.0000 | ORAL_TABLET | Freq: Two times a day (BID) | ORAL | 0 refills | Status: DC

## 2017-10-18 NOTE — ED Notes (Signed)
Patient transported to XR. 

## 2017-10-18 NOTE — Discharge Instructions (Signed)
Continued to ambulate with walker.

## 2017-10-18 NOTE — ED Triage Notes (Signed)
Pt here with daughter, pt reports right hip pain for the past week, pain only when walking. Denies any injury.  Pain is more towards the buttocks.  Pt ambulatory in lobby, placed into wheelchair.  Pt denies any pain when sitting in chair.

## 2017-10-18 NOTE — ED Provider Notes (Signed)
[facility] Emergency Department Provider Note   ____________________________________________   First MD Initiated Contact with Patient 10/18/17 1402     (approximate)  I have reviewed the triage vital signs and the nursing notes.   HISTORY  Chief Complaint Hip Pain    HPI Laurie Fuentes is a 83 y.o. female patient complaining of 1-1/2 weeks of increasing right hip pain without history of trauma. Patient stated pain increases with ambulation or prolonged standing. Patient stated pain is more towards the posterior aspect of the right hip. Patient denies any pain when nonweightbearing. Unable to fully qualify nature of her pain. No palliative measures for complaint. There is a history of prior right hip fracture 2013. Past Medical History:  Diagnosis Date  . GERD (gastroesophageal reflux disease)   . Hypertension   . Hypokalemia   . Stroke [facility])     Patient Active Problem List   Diagnosis Date Noted  . Rib contusion, left, subsequent encounter 08/08/2017  . Chronic atrial fibrillation ([place]) 05/28/2016  . Hypokalemia 05/28/2016  . Anticoagulant long-term use 04/02/2016  . Essential hypertension 11/29/2015  . Cerebral vascular disease 11/29/2015  . GERD (gastroesophageal reflux disease) 06/14/2015  . Deep vein thrombosis of lower extremity ([place]) 06/24/2014    Past Surgical History:  Procedure Laterality Date  . GALLBLADDER SURGERY    . HIP FRACTURE SURGERY Right     Prior to Admission medications   Medication Sig Start Date End Date Taking? Authorizing Provider  hydrochlorothiazide (HYDRODIURIL) 25 MG tablet Take 1 tablet (25 mg total) by mouth daily. 05/13/17   Juline Patch, MD  mupirocin ointment (BACTROBAN) 2 % Apply 1 application topically 2 (two) times daily. 05/13/17   Juline Patch, MD  potassium chloride SA (K-DUR,KLOR-CON) 20 MEQ tablet Take 1 tablet (20 mEq total) by mouth 2 (two) times daily. 05/13/17   Juline Patch, MD    ranitidine (ZANTAC) 300 MG tablet Take 1 tablet (300 mg total) by mouth at bedtime. 05/13/17   Juline Patch, MD  traMADol-acetaminophen (ULTRACET) 37.5-325 MG tablet Take 1 tablet by mouth 2 (two) times daily. As needed for pain 10/18/17   Sable Feil, PA-C  warfarin (COUMADIN) 1 MG tablet Take 1 tablet (1 mg total) by mouth daily. Use as directed 02/07/16   Juline Patch, MD  warfarin (COUMADIN) 2 MG tablet Take 1 tablet (2 mg total) by mouth daily. 10/30/16   Juline Patch, MD  warfarin (COUMADIN) 3 MG tablet Take 1 tablet (3 mg total) by mouth daily. 01/09/17   Juline Patch, MD    Allergies Patient has no known allergies.  Family History  Problem Relation Age of Onset  . Diabetes Sister     Social History Social History   Tobacco Use  . Smoking status: Never Smoker  . Smokeless tobacco: Never Used  Substance Use Topics  . Alcohol use: No    Alcohol/week: 0.0 oz  . Drug use: No    Review of Systems Constitutional: No fever/chills Eyes: No visual changes. ENT: No sore throat. Cardiovascular: Denies chest pain. Respiratory: Denies shortness of breath. Gastrointestinal: No abdominal pain.  No nausea, no vomiting.  No diarrhea.  No constipation. Genitourinary: Negative for dysuria. Musculoskeletal: Right posterior hip pain  Skin: Negative for rash. Neurological: Negative for headaches, focal weakness or numbness. ____________________________________________   PHYSICAL EXAM:  VITAL SIGNS: ED Triage Vitals  Enc Vitals Group     BP 10/18/17 1329 (!) 133/56  Pulse Rate 10/18/17 1328 72     Resp 10/18/17 1328 20     Temp 10/18/17 1328 98 F (36.7 C)     Temp Source 10/18/17 1328 Oral     SpO2 10/18/17 1328 98 %     Weight 10/18/17 1329 166 lb (75.3 kg)     Height 10/18/17 1329 5\' 4"  (1.626 m)     Head Circumference --      Peak Flow --      Pain Score --      Pain Loc --      Pain Edu? --      Excl. in [place]? --    Constitutional: Alert and oriented.  Well appearing and in no acute distress. Cardiovascular: Normal rate, regular rhythm. Grossly normal heart sounds.  Good peripheral circulation. Respiratory: Normal respiratory effort.  No retractions. Lungs CTAB. Musculoskeletal: No obvious deformity. No leg length discrepancy. No lower extremity tenderness nor edema.  No joint effusions. Able to bear weight. Neurologic:  Normal speech and language. No gross focal neurologic deficits are appreciated. No gait instability. Skin:  Skin is warm, dry and intact. No rash noted. Psychiatric: Mood and affect are normal. Speech and behavior are normal.  ____________________________________________   LABS (all labs ordered are listed, but only abnormal results are displayed)  Labs Reviewed - No data to display ____________________________________________  EKG   ____________________________________________  RADIOLOGY  Dg Hip Unilat W Or Wo Pelvis 2-3 Views Right  Result Date: 10/18/2017 CLINICAL DATA:  Right hip pain EXAM: DG HIP (WITH OR WITHOUT PELVIS) 2-3V RIGHT COMPARISON:  05/11/2015 FINDINGS: Changes of right hip replacement. Early degenerative changes in the left hip with spurring. No acute bony abnormality. Specifically, no fracture, subluxation, or dislocation. Soft tissues are intact. IMPRESSION: Right hip replacement.  No acute bony abnormality. Electronically Signed   By: Rolm Baptise M.D.   On: 10/18/2017 14:52    ____________________________________________   PROCEDURES  Procedure(s) performed: None  Procedures  Critical Care performed: No  ____________________________________________   INITIAL IMPRESSION / ASSESSMENT AND PLAN / ED COURSE  As part of my medical decision making, I reviewed the following data within the electronic MEDICAL RECORD NUMBER    Right hip pain. Discussed x-ray finding with patient and daughter. Advised to follow-up with PCP if complaint persists. Take pain medication as needed.       ____________________________________________   FINAL CLINICAL IMPRESSION(S) / ED DIAGNOSES  Final diagnoses:  Right hip pain     ED Discharge Orders        Ordered    traMADol-acetaminophen (ULTRACET) 37.5-325 MG tablet  2 times daily     10/18/17 1459       Note:  This document was prepared using Dragon voice recognition software and may include unintentional dictation errors.    Sable Feil, PA-C 10/18/17 1502    Earleen Newport, MD 10/18/17 [phone]

## 2017-10-21 ENCOUNTER — Other Ambulatory Visit: Payer: Medicare Other

## 2017-10-21 DIAGNOSIS — Z7901 Long term (current) use of anticoagulants: Secondary | ICD-10-CM

## 2017-10-22 LAB — PROTIME-INR
INR: 2.1 — AB (ref 0.8–1.2)
PROTHROMBIN TIME: 20.8 s — AB (ref 9.1–12.0)

## 2017-10-29 ENCOUNTER — Other Ambulatory Visit: Payer: Self-pay | Admitting: Family Medicine

## 2017-10-29 DIAGNOSIS — I82409 Acute embolism and thrombosis of unspecified deep veins of unspecified lower extremity: Secondary | ICD-10-CM

## 2017-11-03 ENCOUNTER — Other Ambulatory Visit: Payer: Self-pay

## 2017-11-03 ENCOUNTER — Encounter: Payer: Self-pay | Admitting: Family Medicine

## 2017-11-03 ENCOUNTER — Ambulatory Visit (INDEPENDENT_AMBULATORY_CARE_PROVIDER_SITE_OTHER): Payer: Medicare Other | Admitting: Family Medicine

## 2017-11-03 VITALS — BP 116/76 | HR 68 | Resp 16 | Ht 64.0 in | Wt 166.4 lb

## 2017-11-03 DIAGNOSIS — E876 Hypokalemia: Secondary | ICD-10-CM | POA: Diagnosis not present

## 2017-11-03 DIAGNOSIS — I10 Essential (primary) hypertension: Secondary | ICD-10-CM | POA: Diagnosis not present

## 2017-11-03 DIAGNOSIS — K219 Gastro-esophageal reflux disease without esophagitis: Secondary | ICD-10-CM | POA: Diagnosis not present

## 2017-11-03 DIAGNOSIS — Z23 Encounter for immunization: Secondary | ICD-10-CM | POA: Diagnosis not present

## 2017-11-03 MED ORDER — RANITIDINE HCL 300 MG PO TABS: 300.0000 mg | ORAL_TABLET | Freq: Every day | ORAL | 5 refills | Status: DC

## 2017-11-03 MED ORDER — HYDROCHLOROTHIAZIDE 25 MG PO TABS: 25.0000 mg | ORAL_TABLET | Freq: Every day | ORAL | 5 refills | Status: DC

## 2017-11-03 MED ORDER — POTASSIUM CHLORIDE CRYS ER 20 MEQ PO TBCR: 20.0000 meq | EXTENDED_RELEASE_TABLET | Freq: Every day | ORAL | 5 refills | Status: DC

## 2017-11-03 MED ORDER — AMLODIPINE BESYLATE 5 MG PO TABS: 5.0000 mg | ORAL_TABLET | Freq: Every day | ORAL | 5 refills | Status: DC

## 2017-11-03 NOTE — Progress Notes (Signed)
Name: Laurie Fuentes   MRN: [id number]    DOB: [birth year]   Date:11/03/2017       Progress Note  Subjective  Chief Complaint  Chief Complaint  Patient presents with  . Hypertension  . Coagulation Disorder    Hypertension  This is a chronic problem. The current episode started more than 1 year ago. The problem has been gradually improving since onset. The problem is controlled. Pertinent negatives include no anxiety, blurred vision, chest pain, headaches, malaise/fatigue, neck pain, orthopnea, palpitations, peripheral edema, PND, shortness of breath or sweats. There are no associated agents to hypertension. Past treatments include calcium channel blockers and diuretics. The current treatment provides moderate improvement. There are no compliance problems.  There is no history of angina, kidney disease, CAD/MI, CVA, heart failure, left ventricular hypertrophy, PVD or retinopathy. There is no history of chronic renal disease, a hypertension causing med or renovascular disease.  Gastroesophageal Reflux  She reports no abdominal pain, no belching, no chest pain, no choking, no coughing, no dysphagia, no early satiety, no heartburn, no hoarse voice, no nausea, no sore throat, no stridor, no water brash or no wheezing. This is a recurrent problem. The problem occurs occasionally. The problem has been waxing and waning. The symptoms are aggravated by certain foods. Pertinent negatives include no anemia, fatigue, melena, muscle weakness, orthopnea or weight loss. She has tried a histamine-2 antagonist for the symptoms. The treatment provided moderate relief.    No problem-specific Assessment & Plan notes found for this encounter.   Past Medical History:  Diagnosis Date  . GERD (gastroesophageal reflux disease)   . Hypertension   . Hypokalemia   . Stroke [facility])     Past Surgical History:  Procedure Laterality Date  . GALLBLADDER SURGERY    . HIP FRACTURE SURGERY Right     Family History   Problem Relation Age of Onset  . Diabetes Sister     Social History   Socioeconomic History  . Marital status: Widowed    Spouse name: Not on file  . Number of children: Not on file  . Years of education: Not on file  . Highest education level: Not on file  Social Needs  . Financial resource strain: Not on file  . Food insecurity - worry: Not on file  . Food insecurity - inability: Not on file  . Transportation needs - medical: Not on file  . Transportation needs - non-medical: Not on file  Occupational History  . Not on file  Tobacco Use  . Smoking status: Never Smoker  . Smokeless tobacco: Never Used  Substance and Sexual Activity  . Alcohol use: No    Alcohol/week: 0.0 oz  . Drug use: No  . Sexual activity: No  Other Topics Concern  . Not on file  Social History Narrative  . Not on file    No Known Allergies  Outpatient Medications Prior to Visit  Medication Sig Dispense Refill  . mupirocin ointment (BACTROBAN) 2 % Apply 1 application topically 2 (two) times daily. 22 g 0  . warfarin (COUMADIN) 2 MG tablet TAKE ONE TABLET BY MOUTH ONCE DAILY 30 tablet 5  . amLODipine (NORVASC) 5 MG tablet Take by mouth.    . hydrochlorothiazide (HYDRODIURIL) 25 MG tablet Take 1 tablet (25 mg total) by mouth daily. 30 tablet 5  . potassium chloride SA (K-DUR,KLOR-CON) 20 MEQ tablet Take by mouth.    . ranitidine (ZANTAC) 300 MG tablet Take 1 tablet (300 mg total) by  mouth at bedtime. 30 tablet 5  . traMADol-acetaminophen (ULTRACET) 37.5-325 MG tablet Take 1 tablet by mouth 2 (two) times daily. As needed for pain 20 tablet 0  . warfarin (COUMADIN) 1 MG tablet Take 1 tablet (1 mg total) by mouth daily. Use as directed 30 tablet 1  . warfarin (COUMADIN) 2 MG tablet Take 1 tablet (2 mg total) by mouth daily. 30 tablet 5  . warfarin (COUMADIN) 3 MG tablet Take 1 tablet (3 mg total) by mouth daily. 30 tablet 5  . warfarin (COUMADIN) 3 MG tablet Take by mouth.     Facility-Administered  Medications Prior to Visit  Medication Dose Route Frequency Provider Last Rate Last Dose  . carbamide peroxide (DEBROX) 6.5 % otic solution 5 drop  5 drop Left EAR Once Juline Patch, MD        Review of Systems  Constitutional: Negative for chills, fatigue, fever, malaise/fatigue and weight loss.  HENT: Negative for ear discharge, ear pain, hoarse voice and sore throat.   Eyes: Negative for blurred vision.  Respiratory: Negative for cough, sputum production, choking, shortness of breath and wheezing.   Cardiovascular: Negative for chest pain, palpitations, orthopnea, leg swelling and PND.  Gastrointestinal: Negative for abdominal pain, blood in stool, constipation, diarrhea, dysphagia, heartburn, melena and nausea.  Genitourinary: Negative for dysuria, frequency, hematuria and urgency.  Musculoskeletal: Negative for back pain, joint pain, myalgias, muscle weakness and neck pain.  Skin: Negative for rash.  Neurological: Negative for dizziness, tingling, sensory change, focal weakness and headaches.  Endo/Heme/Allergies: Negative for environmental allergies and polydipsia. Does not bruise/bleed easily.  Psychiatric/Behavioral: Negative for depression and suicidal ideas. The patient is not nervous/anxious and does not have insomnia.      Objective  Vitals:   11/03/17 1031  BP: 116/76  Pulse: 68  Resp: 16  SpO2: 99%  Weight: 166 lb 6.4 oz (75.5 kg)  Height: 5\' 4"  (1.626 m)    Physical Exam  Constitutional: She is well-developed, well-nourished, and in no distress. No distress.  HENT:  Head: Normocephalic and atraumatic.  Right Ear: Tympanic membrane, external ear and ear canal normal. Decreased hearing is noted.  Left Ear: Tympanic membrane, external ear and ear canal normal. Decreased hearing is noted.  Nose: Nose normal.  Mouth/Throat: Oropharynx is clear and moist.  Eyes: Conjunctivae and EOM are normal. Pupils are equal, round, and reactive to light. Right eye exhibits no  discharge. Left eye exhibits no discharge.  Neck: Normal range of motion. Neck supple. No JVD present. No thyromegaly present.  Cardiovascular: Normal rate, regular rhythm, normal heart sounds and intact distal pulses. Exam reveals no gallop and no friction rub.  No murmur heard. Pulmonary/Chest: Effort normal and breath sounds normal. She has no wheezes. She has no rales.  Abdominal: Soft. Bowel sounds are normal. She exhibits no mass. There is no tenderness. There is no guarding.  Musculoskeletal: Normal range of motion. She exhibits no edema.  Lymphadenopathy:    She has no cervical adenopathy.  Neurological: She is alert.  Skin: Skin is warm and dry. She is not diaphoretic.  Psychiatric: Mood and affect normal.  Nursing note and vitals reviewed.     Assessment & Plan  Problem List Items Addressed This Visit      Cardiovascular and Mediastinum   Essential hypertension - Primary   Relevant Medications   hydrochlorothiazide (HYDRODIURIL) 25 MG tablet   amLODipine (NORVASC) 5 MG tablet   Other Relevant Orders   Renal Function Panel  Digestive   GERD (gastroesophageal reflux disease)   Relevant Medications   ranitidine (ZANTAC) 300 MG tablet     Other   Hypokalemia   Relevant Orders   Renal Function Panel    Other Visit Diagnoses    Need for 23-polyvalent pneumococcal polysaccharide vaccine          Meds ordered this encounter  Medications  . ranitidine (ZANTAC) 300 MG tablet    Sig: Take 1 tablet (300 mg total) by mouth at bedtime.    Dispense:  30 tablet    Refill:  5  . hydrochlorothiazide (HYDRODIURIL) 25 MG tablet    Sig: Take 1 tablet (25 mg total) by mouth daily.    Dispense:  30 tablet    Refill:  5  . potassium chloride SA (K-DUR,KLOR-CON) 20 MEQ tablet    Sig: Take 1 tablet (20 mEq total) by mouth daily.    Dispense:  30 tablet    Refill:  5  . amLODipine (NORVASC) 5 MG tablet    Sig: Take 1 tablet (5 mg total) by mouth daily.    Dispense:  30  tablet    Refill:  5      Dr. Otilio Miu [facility]  11/03/17

## 2017-11-04 LAB — RENAL FUNCTION PANEL
Albumin: 4.3 g/dL (ref 3.5–4.7)
BUN/Creatinine Ratio: 18 (ref 12–28)
BUN: 14 mg/dL (ref 8–27)
CALCIUM: 9.4 mg/dL (ref 8.7–10.3)
CO2: 36 mmol/L — ABNORMAL HIGH (ref 20–29)
Chloride: 89 mmol/L — ABNORMAL LOW (ref 96–106)
Creatinine, Ser: 0.76 mg/dL (ref 0.57–1.00)
GFR calc Af Amer: 80 mL/min/{1.73_m2} (ref >59)
GFR, EST NON AFRICAN AMERICAN: 70 mL/min/{1.73_m2} (ref >59)
GLUCOSE: 84 mg/dL (ref 65–99)
PHOSPHORUS: 3.1 mg/dL (ref 2.5–4.5)
POTASSIUM: 4.6 mmol/L (ref 3.5–5.2)
Sodium: 134 mmol/L (ref 134–144)

## 2017-11-17 ENCOUNTER — Encounter: Payer: Self-pay | Admitting: Emergency Medicine

## 2017-11-17 ENCOUNTER — Other Ambulatory Visit: Payer: Self-pay

## 2017-11-17 ENCOUNTER — Emergency Department
Admission: EM | Admit: 2017-11-17 | Discharge: 2017-11-17 | Disposition: A | Discharge status: Home / Self Care | Payer: Medicare Other | Attending: Student in an Organized Health Care Education/Training Program | Admitting: Student in an Organized Health Care Education/Training Program

## 2017-11-17 ENCOUNTER — Emergency Department: Payer: Medicare Other

## 2017-11-17 DIAGNOSIS — E871 Hypo-osmolality and hyponatremia: Secondary | ICD-10-CM | POA: Diagnosis not present

## 2017-11-17 DIAGNOSIS — Z8673 Personal history of transient ischemic attack (TIA), and cerebral infarction without residual deficits: Secondary | ICD-10-CM | POA: Diagnosis not present

## 2017-11-17 DIAGNOSIS — R0789 Other chest pain: Secondary | ICD-10-CM | POA: Diagnosis not present

## 2017-11-17 DIAGNOSIS — Z7901 Long term (current) use of anticoagulants: Secondary | ICD-10-CM | POA: Diagnosis not present

## 2017-11-17 DIAGNOSIS — I10 Essential (primary) hypertension: Secondary | ICD-10-CM | POA: Insufficient documentation

## 2017-11-17 DIAGNOSIS — R079 Chest pain, unspecified: Secondary | ICD-10-CM | POA: Insufficient documentation

## 2017-11-17 DIAGNOSIS — Z79899 Other long term (current) drug therapy: Secondary | ICD-10-CM | POA: Diagnosis not present

## 2017-11-17 LAB — BASIC METABOLIC PANEL
ANION GAP: 11 (ref 5–15)
BUN: 14 mg/dL (ref 6–20)
CO2: 27 mmol/L (ref 22–32)
Calcium: 9.3 mg/dL (ref 8.9–10.3)
Chloride: 88 mmol/L — ABNORMAL LOW (ref 101–111)
Creatinine, Ser: 0.74 mg/dL (ref 0.44–1.00)
GFR calc Af Amer: >60 mL/min (ref >60)
Glucose, Bld: 115 mg/dL — ABNORMAL HIGH (ref 65–99)
POTASSIUM: 3.7 mmol/L (ref 3.5–5.1)
Sodium: 126 mmol/L — ABNORMAL LOW (ref 135–145)

## 2017-11-17 LAB — CBC
HEMATOCRIT: 39.6 % (ref 35.0–47.0)
HEMOGLOBIN: 14 g/dL (ref 12.0–16.0)
MCH: 31.3 pg (ref 26.0–34.0)
MCHC: 35.3 g/dL (ref 32.0–36.0)
MCV: 88.7 fL (ref 80.0–100.0)
Platelets: 240 10*3/uL (ref 150–440)
RBC: 4.46 MIL/uL (ref 3.80–5.20)
RDW: 14.6 % — AB (ref 11.5–14.5)
WBC: 4.9 10*3/uL (ref 3.6–11.0)

## 2017-11-17 LAB — TROPONIN I: Troponin I: <0.03 ng/mL (ref <0.03)

## 2017-11-17 MED ORDER — SODIUM CHLORIDE 0.9 % IV BOLUS (SEPSIS): 1000.0000 mL | Freq: Once | INTRAVENOUS | Status: DC

## 2017-11-17 MED ORDER — SODIUM CHLORIDE 1 G PO TABS
1.0000 g | ORAL_TABLET | Freq: Once | ORAL | Status: DC
  Filled 2017-11-17: qty 1

## 2017-11-17 NOTE — Discharge Instructions (Signed)
Please follow-up with Dr. Ronnald Ramp office for repeat blood work to make sure that your sodium level is improving.  I would recommend that you stop taking her HCTZ ( Hydrochlorothiazide) medication as this is the most likely cause of your sodium getting lower.  Follow-up with cardiology.  Please return to the ER immediately should you have any worsening symptoms or for any additional questions or concerns.

## 2017-11-17 NOTE — ED Notes (Signed)
Pt presents today with pain in the Right Breast that started last night. Pt Is A&o x4 but is HOH. Pt has family at bedside. Pt is NAD at this time. VS stable awaiting EDP.

## 2017-11-17 NOTE — ED Provider Notes (Signed)
[facility] Emergency Department Provider Note    First MD Initiated Contact with Patient 11/17/17 1138     (approximate)  I have reviewed the triage vital signs and the nursing notes.   HISTORY   Chief Complaint Chest Pain    HPI Laurie Fuentes is a 84 y.o. female with a history of stroke on Coumadin no history of cardiac disease presents from home with chief complaint of right sided chest wall pain that started last night.  States the pain is short in duration lasting only a few seconds.  No radiation.  No shortness of breath.  No cough.  States it does hurt when she presses that her left breast.  Denies any nausea or vomiting.  No back pain.  Denies any pain at this time.  Was seen by her PCP this morning and sent to the ER for further evaluation.  Denies any diaphoresis has never had pain like this before.  She did take Tylenol arthritis pill this morning with improvement in her pain.  Past Medical History:  Diagnosis Date  . GERD (gastroesophageal reflux disease)   . Hypertension   . Hypokalemia   . Stroke [facility])    Family History  Problem Relation Age of Onset  . Diabetes Sister    Past Surgical History:  Procedure Laterality Date  . GALLBLADDER SURGERY    . HIP FRACTURE SURGERY Right    Patient Active Problem List   Diagnosis Date Noted  . Rib contusion, left, subsequent encounter 08/08/2017  . Chronic atrial fibrillation ([place]) 05/28/2016  . Hypokalemia 05/28/2016  . Anticoagulant long-term use 04/02/2016  . Hip fracture requiring operative repair ([place]) 01/07/2016  . Essential hypertension 11/29/2015  . Cerebral vascular disease 11/29/2015  . GERD (gastroesophageal reflux disease) 06/14/2015  . DVT of lower extremity (deep venous thrombosis) ([place]) 06/24/2014      Prior to Admission medications   Medication Sig Start Date End Date Taking? Authorizing Provider  amLODipine (NORVASC) 5 MG tablet Take 1 tablet (5 mg total) by mouth  daily. 11/03/17   Juline Patch, MD  hydrochlorothiazide (HYDRODIURIL) 25 MG tablet Take 1 tablet (25 mg total) by mouth daily. 11/03/17   Juline Patch, MD  mupirocin ointment (BACTROBAN) 2 % Apply 1 application topically 2 (two) times daily. Patient not taking: Reported on 11/17/2017 05/13/17   Juline Patch, MD  potassium chloride SA (K-DUR,KLOR-CON) 20 MEQ tablet Take 1 tablet (20 mEq total) by mouth daily. 11/03/17   Juline Patch, MD  ranitidine (ZANTAC) 300 MG tablet Take 1 tablet (300 mg total) by mouth at bedtime. 11/03/17   Juline Patch, MD  warfarin (COUMADIN) 2 MG tablet TAKE ONE TABLET BY MOUTH ONCE DAILY 10/29/17   Juline Patch, MD    Allergies Patient has no known allergies.    Social History Social History   Tobacco Use  . Smoking status: Never Smoker  . Smokeless tobacco: Never Used  Substance Use Topics  . Alcohol use: No    Alcohol/week: 0.0 oz  . Drug use: No    Review of Systems Patient denies headaches, rhinorrhea, blurry vision, numbness, shortness of breath, chest pain, edema, cough, abdominal pain, nausea, vomiting, diarrhea, dysuria, fevers, rashes or hallucinations unless otherwise stated above in HPI. ____________________________________________   PHYSICAL EXAM:  VITAL SIGNS: Vitals:   11/17/17 1201 11/17/17 1313  BP:  (!) 168/77  Pulse:  78  Resp:  19  Temp:    SpO2: 100%  100%    Constitutional: Alert and oriented. Well appearing and in no acute distress. Eyes: Conjunctivae are normal.  Head: Atraumatic. Nose: No congestion/rhinnorhea. Mouth/Throat: Mucous membranes are moist.   Neck: No stridor. Painless ROM.  Cardiovascular: Normal rate, regular rhythm. Grossly normal heart sounds.  Good peripheral circulation. Respiratory: Normal respiratory effort.  No retractions. Lungs CTAB. Gastrointestinal: Soft and nontender. No distention. No abdominal bruits. No CVA tenderness. Genitourinary:  Musculoskeletal: No lower extremity  tenderness nor edema.  No joint effusions. Neurologic:  Normal speech and language. No gross focal neurologic deficits are appreciated. No facial droop Skin:  Skin is warm, dry and intact. Area of abrasion to right hand, no cellulitis, Psychiatric: Mood and affect are normal. Speech and behavior are normal.  ____________________________________________   LABS (all labs ordered are listed, but only abnormal results are displayed)  Results for orders placed or performed during the hospital encounter of 11/17/17 (from the past 24 hour(s))  Basic metabolic panel     Status: Abnormal   Collection Time: 11/17/17 10:58 AM  Result Value Ref Range   Sodium 126 (L) 135 - 145 mmol/L   Potassium 3.7 3.5 - 5.1 mmol/L   Chloride 88 (L) 101 - 111 mmol/L   CO2 27 22 - 32 mmol/L   Glucose, Bld 115 (H) 65 - 99 mg/dL   BUN 14 6 - 20 mg/dL   Creatinine, Ser 0.74 0.44 - 1.00 mg/dL   Calcium 9.3 8.9 - 10.3 mg/dL   GFR calc non Af Amer >60 >60 mL/min   GFR calc Af Amer >60 >60 mL/min   Anion gap 11 5 - 15  CBC     Status: Abnormal   Collection Time: 11/17/17 10:58 AM  Result Value Ref Range   WBC 4.9 3.6 - 11.0 K/uL   RBC 4.46 3.80 - 5.20 MIL/uL   Hemoglobin 14.0 12.0 - 16.0 g/dL   HCT 39.6 35.0 - 47.0 %   MCV 88.7 80.0 - 100.0 fL   MCH 31.3 26.0 - 34.0 pg   MCHC 35.3 32.0 - 36.0 g/dL   RDW 14.6 (H) 11.5 - 14.5 %   Platelets 240 150 - 440 K/uL  Troponin I     Status: None   Collection Time: 11/17/17 10:58 AM  Result Value Ref Range   Troponin I <0.03 <0.03 ng/mL   ____________________________________________  EKG My review and personal interpretation at Time: 11:01   Indication: chest pain  Rate: 70  Rhythm: sinus Axis: normal  Other: normal intervals, no stemi, no significant depressions noted ____________________________________________  RADIOLOGY  I personally reviewed all radiographic images ordered to evaluate for the above acute complaints and reviewed radiology reports and  findings.  These findings were personally discussed with the patient.  Please see medical record for radiology report.  ____________________________________________   PROCEDURES  Procedure(s) performed:  Procedures    Critical Care performed: no ____________________________________________   INITIAL IMPRESSION / ASSESSMENT AND PLAN / ED COURSE  Pertinent labs & imaging results that were available during my care of the patient were reviewed by me and considered in my medical decision making (see chart for details).  DDX: ACS, pericarditis, esophagitis, boerhaaves, pe, dissection, pna, bronchitis, costochondritis   Dalilah AFRIKA BRICK is a 84 y.o. who presents to the ED with symptoms as described above.  EKG shows no acute ischemia.  She has no active chest pain at this time but does have some discomfort with palpation of the right anterior chest wall.  Will  check blood work to evaluate for ACS but seems less can clinically consistent with this pathology.  She is on Coumadin making PE unlikely.  Not clinically consistent with dissection.  Her abdominal exam is soft and benign.  The patient will be placed on continuous pulse oximetry and telemetry for monitoring.  Laboratory evaluation will be sent to evaluate for the above complaints.     Clinical Course as of Nov 18 1503  Mon Nov 17, 2017  1224 Patient's sodium noted to be 126 and patient had a CT Z.  Will give her oral sodium supplementation DC HCTZ and touch base with her PCP regarding close follow-up for repeat BMP.  She has no other signs or symptoms of hyponatremia.  Troponin is negative after over 24 hours of pain.  This does seem primarily musculoskeletal in nature.  She has no chest pain at this time.  [PR]    Clinical Course User Index [PR] Merlyn Lot, MD     ____________________________________________   FINAL CLINICAL IMPRESSION(S) / ED DIAGNOSES  Final diagnoses:  Hyponatremia  Chest pain, unspecified type       NEW MEDICATIONS STARTED DURING THIS VISIT:  Discharge Medication List as of 11/17/2017 12:26 PM       Note:  This document was prepared using Dragon voice recognition software and may include unintentional dictation errors.    Merlyn Lot, MD 11/17/17 1505

## 2017-11-17 NOTE — ED Triage Notes (Signed)
Arrives with daughter who reports right sided chest / breast pain x 1 day.  No SOB/ DOE.  Skin warm and dry.

## 2017-11-17 NOTE — ED Notes (Signed)
Patient transported to X-ray 

## 2017-11-18 ENCOUNTER — Ambulatory Visit (INDEPENDENT_AMBULATORY_CARE_PROVIDER_SITE_OTHER): Payer: Medicare Other

## 2017-11-18 ENCOUNTER — Ambulatory Visit: Payer: Medicare Other | Admitting: Family Medicine

## 2017-11-18 ENCOUNTER — Encounter: Payer: Self-pay | Admitting: Family Medicine

## 2017-11-18 ENCOUNTER — Ambulatory Visit (INDEPENDENT_AMBULATORY_CARE_PROVIDER_SITE_OTHER): Payer: Medicare Other | Admitting: Family Medicine

## 2017-11-18 DIAGNOSIS — H6123 Impacted cerumen, bilateral: Secondary | ICD-10-CM

## 2017-11-18 DIAGNOSIS — Z7901 Long term (current) use of anticoagulants: Secondary | ICD-10-CM | POA: Diagnosis not present

## 2017-11-18 DIAGNOSIS — I10 Essential (primary) hypertension: Secondary | ICD-10-CM

## 2017-11-18 NOTE — Progress Notes (Signed)
Name: Laurie Fuentes   MRN: [id number]    DOB: 1926/01/20   Date:11/18/2017       Progress Note  Subjective  Chief Complaint  No chief complaint on file.   Hypertension  This is a chronic problem. The current episode started more than 1 year ago. The problem has been waxing and waning since onset. The problem is controlled. Pertinent negatives include no blurred vision, chest pain, headaches, malaise/fatigue, neck pain, palpitations or shortness of breath. Risk factors for coronary artery disease include dyslipidemia. Past treatments include calcium channel blockers and diuretics. The current treatment provides moderate improvement. There are no compliance problems.  Hypertensive end-organ damage includes angina. There is no history of kidney disease, CAD/MI, CVA, heart failure, left ventricular hypertrophy, PVD or retinopathy. hosp eval see.    No problem-specific Assessment & Plan notes found for this encounter.   Past Medical History:  Diagnosis Date  . GERD (gastroesophageal reflux disease)   . Hypertension   . Hypokalemia   . Stroke [facility])     Past Surgical History:  Procedure Laterality Date  . GALLBLADDER SURGERY    . HIP FRACTURE SURGERY Right     Family History  Problem Relation Age of Onset  . Diabetes Sister     Social History   Socioeconomic History  . Marital status: Widowed    Spouse name: Not on file  . Number of children: Not on file  . Years of education: Not on file  . Highest education level: Not on file  Social Needs  . Financial resource strain: Not on file  . Food insecurity - worry: Not on file  . Food insecurity - inability: Not on file  . Transportation needs - medical: Not on file  . Transportation needs - non-medical: Not on file  Occupational History  . Not on file  Tobacco Use  . Smoking status: Never Smoker  . Smokeless tobacco: Never Used  Substance and Sexual Activity  . Alcohol use: No    Alcohol/week: 0.0 oz  . Drug use: No  .  Sexual activity: No  Other Topics Concern  . Not on file  Social History Narrative  . Not on file    No Known Allergies  Outpatient Medications Prior to Visit  Medication Sig Dispense Refill  . amLODipine (NORVASC) 5 MG tablet Take 1 tablet (5 mg total) by mouth daily. 30 tablet 5  . hydrochlorothiazide (HYDRODIURIL) 25 MG tablet Take 1 tablet (25 mg total) by mouth daily. 30 tablet 5  . mupirocin ointment (BACTROBAN) 2 % Apply 1 application topically 2 (two) times daily. (Patient not taking: Reported on 11/17/2017) 22 g 0  . potassium chloride SA (K-DUR,KLOR-CON) 20 MEQ tablet Take 1 tablet (20 mEq total) by mouth daily. 30 tablet 5  . ranitidine (ZANTAC) 300 MG tablet Take 1 tablet (300 mg total) by mouth at bedtime. 30 tablet 5  . warfarin (COUMADIN) 2 MG tablet TAKE ONE TABLET BY MOUTH ONCE DAILY 30 tablet 5   Facility-Administered Medications Prior to Visit  Medication Dose Route Frequency Provider Last Rate Last Dose  . carbamide peroxide (DEBROX) 6.5 % otic solution 5 drop  5 drop Left EAR Once Juline Patch, MD        Review of Systems  Constitutional: Negative for chills, fever, malaise/fatigue and weight loss.  HENT: Negative for ear discharge, ear pain and sore throat.   Eyes: Negative for blurred vision.  Respiratory: Negative for cough, sputum production, shortness of breath  and wheezing.   Cardiovascular: Negative for chest pain, palpitations and leg swelling.  Gastrointestinal: Negative for abdominal pain, blood in stool, constipation, diarrhea, heartburn, melena and nausea.  Genitourinary: Negative for dysuria, frequency, hematuria and urgency.  Musculoskeletal: Negative for back pain, joint pain, myalgias and neck pain.  Skin: Negative for rash.  Neurological: Negative for dizziness, tingling, sensory change, focal weakness and headaches.  Endo/Heme/Allergies: Negative for environmental allergies and polydipsia. Does not bruise/bleed easily.   Psychiatric/Behavioral: Negative for depression and suicidal ideas. The patient is not nervous/anxious and does not have insomnia.      Objective  There were no vitals filed for this visit.  Physical Exam  Constitutional: She is well-developed, well-nourished, and in no distress. No distress.  HENT:  Head: Normocephalic and atraumatic.  Right Ear: Tympanic membrane, external ear and ear canal normal.  Left Ear: Tympanic membrane, external ear and ear canal normal.  Nose: Nose normal.  Mouth/Throat: Oropharynx is clear and moist.  Eyes: Conjunctivae and EOM are normal. Pupils are equal, round, and reactive to light. Right eye exhibits no discharge. Left eye exhibits no discharge.  Neck: Normal range of motion. Neck supple. No JVD present. No thyromegaly present.  Cardiovascular: Normal rate, regular rhythm, S1 normal, S2 normal, normal heart sounds and intact distal pulses. Exam reveals no gallop, no S3, no S4 and no friction rub.  No murmur heard. Pulmonary/Chest: Effort normal. She has no wheezes. She has no rales.  Abdominal: Soft. Bowel sounds are normal. She exhibits no mass. There is no tenderness. There is no guarding.  Musculoskeletal: Normal range of motion. She exhibits no edema.  Lymphadenopathy:    She has no cervical adenopathy.  Neurological: She is alert.  Skin: Skin is warm and dry. She is not diaphoretic.  Psychiatric: Mood and affect normal.  Nursing note and vitals reviewed.     Assessment & Plan  Problem List Items Addressed This Visit      Cardiovascular and Mediastinum   Essential hypertension - Primary    Other Visit Diagnoses    Bilateral impacted cerumen          No orders of the defined types were placed in this encounter.     Dr. [facility]  11/18/17

## 2017-11-18 NOTE — Progress Notes (Signed)
Checked B/P due to stopping HCTZ- 124/80/ was not seen by doctor

## 2017-11-19 LAB — PROTIME-INR
INR: 2.1 — ABNORMAL HIGH (ref 0.8–1.2)
PROTHROMBIN TIME: 20.8 s — AB (ref 9.1–12.0)

## 2017-11-19 NOTE — Progress Notes (Signed)
This was changed to a visit by Dr Ronnald Ramp- needs to be closed

## 2017-11-21 DIAGNOSIS — D0461 Carcinoma in situ of skin of right upper limb, including shoulder: Secondary | ICD-10-CM | POA: Diagnosis not present

## 2017-11-25 ENCOUNTER — Encounter: Payer: Self-pay | Admitting: Family Medicine

## 2017-11-25 ENCOUNTER — Ambulatory Visit (INDEPENDENT_AMBULATORY_CARE_PROVIDER_SITE_OTHER): Payer: Medicare Other | Admitting: Family Medicine

## 2017-11-25 VITALS — BP 100/70 | HR 76 | Ht 64.0 in | Wt 166.0 lb

## 2017-11-25 DIAGNOSIS — R0789 Other chest pain: Secondary | ICD-10-CM | POA: Diagnosis not present

## 2017-11-25 DIAGNOSIS — E871 Hypo-osmolality and hyponatremia: Secondary | ICD-10-CM | POA: Diagnosis not present

## 2017-11-25 DIAGNOSIS — I10 Essential (primary) hypertension: Secondary | ICD-10-CM | POA: Diagnosis not present

## 2017-11-25 NOTE — Progress Notes (Signed)
Name: Laurie Fuentes   MRN: [id number]    DOB: 03/04/1926   Date:11/25/2017       Progress Note  Subjective  Chief Complaint  Chief Complaint  Patient presents with  . Follow-up    ER visit- 11/17/17- low sodium level    Follow up for evaluation er visit for chest pain.  Patient also noted to have hyponatremia. Thiazide medication stopped and needs recheck.   Chest Pain   This is a new problem. The current episode started in the past 7 days. The onset quality is gradual. The problem occurs intermittently. The problem has been gradually improving. The pain is present in the lateral region ("around my tittie). The pain is at a severity of 0/10 (currently resolved). The pain does not radiate. Pertinent negatives include no abdominal pain, back pain, cough, dizziness, fever, headaches, irregular heartbeat, malaise/fatigue, nausea, near-syncope, palpitations, shortness of breath or sputum production.    No problem-specific Assessment & Plan notes found for this encounter.   Past Medical History:  Diagnosis Date  . GERD (gastroesophageal reflux disease)   . Hypertension   . Hypokalemia   . Stroke [facility])     Past Surgical History:  Procedure Laterality Date  . GALLBLADDER SURGERY    . HIP FRACTURE SURGERY Right     Family History  Problem Relation Age of Onset  . Diabetes Sister     Social History   Socioeconomic History  . Marital status: Widowed    Spouse name: Not on file  . Number of children: Not on file  . Years of education: Not on file  . Highest education level: Not on file  Social Needs  . Financial resource strain: Not on file  . Food insecurity - worry: Not on file  . Food insecurity - inability: Not on file  . Transportation needs - medical: Not on file  . Transportation needs - non-medical: Not on file  Occupational History  . Not on file  Tobacco Use  . Smoking status: Never Smoker  . Smokeless tobacco: Never Used  Substance and Sexual Activity  .  Alcohol use: No    Alcohol/week: 0.0 oz  . Drug use: No  . Sexual activity: No  Other Topics Concern  . Not on file  Social History Narrative  . Not on file    No Known Allergies  Outpatient Medications Prior to Visit  Medication Sig Dispense Refill  . amLODipine (NORVASC) 5 MG tablet Take 1 tablet (5 mg total) by mouth daily. 30 tablet 5  . hydrochlorothiazide (HYDRODIURIL) 25 MG tablet Take 1 tablet (25 mg total) by mouth daily. 30 tablet 5  . potassium chloride SA (K-DUR,KLOR-CON) 20 MEQ tablet Take 1 tablet (20 mEq total) by mouth daily. 30 tablet 5  . ranitidine (ZANTAC) 300 MG tablet Take 1 tablet (300 mg total) by mouth at bedtime. 30 tablet 5  . warfarin (COUMADIN) 2 MG tablet TAKE ONE TABLET BY MOUTH ONCE DAILY 30 tablet 5  . mupirocin ointment (BACTROBAN) 2 % Apply 1 application topically 2 (two) times daily. (Patient not taking: Reported on 11/17/2017) 22 g 0   Facility-Administered Medications Prior to Visit  Medication Dose Route Frequency Provider Last Rate Last Dose  . carbamide peroxide (DEBROX) 6.5 % otic solution 5 drop  5 drop Left EAR Once Juline Patch, MD        Review of Systems  Constitutional: Negative for chills, fever, malaise/fatigue and weight loss.  HENT: Negative for ear discharge,  ear pain and sore throat.   Eyes: Negative for blurred vision.  Respiratory: Negative for cough, sputum production, shortness of breath and wheezing.   Cardiovascular: Positive for chest pain. Negative for palpitations, leg swelling and near-syncope.  Gastrointestinal: Negative for abdominal pain, blood in stool, constipation, diarrhea, heartburn, melena and nausea.  Genitourinary: Negative for dysuria, frequency, hematuria and urgency.  Musculoskeletal: Negative for back pain, joint pain, myalgias and neck pain.  Skin: Negative for rash.  Neurological: Negative for dizziness, tingling, sensory change, focal weakness and headaches.  Endo/Heme/Allergies: Negative for  environmental allergies and polydipsia. Does not bruise/bleed easily.  Psychiatric/Behavioral: Negative for depression and suicidal ideas. The patient is not nervous/anxious and does not have insomnia.      Objective  Vitals:   11/25/17 0941  BP: 100/70  Pulse: 76  Weight: 166 lb (75.3 kg)  Height: 5\' 4"  (1.626 m)    Physical Exam  Constitutional: She is well-developed, well-nourished, and in no distress. No distress.  HENT:  Head: Normocephalic and atraumatic.  Right Ear: External ear normal.  Left Ear: External ear normal.  Nose: Nose normal.  Mouth/Throat: Oropharynx is clear and moist.  Eyes: Conjunctivae and EOM are normal. Pupils are equal, round, and reactive to light. Right eye exhibits no discharge. Left eye exhibits no discharge.  Neck: Normal range of motion. Neck supple. No JVD present. No thyromegaly present.  Cardiovascular: Normal rate, regular rhythm, normal heart sounds and intact distal pulses. Exam reveals no gallop and no friction rub.  No murmur heard. Pulmonary/Chest: Effort normal and breath sounds normal. She has no wheezes. She has no rales.  Abdominal: Soft. Bowel sounds are normal. She exhibits no mass. There is no tenderness. There is no guarding.  Musculoskeletal: Normal range of motion. She exhibits no edema.  Lymphadenopathy:    She has no cervical adenopathy.  Neurological: She is alert.  Skin: Skin is warm and dry. She is not diaphoretic.  Psychiatric: Mood and affect normal.  Nursing note and vitals reviewed.     Assessment & Plan  Problem List Items Addressed This Visit      Cardiovascular and Mediastinum   Essential hypertension    Other Visit Diagnoses    Chest wall pain    -  Primary   Relevant Orders   Renal Function Panel   Hyponatremia          No orders of the defined types were placed in this encounter.     Dr. [facility]  11/25/17

## 2017-11-26 LAB — RENAL FUNCTION PANEL
Albumin: 4.3 g/dL (ref 3.5–4.7)
BUN/Creatinine Ratio: 21 (ref 12–28)
BUN: 14 mg/dL (ref 8–27)
CALCIUM: 9.5 mg/dL (ref 8.7–10.3)
CO2: 26 mmol/L (ref 20–29)
CREATININE: 0.68 mg/dL (ref 0.57–1.00)
Chloride: 95 mmol/L — ABNORMAL LOW (ref 96–106)
GFR calc Af Amer: 90 mL/min/{1.73_m2} (ref >59)
GFR calc non Af Amer: 78 mL/min/{1.73_m2} (ref >59)
Glucose: 95 mg/dL (ref 65–99)
PHOSPHORUS: 3.8 mg/dL (ref 2.5–4.5)
Potassium: 5.1 mmol/L (ref 3.5–5.2)
Sodium: 135 mmol/L (ref 134–144)

## 2017-12-17 ENCOUNTER — Other Ambulatory Visit: Payer: Medicare Other

## 2017-12-17 DIAGNOSIS — I482 Chronic atrial fibrillation, unspecified: Secondary | ICD-10-CM

## 2017-12-18 LAB — PROTIME-INR
INR: 2.2 — AB (ref 0.8–1.2)
Prothrombin Time: 21.9 s — ABNORMAL HIGH (ref 9.1–12.0)

## 2018-01-14 ENCOUNTER — Other Ambulatory Visit: Payer: Medicare Other

## 2018-01-14 DIAGNOSIS — Z7901 Long term (current) use of anticoagulants: Secondary | ICD-10-CM | POA: Diagnosis not present

## 2018-01-15 LAB — PROTIME-INR
INR: 1.9 — ABNORMAL HIGH (ref 0.8–1.2)
Prothrombin Time: 19.2 s — ABNORMAL HIGH (ref 9.1–12.0)

## 2018-01-31 ENCOUNTER — Emergency Department
Admission: EM | Admit: 2018-01-31 | Discharge: 2018-01-31 | Disposition: A | Discharge status: Home / Self Care | Payer: Medicare Other | Attending: Emergency Medicine | Admitting: Emergency Medicine

## 2018-01-31 ENCOUNTER — Emergency Department: Payer: Medicare Other

## 2018-01-31 ENCOUNTER — Other Ambulatory Visit: Payer: Self-pay

## 2018-01-31 ENCOUNTER — Encounter: Payer: Self-pay | Admitting: Emergency Medicine

## 2018-01-31 DIAGNOSIS — M1611 Unilateral primary osteoarthritis, right hip: Secondary | ICD-10-CM | POA: Insufficient documentation

## 2018-01-31 DIAGNOSIS — Z96649 Presence of unspecified artificial hip joint: Secondary | ICD-10-CM

## 2018-01-31 DIAGNOSIS — Z79899 Other long term (current) drug therapy: Secondary | ICD-10-CM | POA: Insufficient documentation

## 2018-01-31 DIAGNOSIS — Z96641 Presence of right artificial hip joint: Secondary | ICD-10-CM | POA: Insufficient documentation

## 2018-01-31 DIAGNOSIS — Z471 Aftercare following joint replacement surgery: Secondary | ICD-10-CM | POA: Diagnosis not present

## 2018-01-31 DIAGNOSIS — I10 Essential (primary) hypertension: Secondary | ICD-10-CM | POA: Insufficient documentation

## 2018-01-31 DIAGNOSIS — M25551 Pain in right hip: Secondary | ICD-10-CM | POA: Diagnosis present

## 2018-01-31 DIAGNOSIS — Z7901 Long term (current) use of anticoagulants: Secondary | ICD-10-CM | POA: Diagnosis not present

## 2018-01-31 MED ORDER — METHYLPREDNISOLONE 4 MG PO TABS: 4.0000 mg | ORAL_TABLET | Freq: Every day | ORAL | 0 refills | Status: DC

## 2018-01-31 NOTE — ED Provider Notes (Signed)
[facility] EMERGENCY DEPARTMENT Provider Note   CSN: [id number] Arrival date & time: 01/31/18  1409     History   Chief Complaint Chief Complaint  Patient presents with  . Hip Pain    HPI Laurie Fuentes is a 84 y.o. female.  Presents to the emergency department for evaluation of right hip pain.  She is status post right hip hemiarthroplasty for femoral neck fracture performed years ago, possibly 2011.  She had been doing well up until this morning upon awakening developed right lateral hip, groin pain.  She denies any back pain, burning numbness or tingling or radicular symptoms.  Her pain is 5 out of 10 only with walking.  She denies any pain with sitting.  She lives at home with family, ambulates with a cane, states she is still able to ambulate with her cane.  She denies any recent falls or trauma.  She denies any other pain throughout her body.  No urinary symptoms.  HPI  Past Medical History:  Diagnosis Date  . GERD (gastroesophageal reflux disease)   . Hypertension   . Hypokalemia   . Stroke [facility])     Patient Active Problem List   Diagnosis Date Noted  . Rib contusion, left, subsequent encounter 08/08/2017  . Chronic atrial fibrillation ([place]) 05/28/2016  . Hypokalemia 05/28/2016  . Anticoagulant long-term use 04/02/2016  . Hip fracture requiring operative repair ([place]) 01/07/2016  . Essential hypertension 11/29/2015  . Cerebral vascular disease 11/29/2015  . GERD (gastroesophageal reflux disease) 06/14/2015  . DVT of lower extremity (deep venous thrombosis) ([place]) 06/24/2014    Past Surgical History:  Procedure Laterality Date  . GALLBLADDER SURGERY    . HIP FRACTURE SURGERY Right      OB History   None      Home Medications    Prior to Admission medications   Medication Sig Start Date End Date Taking? Authorizing Provider  amLODipine (NORVASC) 5 MG tablet Take 1 tablet (5 mg total) by mouth daily. 11/03/17   Juline Patch, MD    hydrochlorothiazide (HYDRODIURIL) 25 MG tablet Take 1 tablet (25 mg total) by mouth daily. 11/03/17   Juline Patch, MD  methylPREDNISolone (MEDROL) 4 MG tablet Take 1 tablet (4 mg total) by mouth daily. 6-day taper, take as directed 01/31/18   Duanne Guess, PA-C  mupirocin ointment (BACTROBAN) 2 % Apply 1 application topically 2 (two) times daily. Patient not taking: Reported on 11/17/2017 05/13/17   Juline Patch, MD  potassium chloride SA (K-DUR,KLOR-CON) 20 MEQ tablet Take 1 tablet (20 mEq total) by mouth daily. 11/03/17   Juline Patch, MD  ranitidine (ZANTAC) 300 MG tablet Take 1 tablet (300 mg total) by mouth at bedtime. 11/03/17   Juline Patch, MD  warfarin (COUMADIN) 2 MG tablet TAKE ONE TABLET BY MOUTH ONCE DAILY 10/29/17   Juline Patch, MD    Family History Family History  Problem Relation Age of Onset  . Diabetes Sister     Social History Social History   Tobacco Use  . Smoking status: Never Smoker  . Smokeless tobacco: Never Used  Substance Use Topics  . Alcohol use: No    Alcohol/week: 0.0 oz  . Drug use: No     Allergies   Patient has no known allergies.   Review of Systems Review of Systems  Constitutional: Negative for fever.  Respiratory: Negative for shortness of breath.   Cardiovascular: Negative for chest pain.  Gastrointestinal:  Negative for abdominal pain.  Genitourinary: Negative for difficulty urinating, dysuria and urgency.  Musculoskeletal: Positive for arthralgias. Negative for back pain and myalgias.  Skin: Negative for rash.  Neurological: Negative for dizziness and headaches.     Physical Exam Updated Vital Signs BP (!) 140/58   Pulse 64   Temp 97.9 F (36.6 C) (Oral)   Resp 20   Ht 5\' 6"  (1.676 m)   Wt 77.1 kg (170 lb)   SpO2 100%   BMI 27.44 kg/m   Physical Exam  Constitutional: She is oriented to person, place, and time. She appears well-developed and well-nourished.  HENT:  Head: Normocephalic and  atraumatic.  Eyes: Conjunctivae are normal.  Neck: Normal range of motion.  Cardiovascular: Normal rate.  Pulmonary/Chest: Effort normal. No respiratory distress. She has no wheezes.  Musculoskeletal: Normal range of motion.  Examination of the lumbar spine shows no spinous process tenderness.  She is nontender along the sacrum.  She has no paravertebral muscle tenderness.  She is nontender along the SI joints or sciatic notches.  She is mildly tender along the right hip trochanteric bursa.  She has no anterior thigh tenderness.  She has good internal and external rotation of right hip with mild discomfort with right hip internal rotation.  No significant stiffness with hip internal rotation.  No swelling or edema throughout bilateral lower extremities.  Neurological: She is alert and oriented to person, place, and time.  Skin: Skin is warm. No rash noted.  Psychiatric: She has a normal mood and affect. Her behavior is normal. Thought content normal.     ED Treatments / Results  Labs (all labs ordered are listed, but only abnormal results are displayed) Labs Reviewed - No data to display  EKG None  Radiology Dg Hip Unilat W Or Wo Pelvis 2-3 Views Right  Result Date: 01/31/2018 CLINICAL DATA:  Acute right hip pain without known injury. EXAM: DG HIP (WITH OR WITHOUT PELVIS) 2-3V RIGHT COMPARISON:  Radiographs of October 18, 2017. FINDINGS: Status post right hip arthroplasty. No fracture or dislocation is noted. Diffuse osteopenia is noted. IMPRESSION: No acute abnormality seen in the right hip. Electronically Signed   By: Marijo Conception, M.D.   On: 01/31/2018 17:22    Procedures Procedures (including critical care time)  Medications Ordered in ED Medications - No data to display   Initial Impression / Assessment and Plan / ED Course  I have reviewed the triage vital signs and the nursing notes.  Pertinent labs & imaging results that were available during my care of the patient  were reviewed by me and considered in my medical decision making (see chart for details).    84 year old female with sudden onset of right hip pain located in her groin and lateral hip this morning.  No falls or trauma.  She is status post right hip hemiarthroplasty performed years ago, possibly 2011.  X-rays today ordered and reviewed by me today show no evidence of acute periprosthetic fracture.  There is no sign of dislocation.  She does appear to have some arthritic changes on the acetabulum.  Patient's location of pain and physical exam findings which include painful right hip internal rotation concerning for right hip osteoarthritis.  Patient will be placed on a walker which she has at home and she will start a Medrol Dosepak.  She is unable to tolerate NSAIDs due to being on Coumadin.  She will continue with Tylenol as needed for pain.  She will follow-up  with orthopedics.  Patient may need conversion of partial hip replacement to total hip replacement.   Final Clinical Impressions(s) / ED Diagnoses   Final diagnoses:  Primary osteoarthritis of right hip  Status post right hip hemiarthroplasty    ED Discharge Orders        Ordered    methylPREDNISolone (MEDROL) 4 MG tablet  Daily     01/31/18 1736       Duanne Guess, PA-C 01/31/18 [place], [place], MD 02/01/18 1544

## 2018-01-31 NOTE — ED Triage Notes (Signed)
R hip pain when she got up this am. States hip replacement same hip. Denies fall or injury.

## 2018-01-31 NOTE — Discharge Instructions (Signed)
Please ambulate with a walker.  Take Medrol Dosepak as prescribed.  You may use Tylenol for additional pain relief.  Return to the ER for any increasing pain worsening symptoms or urgent changes in your health.  Follow-up with orthopedic office to discuss pain.

## 2018-02-02 ENCOUNTER — Other Ambulatory Visit: Payer: Self-pay | Admitting: Family Medicine

## 2018-02-03 ENCOUNTER — Other Ambulatory Visit: Payer: Self-pay

## 2018-02-11 ENCOUNTER — Other Ambulatory Visit: Payer: Medicare Other

## 2018-02-11 DIAGNOSIS — Z7901 Long term (current) use of anticoagulants: Secondary | ICD-10-CM | POA: Diagnosis not present

## 2018-02-12 LAB — PROTIME-INR
INR: 5.9 (ref 0.8–1.2)
PROTHROMBIN TIME: 55.7 s — AB (ref 9.1–12.0)

## 2018-02-19 ENCOUNTER — Other Ambulatory Visit: Payer: Medicare Other

## 2018-02-19 DIAGNOSIS — I4891 Unspecified atrial fibrillation: Secondary | ICD-10-CM

## 2018-02-20 ENCOUNTER — Other Ambulatory Visit: Payer: Self-pay

## 2018-02-20 ENCOUNTER — Telehealth: Payer: Self-pay

## 2018-02-20 ENCOUNTER — Emergency Department
Admission: EM | Admit: 2018-02-20 | Discharge: 2018-02-20 | Disposition: A | Discharge status: Home / Self Care | Payer: Medicare Other | Attending: Emergency Medicine | Admitting: Emergency Medicine

## 2018-02-20 DIAGNOSIS — I10 Essential (primary) hypertension: Secondary | ICD-10-CM | POA: Diagnosis not present

## 2018-02-20 DIAGNOSIS — M1611 Unilateral primary osteoarthritis, right hip: Secondary | ICD-10-CM

## 2018-02-20 DIAGNOSIS — M1711 Unilateral primary osteoarthritis, right knee: Secondary | ICD-10-CM | POA: Diagnosis not present

## 2018-02-20 DIAGNOSIS — Z79899 Other long term (current) drug therapy: Secondary | ICD-10-CM | POA: Insufficient documentation

## 2018-02-20 DIAGNOSIS — Z7901 Long term (current) use of anticoagulants: Secondary | ICD-10-CM | POA: Insufficient documentation

## 2018-02-20 DIAGNOSIS — M25551 Pain in right hip: Secondary | ICD-10-CM | POA: Diagnosis present

## 2018-02-20 DIAGNOSIS — Z8673 Personal history of transient ischemic attack (TIA), and cerebral infarction without residual deficits: Secondary | ICD-10-CM | POA: Diagnosis not present

## 2018-02-20 LAB — PROTIME-INR
INR: 5.8 — AB (ref 0.8–1.2)
Prothrombin Time: 54.4 s — ABNORMAL HIGH (ref 9.1–12.0)

## 2018-02-20 MED ORDER — TRAMADOL-ACETAMINOPHEN 37.5-325 MG PO TABS: 1.0000 | ORAL_TABLET | Freq: Two times a day (BID) | ORAL | 0 refills | Status: DC

## 2018-02-20 MED ORDER — TRAMADOL-ACETAMINOPHEN 37.5-325 MG PO TABS
1.0000 | ORAL_TABLET | Freq: Once | ORAL | Status: AC
  Administered 2018-02-20: 1 via ORAL
  Filled 2018-02-20: qty 1

## 2018-02-20 NOTE — Telephone Encounter (Signed)
Called with hold med x 7 days and start 1mg  qday x 7 days- then recheck lab

## 2018-02-20 NOTE — Discharge Instructions (Signed)
Follow-up with orthopedic clinic for definitive evaluation and treatment.

## 2018-02-20 NOTE — ED Notes (Signed)
Patient is complaining of pain to her right hip x 3 months but worse the past 2 days.  Granddaughter states patient is having increased difficulty walking and appears more pale than normal.  Patient states she is feeling better after having tylenol.  Patient is very hard of hearing.  Respirations are even and not labored at this time.  Patient is alert.

## 2018-02-20 NOTE — ED Provider Notes (Signed)
[facility] Emergency Department Provider Note   ____________________________________________   First MD Initiated Contact with Patient 02/20/18 1011     (approximate)  I have reviewed the triage vital signs and the nursing notes.   HISTORY  Chief Complaint Hip Pain  History via daughter and granddaughter.  HPI Laurie Fuentes is a 84 y.o. female patient presents with right hip pain.  Patient status post hip hemiarthoplasty approximately 8-10 years ago.  Patient state pain increased with ambulation.  Patient normally ambulates with support of a cane.  Patient denies any recent injury.  Patient was seen in this facility on 01/31/2018 and was advised to follow orthopedic for definitive treatment.  Daughter and granddaughter said they were not aware of this follow-up since they did not bring her here for her past visit.  Patient rates the pain as a 5/10.  Patient described the pain is "aching".  No palliative measure for complaint.  Past Medical History:  Diagnosis Date  . GERD (gastroesophageal reflux disease)   . Hypertension   . Hypokalemia   . Stroke [facility])     Patient Active Problem List   Diagnosis Date Noted  . Rib contusion, left, subsequent encounter 08/08/2017  . Chronic atrial fibrillation ([place]) 05/28/2016  . Hypokalemia 05/28/2016  . Anticoagulant long-term use 04/02/2016  . Hip fracture requiring operative repair ([place]) 01/07/2016  . Essential hypertension 11/29/2015  . Cerebral vascular disease 11/29/2015  . GERD (gastroesophageal reflux disease) 06/14/2015  . DVT of lower extremity (deep venous thrombosis) ([place]) 06/24/2014    Past Surgical History:  Procedure Laterality Date  . GALLBLADDER SURGERY    . HIP FRACTURE SURGERY Right     Prior to Admission medications   Medication Sig Start Date End Date Taking? Authorizing Provider  amLODipine (NORVASC) 5 MG tablet Take 1 tablet (5 mg total) by mouth daily. 11/03/17   Juline Patch, MD   hydrochlorothiazide (HYDRODIURIL) 25 MG tablet Take 1 tablet (25 mg total) by mouth daily. 11/03/17   Juline Patch, MD  KLOR-CON M20 20 MEQ tablet TAKE 1 TABLET BY MOUTH ONCE DAILY 02/02/18   Juline Patch, MD  methylPREDNISolone (MEDROL) 4 MG tablet Take 1 tablet (4 mg total) by mouth daily. 6-day taper, take as directed 01/31/18   Duanne Guess, PA-C  mupirocin ointment (BACTROBAN) 2 % Apply 1 application topically 2 (two) times daily. Patient not taking: Reported on 11/17/2017 05/13/17   Juline Patch, MD  ranitidine (ZANTAC) 300 MG tablet Take 1 tablet (300 mg total) by mouth at bedtime. 11/03/17   Juline Patch, MD  traMADol-acetaminophen (ULTRACET) 37.5-325 MG tablet Take 1 tablet by mouth 2 (two) times daily. 02/20/18   Sable Feil, PA-C  warfarin (COUMADIN) 2 MG tablet TAKE ONE TABLET BY MOUTH ONCE DAILY 10/29/17   Juline Patch, MD    Allergies Patient has no known allergies.  Family History  Problem Relation Age of Onset  . Diabetes Sister     Social History Social History   Tobacco Use  . Smoking status: Never Smoker  . Smokeless tobacco: Never Used  Substance Use Topics  . Alcohol use: No    Alcohol/week: 0.0 oz  . Drug use: No    Review of Systems Constitutional: No fever/chills Eyes: No visual changes. ENT: No sore throat. Cardiovascular: Denies chest pain. Respiratory: Denies shortness of breath. Gastrointestinal: No abdominal pain.  No nausea, no vomiting.  No diarrhea.  No constipation. Genitourinary: Negative for  dysuria. Musculoskeletal: Right hip pain.. Skin: Negative for rash. Neurological: Negative for headaches, focal weakness or numbness. Endocrine:Hypertension   ____________________________________________   PHYSICAL EXAM:  VITAL SIGNS: ED Triage Vitals  Enc Vitals Group     BP 02/20/18 0959 (!) 158/75     Pulse Rate 02/20/18 0959 72     Resp 02/20/18 0959 18     Temp 02/20/18 0959 98.9 F (37.2 C)     Temp Source  02/20/18 0959 Oral     SpO2 02/20/18 0959 99 %     Weight 02/20/18 1004 170 lb (77.1 kg)     Height 02/20/18 1004 5\' 6"  (1.676 m)     Head Circumference --      Peak Flow --      Pain Score 02/20/18 1004 5     Pain Loc --      Pain Edu? --      Excl. in [place]? --    Constitutional: Alert and oriented. Well appearing and in no acute distress. Cardiovascular: Normal rate, regular rhythm. Grossly normal heart sounds.  Good peripheral circulation.  Elevated blood pressure Respiratory: Normal respiratory effort.  No retractions. Lungs CTAB. Gastrointestinal: Soft and nontender. No distention. No abdominal bruits. No CVA tenderness. Musculoskeletal: No obvious deformity to the right hip.  No leg length discrepancy.  Patient has discomfort with right hip internal and external rotation.  Flexion within normal range.  No effusion or swelling throughout the lower extremities.  Neurologic:  Normal speech and language. No gross focal neurologic deficits are appreciated. No gait instability. Skin:  Skin is warm, dry and intact. No rash noted. Psychiatric: Mood and affect are normal. Speech and behavior are normal.  ____________________________________________   LABS (all labs ordered are listed, but only abnormal results are displayed)  Labs Reviewed - No data to display ____________________________________________  EKG   ____________________________________________  RADIOLOGY Reviewed x-ray from previous visit consistent with osteopenia and status post right hip arthroplasty.   Official radiology report(s): No results found.  ____________________________________________   PROCEDURES  Procedure(s) performed: None  Procedures  Critical Care performed: No  ____________________________________________   INITIAL IMPRESSION / ASSESSMENT AND PLAN / ED COURSE  As part of my medical decision making, I reviewed the following data within the electronic MEDICAL RECORD NUMBER    Right hip  pain secondary to osteoarthritis.  Patient is status post right hip arthroplasty.  Advised to follow-up with orthopedic for definitive evaluation and treatment.      ____________________________________________   FINAL CLINICAL IMPRESSION(S) / ED DIAGNOSES  Final diagnoses:  Primary osteoarthritis of right hip     ED Discharge Orders        Ordered    traMADol-acetaminophen (ULTRACET) 37.5-325 MG tablet  2 times daily     02/20/18 1034       Note:  This document was prepared using Dragon voice recognition software and may include unintentional dictation errors.    Sable Feil, PA-C 02/20/18 1051    Arta Silence, MD 02/20/18 [phone]

## 2018-02-20 NOTE — ED Triage Notes (Signed)
Pt states that she is having rt sided hip pain since yesterday,family denies fall or injury, pt has been able to walk and is seated in a wheelchair at this time

## 2018-03-04 DIAGNOSIS — G8929 Other chronic pain: Secondary | ICD-10-CM | POA: Diagnosis not present

## 2018-03-04 DIAGNOSIS — M1611 Unilateral primary osteoarthritis, right hip: Secondary | ICD-10-CM | POA: Diagnosis not present

## 2018-03-04 DIAGNOSIS — Z96641 Presence of right artificial hip joint: Secondary | ICD-10-CM | POA: Diagnosis not present

## 2018-03-04 DIAGNOSIS — M25551 Pain in right hip: Secondary | ICD-10-CM | POA: Diagnosis not present

## 2018-03-12 ENCOUNTER — Other Ambulatory Visit: Payer: Medicare Other

## 2018-03-12 DIAGNOSIS — Z7901 Long term (current) use of anticoagulants: Secondary | ICD-10-CM | POA: Diagnosis not present

## 2018-03-13 ENCOUNTER — Other Ambulatory Visit: Payer: Self-pay

## 2018-03-13 LAB — PROTIME-INR
INR: 1.1 (ref 0.8–1.2)
PROTHROMBIN TIME: 11.3 s (ref 9.1–12.0)

## 2018-03-16 ENCOUNTER — Other Ambulatory Visit: Payer: Self-pay | Admitting: Family Medicine

## 2018-03-16 DIAGNOSIS — Z7901 Long term (current) use of anticoagulants: Secondary | ICD-10-CM

## 2018-03-20 DIAGNOSIS — M545 Low back pain: Secondary | ICD-10-CM | POA: Diagnosis not present

## 2018-03-20 DIAGNOSIS — M25551 Pain in right hip: Secondary | ICD-10-CM | POA: Diagnosis not present

## 2018-03-20 DIAGNOSIS — G8929 Other chronic pain: Secondary | ICD-10-CM | POA: Diagnosis not present

## 2018-03-20 DIAGNOSIS — M1611 Unilateral primary osteoarthritis, right hip: Secondary | ICD-10-CM | POA: Diagnosis not present

## 2018-03-26 ENCOUNTER — Other Ambulatory Visit: Payer: Medicare Other

## 2018-03-26 DIAGNOSIS — Z7901 Long term (current) use of anticoagulants: Secondary | ICD-10-CM | POA: Diagnosis not present

## 2018-03-27 LAB — PROTIME-INR
INR: 1.1 (ref 0.8–1.2)
Prothrombin Time: 11.3 s (ref 9.1–12.0)

## 2018-04-09 ENCOUNTER — Other Ambulatory Visit: Payer: Medicare Other

## 2018-04-09 DIAGNOSIS — Z7901 Long term (current) use of anticoagulants: Secondary | ICD-10-CM | POA: Diagnosis not present

## 2018-04-10 LAB — PROTIME-INR
INR: 1.2 (ref 0.8–1.2)
PROTHROMBIN TIME: 11.9 s (ref 9.1–12.0)

## 2018-04-19 ENCOUNTER — Other Ambulatory Visit: Payer: Self-pay | Admitting: Family Medicine

## 2018-04-19 DIAGNOSIS — I10 Essential (primary) hypertension: Secondary | ICD-10-CM

## 2018-04-23 ENCOUNTER — Other Ambulatory Visit: Payer: Medicare Other

## 2018-04-23 DIAGNOSIS — Z7901 Long term (current) use of anticoagulants: Secondary | ICD-10-CM | POA: Diagnosis not present

## 2018-04-24 ENCOUNTER — Other Ambulatory Visit: Payer: Self-pay

## 2018-04-24 LAB — PROTIME-INR
INR: 1.5 — ABNORMAL HIGH (ref 0.8–1.2)
Prothrombin Time: 15.6 s — ABNORMAL HIGH (ref 9.1–12.0)

## 2018-05-02 ENCOUNTER — Emergency Department: Payer: Medicare Other

## 2018-05-02 ENCOUNTER — Emergency Department
Admission: EM | Admit: 2018-05-02 | Discharge: 2018-05-02 | Disposition: A | Discharge status: Other Acute Inpatient Hospital | Payer: Medicare Other | Attending: Emergency Medicine | Admitting: Emergency Medicine

## 2018-05-02 DIAGNOSIS — Z7901 Long term (current) use of anticoagulants: Secondary | ICD-10-CM | POA: Diagnosis not present

## 2018-05-02 DIAGNOSIS — Y999 Unspecified external cause status: Secondary | ICD-10-CM | POA: Insufficient documentation

## 2018-05-02 DIAGNOSIS — T797XXA Traumatic subcutaneous emphysema, initial encounter: Secondary | ICD-10-CM | POA: Diagnosis not present

## 2018-05-02 DIAGNOSIS — M1611 Unilateral primary osteoarthritis, right hip: Secondary | ICD-10-CM | POA: Diagnosis not present

## 2018-05-02 DIAGNOSIS — I10 Essential (primary) hypertension: Secondary | ICD-10-CM | POA: Diagnosis present

## 2018-05-02 DIAGNOSIS — S7291XA Unspecified fracture of right femur, initial encounter for closed fracture: Secondary | ICD-10-CM | POA: Diagnosis not present

## 2018-05-02 DIAGNOSIS — S199XXA Unspecified injury of neck, initial encounter: Secondary | ICD-10-CM | POA: Diagnosis not present

## 2018-05-02 DIAGNOSIS — S72001A Fracture of unspecified part of neck of right femur, initial encounter for closed fracture: Secondary | ICD-10-CM

## 2018-05-02 DIAGNOSIS — M84459A Pathological fracture, hip, unspecified, initial encounter for fracture: Secondary | ICD-10-CM | POA: Diagnosis not present

## 2018-05-02 DIAGNOSIS — J9811 Atelectasis: Secondary | ICD-10-CM | POA: Diagnosis not present

## 2018-05-02 DIAGNOSIS — M8580 Other specified disorders of bone density and structure, unspecified site: Secondary | ICD-10-CM | POA: Diagnosis not present

## 2018-05-02 DIAGNOSIS — Z79899 Other long term (current) drug therapy: Secondary | ICD-10-CM | POA: Diagnosis not present

## 2018-05-02 DIAGNOSIS — S299XXA Unspecified injury of thorax, initial encounter: Secondary | ICD-10-CM | POA: Diagnosis not present

## 2018-05-02 DIAGNOSIS — M978XXA Periprosthetic fracture around other internal prosthetic joint, initial encounter: Secondary | ICD-10-CM | POA: Diagnosis not present

## 2018-05-02 DIAGNOSIS — R278 Other lack of coordination: Secondary | ICD-10-CM | POA: Diagnosis not present

## 2018-05-02 DIAGNOSIS — R531 Weakness: Secondary | ICD-10-CM | POA: Diagnosis not present

## 2018-05-02 DIAGNOSIS — S79911A Unspecified injury of right hip, initial encounter: Secondary | ICD-10-CM | POA: Diagnosis present

## 2018-05-02 DIAGNOSIS — S72001D Fracture of unspecified part of neck of right femur, subsequent encounter for closed fracture with routine healing: Secondary | ICD-10-CM | POA: Diagnosis not present

## 2018-05-02 DIAGNOSIS — M9701XA Periprosthetic fracture around internal prosthetic right hip joint, initial encounter: Secondary | ICD-10-CM | POA: Diagnosis not present

## 2018-05-02 DIAGNOSIS — K219 Gastro-esophageal reflux disease without esophagitis: Secondary | ICD-10-CM | POA: Diagnosis not present

## 2018-05-02 DIAGNOSIS — W010XXA Fall on same level from slipping, tripping and stumbling without subsequent striking against object, initial encounter: Secondary | ICD-10-CM | POA: Diagnosis not present

## 2018-05-02 DIAGNOSIS — Z4789 Encounter for other orthopedic aftercare: Secondary | ICD-10-CM | POA: Diagnosis not present

## 2018-05-02 DIAGNOSIS — M25551 Pain in right hip: Secondary | ICD-10-CM | POA: Diagnosis not present

## 2018-05-02 DIAGNOSIS — Z471 Aftercare following joint replacement surgery: Secondary | ICD-10-CM | POA: Diagnosis not present

## 2018-05-02 DIAGNOSIS — R590 Localized enlarged lymph nodes: Secondary | ICD-10-CM | POA: Diagnosis present

## 2018-05-02 DIAGNOSIS — M6250 Muscle wasting and atrophy, not elsewhere classified, unspecified site: Secondary | ICD-10-CM | POA: Diagnosis not present

## 2018-05-02 DIAGNOSIS — I639 Cerebral infarction, unspecified: Secondary | ICD-10-CM | POA: Diagnosis not present

## 2018-05-02 DIAGNOSIS — Z8673 Personal history of transient ischemic attack (TIA), and cerebral infarction without residual deficits: Secondary | ICD-10-CM | POA: Insufficient documentation

## 2018-05-02 DIAGNOSIS — I82409 Acute embolism and thrombosis of unspecified deep veins of unspecified lower extremity: Secondary | ICD-10-CM | POA: Diagnosis not present

## 2018-05-02 DIAGNOSIS — Z86718 Personal history of other venous thrombosis and embolism: Secondary | ICD-10-CM | POA: Diagnosis not present

## 2018-05-02 DIAGNOSIS — H9190 Unspecified hearing loss, unspecified ear: Secondary | ICD-10-CM | POA: Diagnosis present

## 2018-05-02 DIAGNOSIS — Y9389 Activity, other specified: Secondary | ICD-10-CM | POA: Insufficient documentation

## 2018-05-02 DIAGNOSIS — R262 Difficulty in walking, not elsewhere classified: Secondary | ICD-10-CM | POA: Diagnosis not present

## 2018-05-02 DIAGNOSIS — R339 Retention of urine, unspecified: Secondary | ICD-10-CM | POA: Diagnosis not present

## 2018-05-02 DIAGNOSIS — W19XXXA Unspecified fall, initial encounter: Secondary | ICD-10-CM | POA: Diagnosis not present

## 2018-05-02 DIAGNOSIS — T84010A Broken internal right hip prosthesis, initial encounter: Secondary | ICD-10-CM | POA: Diagnosis not present

## 2018-05-02 DIAGNOSIS — E785 Hyperlipidemia, unspecified: Secondary | ICD-10-CM | POA: Diagnosis present

## 2018-05-02 DIAGNOSIS — M1990 Unspecified osteoarthritis, unspecified site: Secondary | ICD-10-CM | POA: Diagnosis not present

## 2018-05-02 DIAGNOSIS — Z96641 Presence of right artificial hip joint: Secondary | ICD-10-CM | POA: Diagnosis not present

## 2018-05-02 DIAGNOSIS — S0990XA Unspecified injury of head, initial encounter: Secondary | ICD-10-CM | POA: Diagnosis not present

## 2018-05-02 DIAGNOSIS — G8911 Acute pain due to trauma: Secondary | ICD-10-CM | POA: Diagnosis not present

## 2018-05-02 DIAGNOSIS — I6990 Unspecified sequelae of unspecified cerebrovascular disease: Secondary | ICD-10-CM | POA: Diagnosis not present

## 2018-05-02 DIAGNOSIS — S72391A Other fracture of shaft of right femur, initial encounter for closed fracture: Secondary | ICD-10-CM | POA: Diagnosis not present

## 2018-05-02 DIAGNOSIS — Y92002 Bathroom of unspecified non-institutional (private) residence single-family (private) house as the place of occurrence of the external cause: Secondary | ICD-10-CM | POA: Insufficient documentation

## 2018-05-02 DIAGNOSIS — R410 Disorientation, unspecified: Secondary | ICD-10-CM | POA: Diagnosis not present

## 2018-05-02 LAB — CBC WITH DIFFERENTIAL/PLATELET
BASOS ABS: 0.1 10*3/uL (ref 0–0.1)
BASOS PCT: 1 %
EOS ABS: 0.1 10*3/uL (ref 0–0.7)
EOS PCT: 1 %
HCT: 36.8 % (ref 35.0–47.0)
Hemoglobin: 12.3 g/dL (ref 12.0–16.0)
Lymphocytes Relative: 12 %
Lymphs Abs: 0.9 10*3/uL — ABNORMAL LOW (ref 1.0–3.6)
MCH: 30.9 pg (ref 26.0–34.0)
MCHC: 33.4 g/dL (ref 32.0–36.0)
MCV: 92.6 fL (ref 80.0–100.0)
Monocytes Absolute: 0.4 10*3/uL (ref 0.2–0.9)
Monocytes Relative: 6 %
NEUTROS PCT: 80 %
Neutro Abs: 6 10*3/uL (ref 1.4–6.5)
PLATELETS: 221 10*3/uL (ref 150–440)
RBC: 3.97 MIL/uL (ref 3.80–5.20)
RDW: 13.5 % (ref 11.5–14.5)
WBC: 7.4 10*3/uL (ref 3.6–11.0)

## 2018-05-02 LAB — BASIC METABOLIC PANEL
ANION GAP: 7 (ref 5–15)
BUN: 17 mg/dL (ref 8–23)
CALCIUM: 8.9 mg/dL (ref 8.9–10.3)
CO2: 26 mmol/L (ref 22–32)
CREATININE: 0.58 mg/dL (ref 0.44–1.00)
Chloride: 100 mmol/L (ref 98–111)
Glucose, Bld: 112 mg/dL — ABNORMAL HIGH (ref 70–99)
Potassium: 4.1 mmol/L (ref 3.5–5.1)
SODIUM: 133 mmol/L — AB (ref 135–145)

## 2018-05-02 LAB — PROTIME-INR
INR: 1.51
Prothrombin Time: 18.1 seconds — ABNORMAL HIGH (ref 11.4–15.2)

## 2018-05-02 LAB — APTT: APTT: 30 s (ref 24–36)

## 2018-05-02 MED ORDER — MORPHINE SULFATE (PF) 2 MG/ML IV SOLN
2.0000 mg | Freq: Once | INTRAVENOUS | Status: AC
  Administered 2018-05-02: 2 mg via INTRAVENOUS

## 2018-05-02 MED ORDER — ONDANSETRON HCL 4 MG/2ML IJ SOLN
4.0000 mg | Freq: Once | INTRAMUSCULAR | Status: AC
  Administered 2018-05-02: 4 mg via INTRAVENOUS
  Filled 2018-05-02: qty 2

## 2018-05-02 MED ORDER — MORPHINE SULFATE (PF) 2 MG/ML IV SOLN
INTRAVENOUS | Status: AC
  Filled 2018-05-02: qty 1

## 2018-05-02 NOTE — ED Notes (Signed)
Pt requesting more pain meds. Informed MD of this. Family at bedside. Pt denies further needs.

## 2018-05-02 NOTE — ED Notes (Signed)
emtala reviewed by this RN 

## 2018-05-02 NOTE — ED Provider Notes (Signed)
[facility] Emergency Department Provider Note ____________________________________________   First MD Initiated Contact with Patient 05/02/18 0740     (approximate)  I have reviewed the triage vital signs and the nursing notes.   HISTORY  Chief Complaint Fall  HPI Laurie Fuentes is a 84 y.o. female with a history of stroke as well as atrial fibrillation on Coumadin who is presenting after a fall.  Says that she slipped while walking on the way to her bathroom this morning and fell onto her right hip.  Says that she may have also hit her head although does not report any headache or neck pain at this time.  EMS was called who found the patient's right lower extremity shortened and outwardly rotated.  Patient given 50 of fentanyl in route.  Says that she has no pain except when being transferred or trying to move the right lower extremity at this time.  Past Medical History:  Diagnosis Date  . GERD (gastroesophageal reflux disease)   . Hypertension   . Hypokalemia   . Stroke [facility])     Patient Active Problem List   Diagnosis Date Noted  . Rib contusion, left, subsequent encounter 08/08/2017  . Chronic atrial fibrillation ([place]) 05/28/2016  . Hypokalemia 05/28/2016  . Anticoagulant long-term use 04/02/2016  . Hip fracture requiring operative repair ([place]) 01/07/2016  . Essential hypertension 11/29/2015  . Cerebral vascular disease 11/29/2015  . GERD (gastroesophageal reflux disease) 06/14/2015  . DVT of lower extremity (deep venous thrombosis) ([place]) 06/24/2014    Past Surgical History:  Procedure Laterality Date  . GALLBLADDER SURGERY    . HIP FRACTURE SURGERY Right     Prior to Admission medications   Medication Sig Start Date End Date Taking? Authorizing Provider  amLODipine (NORVASC) 5 MG tablet TAKE 1 TABLET BY MOUTH ONCE DAILY 04/20/18   Juline Patch, MD  hydrochlorothiazide (HYDRODIURIL) 25 MG tablet Take 1 tablet (25 mg total) by mouth  daily. 11/03/17   Juline Patch, MD  KLOR-CON M20 20 MEQ tablet TAKE 1 TABLET BY MOUTH ONCE DAILY 02/02/18   Juline Patch, MD  methylPREDNISolone (MEDROL) 4 MG tablet Take 1 tablet (4 mg total) by mouth daily. 6-day taper, take as directed 01/31/18   Duanne Guess, PA-C  mupirocin ointment (BACTROBAN) 2 % Apply 1 application topically 2 (two) times daily. Patient not taking: Reported on 11/17/2017 05/13/17   Juline Patch, MD  ranitidine (ZANTAC) 300 MG tablet Take 1 tablet (300 mg total) by mouth at bedtime. 11/03/17   Juline Patch, MD  traMADol-acetaminophen (ULTRACET) 37.5-325 MG tablet Take 1 tablet by mouth 2 (two) times daily. 02/20/18   Sable Feil, PA-C  warfarin (COUMADIN) 1 MG tablet TAKE ONE TABLET BY MOUTH ONCE DAILY USE  AS  DIRECTED 03/16/18   Juline Patch, MD  warfarin (COUMADIN) 2 MG tablet TAKE ONE TABLET BY MOUTH ONCE DAILY 10/29/17   Juline Patch, MD    Allergies Patient has no known allergies.  Family History  Problem Relation Age of Onset  . Diabetes Sister     Social History Social History   Tobacco Use  . Smoking status: Never Smoker  . Smokeless tobacco: Never Used  Substance Use Topics  . Alcohol use: No    Alcohol/week: 0.0 oz  . Drug use: No    Review of Systems  Constitutional: No fever/chills Eyes: No visual changes. ENT: No sore throat. Cardiovascular: Denies chest pain. Respiratory: Denies  shortness of breath. Gastrointestinal: No abdominal pain.  No nausea, no vomiting.  No diarrhea.  No constipation. Genitourinary: Negative for dysuria. Musculoskeletal: Negative for back pain. Skin: Negative for rash. Neurological: Negative for headaches, focal weakness or numbness.   ____________________________________________   PHYSICAL EXAM:  VITAL SIGNS: ED Triage Vitals  Enc Vitals Group     BP --      Pulse --      Resp --      Temp --      Temp src --      SpO2 --      Weight 05/02/18 0737 173 lb 4.5 oz (78.6 kg)      Height --      Head Circumference --      Peak Flow --      Pain Score 05/02/18 0736 8     Pain Loc --      Pain Edu? --      Excl. in [place]? --     Constitutional: Alert and oriented.  in no acute distress. Eyes: Conjunctivae are normal.  Head: Atraumatic. Nose: No congestion/rhinnorhea. Mouth/Throat: Mucous membranes are moist.  Neck: No stridor.   Cardiovascular: Normal rate, regular rhythm. Grossly normal heart sounds.  Good peripheral circulation. Respiratory: Normal respiratory effort.  No retractions. Lungs CTAB. Gastrointestinal: Soft and nontender. No distention. No CVA tenderness. Musculoskeletal: Right lower extremity is shortened and outwardly rotated.  Neurovascularly intact with sensation to light touch as well as movement of the toes and intact dorsalis pedis pulses, bilaterally. Neurologic:  Normal speech and language. No gross focal neurologic deficits are appreciated. Skin:  Skin is warm, dry and intact. No rash noted. Psychiatric: Mood and affect are normal. Speech and behavior are normal.  ____________________________________________   LABS (all labs ordered are listed, but only abnormal results are displayed)  Labs Reviewed  CBC WITH DIFFERENTIAL/PLATELET  BASIC METABOLIC PANEL  PROTIME-INR  APTT  TYPE AND SCREEN   ____________________________________________  EKG  ED ECG REPORT I, Doran Stabler, the attending physician, personally viewed and interpreted this ECG.   Date: 05/02/2018  EKG Time: 0737  Rate: 74  Rhythm: normal sinus rhythm  Axis: Normal  Intervals:none  ST&T Change: No ST segment elevation or depression.  No abnormal T wave inversion.    ____________________________________________  RADIOLOGY   No acute findings on chest x-ray.  Possible right hilar opacity with adenopathy.  Right hip with oblique fracture with previous prosthesis in place.  No acute process in the CT of the head or  neck. ____________________________________________   PROCEDURES  Procedure(s) performed:   Procedures  Critical Care performed:   ____________________________________________   INITIAL IMPRESSION / ASSESSMENT AND PLAN / ED COURSE  Pertinent labs & imaging results that were available during my care of the patient were reviewed by me and considered in my medical decision making (see chart for details).  DDX: Hip fracture, hip contusion, intra-cranial hemorrhage, skull fracture, Coumadin coagulopathy As part of my medical decision making, I reviewed the following data within the [facility] Notes from prior ED visits  ----------------------------------------- 8:47 AM on 05/02/2018 -----------------------------------------  Discussed the case with Dr. Mack Guise who will be reviewing the images and evaluating with the patient will need transfer to another hospital or the procedure may be able to be done at [facility].  ----------------------------------------- 10:07 AM on 05/02/2018 -----------------------------------------  Discussed the case with Dr. Mack Guise who has reviewed the films and he recommends transfer secondary to competent surgery because  of previous prosthesis in place..  Discussed case with the [facility] transfer center and Dr. Nydia Bouton of the orthopedic service who accepts the patient onto his service.  Patient will be an ER to ER transfer but the transfer center representative, Verl Blalock, told me I do not need to speak to the ER attending.  Patient and family updated. ____________________________________________   FINAL CLINICAL IMPRESSION(S) / ED DIAGNOSES  Right-sided hip fracture.    NEW MEDICATIONS STARTED DURING THIS VISIT:  New Prescriptions   No medications on file     Note:  This document was prepared using Dragon voice recognition software and may include unintentional dictation errors.     Orbie Pyo,  MD 05/02/18 [phone]

## 2018-05-02 NOTE — ED Notes (Signed)
Pt taken by [place] ems to [facility].

## 2018-05-02 NOTE — ED Notes (Signed)
Report received from [facility] - pt is being transferred to [facility]. Report to [facility] and [facility]ala and med necessity previously completed.

## 2018-05-02 NOTE — ED Notes (Signed)
Pt to CT at this time.

## 2018-05-02 NOTE — ED Triage Notes (Addendum)
Pt presented today from home via [facility] for fall this morning. She was walking to bathroom when she slipped and fell. R leg is shortened and rotated. No LOC. Pt alert and oriented at this time. Pt arrived on backboard. Got 50 mcg Fentanyl with EMS. She does take Coumadin.

## 2018-05-06 DIAGNOSIS — R278 Other lack of coordination: Secondary | ICD-10-CM | POA: Diagnosis not present

## 2018-05-06 DIAGNOSIS — R509 Fever, unspecified: Secondary | ICD-10-CM | POA: Diagnosis not present

## 2018-05-06 DIAGNOSIS — M8580 Other specified disorders of bone density and structure, unspecified site: Secondary | ICD-10-CM | POA: Diagnosis not present

## 2018-05-06 DIAGNOSIS — D500 Iron deficiency anemia secondary to blood loss (chronic): Secondary | ICD-10-CM | POA: Diagnosis not present

## 2018-05-06 DIAGNOSIS — Y33XXXD Other specified events, undetermined intent, subsequent encounter: Secondary | ICD-10-CM | POA: Diagnosis not present

## 2018-05-06 DIAGNOSIS — M9701XD Periprosthetic fracture around internal prosthetic right hip joint, subsequent encounter: Secondary | ICD-10-CM | POA: Diagnosis not present

## 2018-05-06 DIAGNOSIS — M9701XA Periprosthetic fracture around internal prosthetic right hip joint, initial encounter: Secondary | ICD-10-CM | POA: Diagnosis not present

## 2018-05-06 DIAGNOSIS — I639 Cerebral infarction, unspecified: Secondary | ICD-10-CM | POA: Diagnosis not present

## 2018-05-06 DIAGNOSIS — G8911 Acute pain due to trauma: Secondary | ICD-10-CM | POA: Diagnosis not present

## 2018-05-06 DIAGNOSIS — M1611 Unilateral primary osteoarthritis, right hip: Secondary | ICD-10-CM | POA: Diagnosis not present

## 2018-05-06 DIAGNOSIS — S7291XD Unspecified fracture of right femur, subsequent encounter for closed fracture with routine healing: Secondary | ICD-10-CM | POA: Diagnosis not present

## 2018-05-06 DIAGNOSIS — Z96641 Presence of right artificial hip joint: Secondary | ICD-10-CM | POA: Diagnosis not present

## 2018-05-06 DIAGNOSIS — S7223XD Displaced subtrochanteric fracture of unspecified femur, subsequent encounter for closed fracture with routine healing: Secondary | ICD-10-CM | POA: Diagnosis not present

## 2018-05-06 DIAGNOSIS — M6250 Muscle wasting and atrophy, not elsewhere classified, unspecified site: Secondary | ICD-10-CM | POA: Diagnosis not present

## 2018-05-06 DIAGNOSIS — M85851 Other specified disorders of bone density and structure, right thigh: Secondary | ICD-10-CM | POA: Diagnosis not present

## 2018-05-06 DIAGNOSIS — S7291XA Unspecified fracture of right femur, initial encounter for closed fracture: Secondary | ICD-10-CM | POA: Diagnosis not present

## 2018-05-06 DIAGNOSIS — R531 Weakness: Secondary | ICD-10-CM | POA: Diagnosis not present

## 2018-05-06 DIAGNOSIS — S72001D Fracture of unspecified part of neck of right femur, subsequent encounter for closed fracture with routine healing: Secondary | ICD-10-CM | POA: Diagnosis not present

## 2018-05-06 DIAGNOSIS — I7090 Unspecified atherosclerosis: Secondary | ICD-10-CM | POA: Diagnosis not present

## 2018-05-06 DIAGNOSIS — I82409 Acute embolism and thrombosis of unspecified deep veins of unspecified lower extremity: Secondary | ICD-10-CM | POA: Diagnosis not present

## 2018-05-06 DIAGNOSIS — Z7901 Long term (current) use of anticoagulants: Secondary | ICD-10-CM | POA: Diagnosis not present

## 2018-05-06 DIAGNOSIS — R339 Retention of urine, unspecified: Secondary | ICD-10-CM | POA: Diagnosis not present

## 2018-05-06 DIAGNOSIS — I10 Essential (primary) hypertension: Secondary | ICD-10-CM | POA: Diagnosis not present

## 2018-05-06 DIAGNOSIS — Y33XXXA Other specified events, undetermined intent, initial encounter: Secondary | ICD-10-CM | POA: Diagnosis not present

## 2018-05-06 DIAGNOSIS — S72301D Unspecified fracture of shaft of right femur, subsequent encounter for closed fracture with routine healing: Secondary | ICD-10-CM | POA: Diagnosis not present

## 2018-05-06 DIAGNOSIS — Z4789 Encounter for other orthopedic aftercare: Secondary | ICD-10-CM | POA: Diagnosis not present

## 2018-05-06 DIAGNOSIS — S72001A Fracture of unspecified part of neck of right femur, initial encounter for closed fracture: Secondary | ICD-10-CM | POA: Diagnosis not present

## 2018-05-06 DIAGNOSIS — M978XXD Periprosthetic fracture around other internal prosthetic joint, subsequent encounter: Secondary | ICD-10-CM | POA: Diagnosis not present

## 2018-05-06 DIAGNOSIS — R262 Difficulty in walking, not elsewhere classified: Secondary | ICD-10-CM | POA: Diagnosis not present

## 2018-05-06 DIAGNOSIS — I6990 Unspecified sequelae of unspecified cerebrovascular disease: Secondary | ICD-10-CM | POA: Diagnosis not present

## 2018-05-12 DIAGNOSIS — R509 Fever, unspecified: Secondary | ICD-10-CM | POA: Diagnosis not present

## 2018-05-20 DIAGNOSIS — I7090 Unspecified atherosclerosis: Secondary | ICD-10-CM | POA: Diagnosis not present

## 2018-05-20 DIAGNOSIS — Y33XXXA Other specified events, undetermined intent, initial encounter: Secondary | ICD-10-CM | POA: Diagnosis not present

## 2018-05-20 DIAGNOSIS — M8580 Other specified disorders of bone density and structure, unspecified site: Secondary | ICD-10-CM | POA: Diagnosis not present

## 2018-05-20 DIAGNOSIS — S7291XA Unspecified fracture of right femur, initial encounter for closed fracture: Secondary | ICD-10-CM | POA: Diagnosis not present

## 2018-05-20 DIAGNOSIS — M85851 Other specified disorders of bone density and structure, right thigh: Secondary | ICD-10-CM | POA: Diagnosis not present

## 2018-05-20 DIAGNOSIS — Z96641 Presence of right artificial hip joint: Secondary | ICD-10-CM | POA: Diagnosis not present

## 2018-05-20 DIAGNOSIS — S7291XD Unspecified fracture of right femur, subsequent encounter for closed fracture with routine healing: Secondary | ICD-10-CM | POA: Diagnosis not present

## 2018-05-20 DIAGNOSIS — M978XXD Periprosthetic fracture around other internal prosthetic joint, subsequent encounter: Secondary | ICD-10-CM | POA: Diagnosis not present

## 2018-06-10 DIAGNOSIS — I6990 Unspecified sequelae of unspecified cerebrovascular disease: Secondary | ICD-10-CM | POA: Diagnosis not present

## 2018-06-10 DIAGNOSIS — M9701XD Periprosthetic fracture around internal prosthetic right hip joint, subsequent encounter: Secondary | ICD-10-CM | POA: Diagnosis not present

## 2018-06-10 DIAGNOSIS — Z4789 Encounter for other orthopedic aftercare: Secondary | ICD-10-CM | POA: Diagnosis not present

## 2018-06-10 DIAGNOSIS — I82409 Acute embolism and thrombosis of unspecified deep veins of unspecified lower extremity: Secondary | ICD-10-CM | POA: Diagnosis not present

## 2018-06-10 DIAGNOSIS — D500 Iron deficiency anemia secondary to blood loss (chronic): Secondary | ICD-10-CM | POA: Diagnosis not present

## 2018-06-26 DIAGNOSIS — Y33XXXD Other specified events, undetermined intent, subsequent encounter: Secondary | ICD-10-CM | POA: Diagnosis not present

## 2018-06-26 DIAGNOSIS — M9701XA Periprosthetic fracture around internal prosthetic right hip joint, initial encounter: Secondary | ICD-10-CM | POA: Diagnosis not present

## 2018-06-26 DIAGNOSIS — M1611 Unilateral primary osteoarthritis, right hip: Secondary | ICD-10-CM | POA: Diagnosis not present

## 2018-06-26 DIAGNOSIS — S7223XD Displaced subtrochanteric fracture of unspecified femur, subsequent encounter for closed fracture with routine healing: Secondary | ICD-10-CM | POA: Diagnosis not present

## 2018-06-26 DIAGNOSIS — S72301D Unspecified fracture of shaft of right femur, subsequent encounter for closed fracture with routine healing: Secondary | ICD-10-CM | POA: Diagnosis not present

## 2018-07-07 DIAGNOSIS — K635 Polyp of colon: Secondary | ICD-10-CM | POA: Diagnosis not present

## 2018-07-07 DIAGNOSIS — K210 Gastro-esophageal reflux disease with esophagitis: Secondary | ICD-10-CM | POA: Diagnosis not present

## 2018-07-07 DIAGNOSIS — I10 Essential (primary) hypertension: Secondary | ICD-10-CM | POA: Diagnosis not present

## 2018-07-07 DIAGNOSIS — S72001D Fracture of unspecified part of neck of right femur, subsequent encounter for closed fracture with routine healing: Secondary | ICD-10-CM | POA: Diagnosis not present

## 2018-07-07 DIAGNOSIS — H259 Unspecified age-related cataract: Secondary | ICD-10-CM | POA: Diagnosis not present

## 2018-07-08 DIAGNOSIS — K210 Gastro-esophageal reflux disease with esophagitis: Secondary | ICD-10-CM | POA: Diagnosis not present

## 2018-07-08 DIAGNOSIS — S72001D Fracture of unspecified part of neck of right femur, subsequent encounter for closed fracture with routine healing: Secondary | ICD-10-CM | POA: Diagnosis not present

## 2018-07-08 DIAGNOSIS — I10 Essential (primary) hypertension: Secondary | ICD-10-CM | POA: Diagnosis not present

## 2018-07-08 DIAGNOSIS — H259 Unspecified age-related cataract: Secondary | ICD-10-CM | POA: Diagnosis not present

## 2018-07-08 DIAGNOSIS — K635 Polyp of colon: Secondary | ICD-10-CM | POA: Diagnosis not present

## 2018-07-10 DIAGNOSIS — K210 Gastro-esophageal reflux disease with esophagitis: Secondary | ICD-10-CM | POA: Diagnosis not present

## 2018-07-10 DIAGNOSIS — S72001D Fracture of unspecified part of neck of right femur, subsequent encounter for closed fracture with routine healing: Secondary | ICD-10-CM | POA: Diagnosis not present

## 2018-07-10 DIAGNOSIS — K635 Polyp of colon: Secondary | ICD-10-CM | POA: Diagnosis not present

## 2018-07-10 DIAGNOSIS — H259 Unspecified age-related cataract: Secondary | ICD-10-CM | POA: Diagnosis not present

## 2018-07-10 DIAGNOSIS — I10 Essential (primary) hypertension: Secondary | ICD-10-CM | POA: Diagnosis not present

## 2018-07-14 DIAGNOSIS — K635 Polyp of colon: Secondary | ICD-10-CM | POA: Diagnosis not present

## 2018-07-14 DIAGNOSIS — S72001D Fracture of unspecified part of neck of right femur, subsequent encounter for closed fracture with routine healing: Secondary | ICD-10-CM | POA: Diagnosis not present

## 2018-07-14 DIAGNOSIS — K210 Gastro-esophageal reflux disease with esophagitis: Secondary | ICD-10-CM | POA: Diagnosis not present

## 2018-07-14 DIAGNOSIS — I10 Essential (primary) hypertension: Secondary | ICD-10-CM | POA: Diagnosis not present

## 2018-07-14 DIAGNOSIS — H259 Unspecified age-related cataract: Secondary | ICD-10-CM | POA: Diagnosis not present

## 2018-07-15 DIAGNOSIS — H259 Unspecified age-related cataract: Secondary | ICD-10-CM | POA: Diagnosis not present

## 2018-07-15 DIAGNOSIS — K210 Gastro-esophageal reflux disease with esophagitis: Secondary | ICD-10-CM | POA: Diagnosis not present

## 2018-07-15 DIAGNOSIS — I10 Essential (primary) hypertension: Secondary | ICD-10-CM | POA: Diagnosis not present

## 2018-07-15 DIAGNOSIS — K635 Polyp of colon: Secondary | ICD-10-CM | POA: Diagnosis not present

## 2018-07-15 DIAGNOSIS — S72001D Fracture of unspecified part of neck of right femur, subsequent encounter for closed fracture with routine healing: Secondary | ICD-10-CM | POA: Diagnosis not present

## 2018-07-16 DIAGNOSIS — S72001D Fracture of unspecified part of neck of right femur, subsequent encounter for closed fracture with routine healing: Secondary | ICD-10-CM | POA: Diagnosis not present

## 2018-07-16 DIAGNOSIS — I10 Essential (primary) hypertension: Secondary | ICD-10-CM | POA: Diagnosis not present

## 2018-07-16 DIAGNOSIS — H259 Unspecified age-related cataract: Secondary | ICD-10-CM | POA: Diagnosis not present

## 2018-07-16 DIAGNOSIS — K210 Gastro-esophageal reflux disease with esophagitis: Secondary | ICD-10-CM | POA: Diagnosis not present

## 2018-07-16 DIAGNOSIS — K635 Polyp of colon: Secondary | ICD-10-CM | POA: Diagnosis not present

## 2018-07-21 DIAGNOSIS — K210 Gastro-esophageal reflux disease with esophagitis: Secondary | ICD-10-CM | POA: Diagnosis not present

## 2018-07-21 DIAGNOSIS — K635 Polyp of colon: Secondary | ICD-10-CM | POA: Diagnosis not present

## 2018-07-21 DIAGNOSIS — S72001D Fracture of unspecified part of neck of right femur, subsequent encounter for closed fracture with routine healing: Secondary | ICD-10-CM | POA: Diagnosis not present

## 2018-07-21 DIAGNOSIS — I10 Essential (primary) hypertension: Secondary | ICD-10-CM | POA: Diagnosis not present

## 2018-07-21 DIAGNOSIS — H259 Unspecified age-related cataract: Secondary | ICD-10-CM | POA: Diagnosis not present

## 2018-07-23 DIAGNOSIS — H259 Unspecified age-related cataract: Secondary | ICD-10-CM | POA: Diagnosis not present

## 2018-07-23 DIAGNOSIS — I10 Essential (primary) hypertension: Secondary | ICD-10-CM | POA: Diagnosis not present

## 2018-07-23 DIAGNOSIS — S72001D Fracture of unspecified part of neck of right femur, subsequent encounter for closed fracture with routine healing: Secondary | ICD-10-CM | POA: Diagnosis not present

## 2018-07-23 DIAGNOSIS — K635 Polyp of colon: Secondary | ICD-10-CM | POA: Diagnosis not present

## 2018-07-23 DIAGNOSIS — K210 Gastro-esophageal reflux disease with esophagitis: Secondary | ICD-10-CM | POA: Diagnosis not present

## 2018-07-28 DIAGNOSIS — K635 Polyp of colon: Secondary | ICD-10-CM | POA: Diagnosis not present

## 2018-07-28 DIAGNOSIS — S72001D Fracture of unspecified part of neck of right femur, subsequent encounter for closed fracture with routine healing: Secondary | ICD-10-CM | POA: Diagnosis not present

## 2018-07-28 DIAGNOSIS — H259 Unspecified age-related cataract: Secondary | ICD-10-CM | POA: Diagnosis not present

## 2018-07-28 DIAGNOSIS — K210 Gastro-esophageal reflux disease with esophagitis: Secondary | ICD-10-CM | POA: Diagnosis not present

## 2018-07-28 DIAGNOSIS — I10 Essential (primary) hypertension: Secondary | ICD-10-CM | POA: Diagnosis not present

## 2018-07-29 DIAGNOSIS — H259 Unspecified age-related cataract: Secondary | ICD-10-CM | POA: Diagnosis not present

## 2018-07-29 DIAGNOSIS — K210 Gastro-esophageal reflux disease with esophagitis: Secondary | ICD-10-CM | POA: Diagnosis not present

## 2018-07-29 DIAGNOSIS — S72001D Fracture of unspecified part of neck of right femur, subsequent encounter for closed fracture with routine healing: Secondary | ICD-10-CM | POA: Diagnosis not present

## 2018-07-29 DIAGNOSIS — I10 Essential (primary) hypertension: Secondary | ICD-10-CM | POA: Diagnosis not present

## 2018-07-29 DIAGNOSIS — K635 Polyp of colon: Secondary | ICD-10-CM | POA: Diagnosis not present

## 2018-07-30 DIAGNOSIS — S72001D Fracture of unspecified part of neck of right femur, subsequent encounter for closed fracture with routine healing: Secondary | ICD-10-CM | POA: Diagnosis not present

## 2018-07-30 DIAGNOSIS — H259 Unspecified age-related cataract: Secondary | ICD-10-CM | POA: Diagnosis not present

## 2018-07-30 DIAGNOSIS — K210 Gastro-esophageal reflux disease with esophagitis: Secondary | ICD-10-CM | POA: Diagnosis not present

## 2018-07-30 DIAGNOSIS — I10 Essential (primary) hypertension: Secondary | ICD-10-CM | POA: Diagnosis not present

## 2018-07-30 DIAGNOSIS — K635 Polyp of colon: Secondary | ICD-10-CM | POA: Diagnosis not present

## 2018-08-04 DIAGNOSIS — I10 Essential (primary) hypertension: Secondary | ICD-10-CM | POA: Diagnosis not present

## 2018-08-04 DIAGNOSIS — H259 Unspecified age-related cataract: Secondary | ICD-10-CM | POA: Diagnosis not present

## 2018-08-04 DIAGNOSIS — K210 Gastro-esophageal reflux disease with esophagitis: Secondary | ICD-10-CM | POA: Diagnosis not present

## 2018-08-04 DIAGNOSIS — K635 Polyp of colon: Secondary | ICD-10-CM | POA: Diagnosis not present

## 2018-08-04 DIAGNOSIS — S72001D Fracture of unspecified part of neck of right femur, subsequent encounter for closed fracture with routine healing: Secondary | ICD-10-CM | POA: Diagnosis not present

## 2018-08-06 ENCOUNTER — Other Ambulatory Visit: Payer: Self-pay | Admitting: Family Medicine

## 2018-08-06 ENCOUNTER — Telehealth: Payer: Self-pay

## 2018-08-06 DIAGNOSIS — H259 Unspecified age-related cataract: Secondary | ICD-10-CM | POA: Diagnosis not present

## 2018-08-06 DIAGNOSIS — K210 Gastro-esophageal reflux disease with esophagitis: Secondary | ICD-10-CM | POA: Diagnosis not present

## 2018-08-06 DIAGNOSIS — S72001D Fracture of unspecified part of neck of right femur, subsequent encounter for closed fracture with routine healing: Secondary | ICD-10-CM | POA: Diagnosis not present

## 2018-08-06 DIAGNOSIS — I10 Essential (primary) hypertension: Secondary | ICD-10-CM | POA: Diagnosis not present

## 2018-08-06 DIAGNOSIS — K635 Polyp of colon: Secondary | ICD-10-CM | POA: Diagnosis not present

## 2018-08-06 NOTE — Telephone Encounter (Signed)
[facility] nurse called after checking INR on patient= 7.0. Patient was taking 2mg  on MWFSSUN and 1mg  on T and TH. Told to hold the med until seeing Dr Ronnald Ramp on Monday at 9:00 appt. Will recheck then. Also spoke to [facility] to find out why patient had not been in since June to have level drawn. She explained that [facility] has been checking her labs, though we do not recall any INR labs being forwarded to [place].

## 2018-08-10 ENCOUNTER — Encounter: Payer: Self-pay | Admitting: Family Medicine

## 2018-08-10 ENCOUNTER — Ambulatory Visit (INDEPENDENT_AMBULATORY_CARE_PROVIDER_SITE_OTHER): Payer: Medicare Other | Admitting: Family Medicine

## 2018-08-10 VITALS — BP 90/50 | HR 64 | Ht 66.0 in | Wt 170.0 lb

## 2018-08-10 DIAGNOSIS — R638 Other symptoms and signs concerning food and fluid intake: Secondary | ICD-10-CM

## 2018-08-10 DIAGNOSIS — L2084 Intrinsic (allergic) eczema: Secondary | ICD-10-CM | POA: Diagnosis not present

## 2018-08-10 DIAGNOSIS — R42 Dizziness and giddiness: Secondary | ICD-10-CM

## 2018-08-10 DIAGNOSIS — I482 Chronic atrial fibrillation, unspecified: Secondary | ICD-10-CM

## 2018-08-10 DIAGNOSIS — K219 Gastro-esophageal reflux disease without esophagitis: Secondary | ICD-10-CM | POA: Diagnosis not present

## 2018-08-10 DIAGNOSIS — Z7901 Long term (current) use of anticoagulants: Secondary | ICD-10-CM

## 2018-08-10 MED ORDER — RANITIDINE HCL 300 MG PO TABS: 300.0000 mg | ORAL_TABLET | Freq: Every day | ORAL | 5 refills | Status: DC

## 2018-08-10 NOTE — Progress Notes (Signed)
Date:  08/10/2018   Name:  Laurie Fuentes   DOB:  Aug 03, 1926   MRN:  [id number]   Chief Complaint: Follow-up (stop warfarin d/t INR 7.1- hasn't had any over the weekend. Needs recheck); Hypertension; Gastroesophageal Reflux; and hypokalemia Hypertension  This is a chronic problem. The current episode started more than 1 year ago. The problem has been waxing and waning since onset. The problem is controlled. Pertinent negatives include no anxiety, blurred vision, chest pain, headaches, malaise/fatigue, neck pain, orthopnea, palpitations, peripheral edema, PND, shortness of breath or sweats. There are no associated agents to hypertension. There are no known risk factors for coronary artery disease. Past treatments include calcium channel blockers and diuretics. The current treatment provides moderate improvement. There are no compliance problems.  There is no history of angina, kidney disease, CAD/MI, CVA, heart failure, left ventricular hypertrophy, PVD or retinopathy. There is no history of chronic renal disease or a hypertension causing med.  Gastroesophageal Reflux  She reports no abdominal pain, no belching, no chest pain, no choking, no coughing, no dysphagia, no early satiety, no globus sensation, no heartburn, no hoarse voice, no nausea, no sore throat, no stridor, no tooth decay, no water brash or no wheezing. This is a chronic problem. The current episode started more than 1 year ago. The symptoms are aggravated by certain foods. Pertinent negatives include no fatigue. She has tried a histamine-2 antagonist for the symptoms. The treatment provided moderate relief.     Review of Systems  Constitutional: Negative.  Negative for chills, fatigue, fever, malaise/fatigue and unexpected weight change.  HENT: Negative for congestion, ear discharge, ear pain, hoarse voice, rhinorrhea, sinus pressure, sneezing and sore throat.   Eyes: Negative for blurred vision, photophobia, pain, discharge,  redness and itching.  Respiratory: Negative for cough, choking, shortness of breath, wheezing and stridor.   Cardiovascular: Negative for chest pain, palpitations, orthopnea and PND.  Gastrointestinal: Negative for abdominal pain, blood in stool, constipation, diarrhea, dysphagia, heartburn, nausea and vomiting.  Endocrine: Negative for cold intolerance, heat intolerance, polydipsia, polyphagia and polyuria.  Genitourinary: Negative for dysuria, flank pain, frequency, hematuria, menstrual problem, pelvic pain, urgency, vaginal bleeding and vaginal discharge.  Musculoskeletal: Negative for arthralgias, back pain, myalgias and neck pain.  Skin: Negative for rash.  Allergic/Immunologic: Negative for environmental allergies and food allergies.  Neurological: Negative for dizziness, weakness, light-headedness, numbness and headaches.  Hematological: Negative for adenopathy. Does not bruise/bleed easily.  Psychiatric/Behavioral: Negative for dysphoric mood. The patient is not nervous/anxious.     Patient Active Problem List   Diagnosis Date Noted  . Rib contusion, left, subsequent encounter 08/08/2017  . Chronic atrial fibrillation 05/28/2016  . Hypokalemia 05/28/2016  . Anticoagulant long-term use 04/02/2016  . Hip fracture requiring operative repair ([place]) 01/07/2016  . Essential hypertension 11/29/2015  . Cerebral vascular disease 11/29/2015  . GERD (gastroesophageal reflux disease) 06/14/2015  . DVT of lower extremity (deep venous thrombosis) ([place]) 06/24/2014    No Known Allergies  Past Surgical History:  Procedure Laterality Date  . GALLBLADDER SURGERY    . HIP FRACTURE SURGERY Right     Social History   Tobacco Use  . Smoking status: Never Smoker  . Smokeless tobacco: Never Used  Substance Use Topics  . Alcohol use: No    Alcohol/week: 0.0 standard drinks  . Drug use: No     Medication list has been reviewed and updated.  Current Meds  Medication Sig  . Melatonin 3  MG TABS Take 1  tablet by mouth at bedtime.  . ranitidine (ZANTAC) 300 MG tablet Take 1 tablet (300 mg total) by mouth at bedtime.  . traMADol-acetaminophen (ULTRACET) 37.5-325 MG tablet Take 1 tablet by mouth 2 (two) times daily.  [place] warfarin (COUMADIN) 1 MG tablet TAKE ONE TABLET BY MOUTH ONCE DAILY USE  AS  DIRECTED  . warfarin (COUMADIN) 2 MG tablet TAKE ONE TABLET BY MOUTH ONCE DAILY  . [DISCONTINUED] amLODipine (NORVASC) 5 MG tablet TAKE 1 TABLET BY MOUTH ONCE DAILY  . [DISCONTINUED] hydrochlorothiazide (HYDRODIURIL) 25 MG tablet Take 1 tablet (25 mg total) by mouth daily.  . [DISCONTINUED] KLOR-CON M20 20 MEQ tablet TAKE 1 TABLET BY MOUTH ONCE DAILY  . [DISCONTINUED] ranitidine (ZANTAC) 300 MG tablet Take 1 tablet (300 mg total) by mouth at bedtime.   Current Facility-Administered Medications for the 08/10/18 encounter (Office Visit) with Juline Patch, MD  Medication  . carbamide peroxide (DEBROX) 6.5 % otic solution 5 drop    PHQ 2/9 Scores 08/10/2018 05/13/2017 04/02/2016 11/29/2015  PHQ - 2 Score 0 0 0 0    Physical Exam  Constitutional: She is oriented to person, place, and time. She appears well-developed and well-nourished.  HENT:  Head: Normocephalic.  Right Ear: External ear normal.  Left Ear: External ear normal.  Mouth/Throat: Oropharynx is clear and moist.  Eyes: Pupils are equal, round, and reactive to light. Conjunctivae and EOM are normal. Lids are everted and swept, no foreign bodies found. Left eye exhibits no hordeolum. No foreign body present in the left eye. Right conjunctiva is not injected. Left conjunctiva is not injected. No scleral icterus.  Neck: Normal range of motion. Neck supple. No JVD present. No tracheal deviation present. No thyromegaly present.  Cardiovascular: Normal rate, regular rhythm, normal heart sounds and intact distal pulses. Exam reveals no gallop and no friction rub.  No murmur heard. Pulmonary/Chest: Effort normal and breath sounds normal.  No respiratory distress. She has no wheezes. She has no rales.  Abdominal: Soft. Bowel sounds are normal. She exhibits no mass. There is no hepatosplenomegaly. There is no tenderness. There is no rebound and no guarding.  Musculoskeletal: Normal range of motion. She exhibits no edema or tenderness.  Lymphadenopathy:    She has no cervical adenopathy.  Neurological: She is alert and oriented to person, place, and time. She has normal strength. She displays normal reflexes. No cranial nerve deficit.  Skin: Skin is warm. No rash noted.  Psychiatric: She has a normal mood and affect. Her mood appears not anxious. She does not exhibit a depressed mood.  Nursing note and vitals reviewed.   BP (!) 90/50   Pulse 64   Ht 5\' 6"  (1.676 m)   Wt 170 lb (77.1 kg)   BMI 27.44 kg/m   Assessment and Plan:  1. Anticoagulant long-term use Redraw INR - INR/PT  2. Gastroesophageal reflux disease, esophagitis presence not specified Stable on meds- refill Ranitidine - ranitidine (ZANTAC) 300 MG tablet; Take 1 tablet (300 mg total) by mouth at bedtime.  Dispense: 30 tablet; Refill: 5  3. Chronic atrial fibrillation Draw PT/INR  4. Decreased oral intake Advised to try drinking boost shakes or eating jello/ something easy to swallow  5. Orthostatic dizziness Stopped Amlodipine and encouraged fluid intake  6. Intrinsic eczema Discussed next step dermatology for further evaluation   Dr. Otilio Miu [facility]  08/10/2018

## 2018-08-11 DIAGNOSIS — I10 Essential (primary) hypertension: Secondary | ICD-10-CM | POA: Diagnosis not present

## 2018-08-11 DIAGNOSIS — K635 Polyp of colon: Secondary | ICD-10-CM | POA: Diagnosis not present

## 2018-08-11 DIAGNOSIS — H259 Unspecified age-related cataract: Secondary | ICD-10-CM | POA: Diagnosis not present

## 2018-08-11 DIAGNOSIS — S72001D Fracture of unspecified part of neck of right femur, subsequent encounter for closed fracture with routine healing: Secondary | ICD-10-CM | POA: Diagnosis not present

## 2018-08-11 DIAGNOSIS — K210 Gastro-esophageal reflux disease with esophagitis: Secondary | ICD-10-CM | POA: Diagnosis not present

## 2018-08-11 LAB — PROTIME-INR
INR: 1.8 — ABNORMAL HIGH (ref 0.8–1.2)
PROTHROMBIN TIME: 17.9 s — AB (ref 9.1–12.0)

## 2018-08-12 ENCOUNTER — Telehealth: Payer: Self-pay

## 2018-08-12 DIAGNOSIS — K635 Polyp of colon: Secondary | ICD-10-CM | POA: Diagnosis not present

## 2018-08-12 DIAGNOSIS — I10 Essential (primary) hypertension: Secondary | ICD-10-CM | POA: Diagnosis not present

## 2018-08-12 DIAGNOSIS — K210 Gastro-esophageal reflux disease with esophagitis: Secondary | ICD-10-CM | POA: Diagnosis not present

## 2018-08-12 DIAGNOSIS — S72001D Fracture of unspecified part of neck of right femur, subsequent encounter for closed fracture with routine healing: Secondary | ICD-10-CM | POA: Diagnosis not present

## 2018-08-12 DIAGNOSIS — H259 Unspecified age-related cataract: Secondary | ICD-10-CM | POA: Diagnosis not present

## 2018-08-12 NOTE — Telephone Encounter (Signed)
Rcvd message from daughter that Laurie Fuentes needs her warfarin and her Tramadol. Per Ronnald Ramp we can fil Warfarin but not Tramadol that is done by Ortho. I tried to call both numbers but no VM and no answer. Need to know which warfarin or both? What pharmacy?

## 2018-08-13 ENCOUNTER — Other Ambulatory Visit: Payer: Self-pay

## 2018-08-13 DIAGNOSIS — K210 Gastro-esophageal reflux disease with esophagitis: Secondary | ICD-10-CM | POA: Diagnosis not present

## 2018-08-13 DIAGNOSIS — I82409 Acute embolism and thrombosis of unspecified deep veins of unspecified lower extremity: Secondary | ICD-10-CM

## 2018-08-13 DIAGNOSIS — I10 Essential (primary) hypertension: Secondary | ICD-10-CM | POA: Diagnosis not present

## 2018-08-13 DIAGNOSIS — S72001D Fracture of unspecified part of neck of right femur, subsequent encounter for closed fracture with routine healing: Secondary | ICD-10-CM | POA: Diagnosis not present

## 2018-08-13 DIAGNOSIS — Z7901 Long term (current) use of anticoagulants: Secondary | ICD-10-CM

## 2018-08-13 DIAGNOSIS — K635 Polyp of colon: Secondary | ICD-10-CM | POA: Diagnosis not present

## 2018-08-13 DIAGNOSIS — H259 Unspecified age-related cataract: Secondary | ICD-10-CM | POA: Diagnosis not present

## 2018-08-13 MED ORDER — WARFARIN SODIUM 1 MG PO TABS: ORAL_TABLET | ORAL | 1 refills | Status: DC

## 2018-08-13 MED ORDER — WARFARIN SODIUM 2 MG PO TABS: 2.0000 mg | ORAL_TABLET | Freq: Every day | ORAL | 1 refills | Status: DC

## 2018-08-17 DIAGNOSIS — H259 Unspecified age-related cataract: Secondary | ICD-10-CM | POA: Diagnosis not present

## 2018-08-17 DIAGNOSIS — I10 Essential (primary) hypertension: Secondary | ICD-10-CM | POA: Diagnosis not present

## 2018-08-17 DIAGNOSIS — S72001D Fracture of unspecified part of neck of right femur, subsequent encounter for closed fracture with routine healing: Secondary | ICD-10-CM | POA: Diagnosis not present

## 2018-08-17 DIAGNOSIS — K635 Polyp of colon: Secondary | ICD-10-CM | POA: Diagnosis not present

## 2018-08-17 DIAGNOSIS — K210 Gastro-esophageal reflux disease with esophagitis: Secondary | ICD-10-CM | POA: Diagnosis not present

## 2018-08-19 DIAGNOSIS — I10 Essential (primary) hypertension: Secondary | ICD-10-CM | POA: Diagnosis not present

## 2018-08-19 DIAGNOSIS — S72001D Fracture of unspecified part of neck of right femur, subsequent encounter for closed fracture with routine healing: Secondary | ICD-10-CM | POA: Diagnosis not present

## 2018-08-19 DIAGNOSIS — K210 Gastro-esophageal reflux disease with esophagitis: Secondary | ICD-10-CM | POA: Diagnosis not present

## 2018-08-19 DIAGNOSIS — H259 Unspecified age-related cataract: Secondary | ICD-10-CM | POA: Diagnosis not present

## 2018-08-19 DIAGNOSIS — K635 Polyp of colon: Secondary | ICD-10-CM | POA: Diagnosis not present

## 2018-08-20 DIAGNOSIS — K635 Polyp of colon: Secondary | ICD-10-CM | POA: Diagnosis not present

## 2018-08-20 DIAGNOSIS — K210 Gastro-esophageal reflux disease with esophagitis: Secondary | ICD-10-CM | POA: Diagnosis not present

## 2018-08-20 DIAGNOSIS — H259 Unspecified age-related cataract: Secondary | ICD-10-CM | POA: Diagnosis not present

## 2018-08-20 DIAGNOSIS — I10 Essential (primary) hypertension: Secondary | ICD-10-CM | POA: Diagnosis not present

## 2018-08-20 DIAGNOSIS — S72001D Fracture of unspecified part of neck of right femur, subsequent encounter for closed fracture with routine healing: Secondary | ICD-10-CM | POA: Diagnosis not present

## 2018-08-24 ENCOUNTER — Ambulatory Visit (INDEPENDENT_AMBULATORY_CARE_PROVIDER_SITE_OTHER): Payer: Medicare Other | Admitting: Family Medicine

## 2018-08-24 ENCOUNTER — Encounter: Payer: Self-pay | Admitting: Family Medicine

## 2018-08-24 ENCOUNTER — Telehealth: Payer: Self-pay

## 2018-08-24 VITALS — BP 110/58 | HR 64 | Ht 66.0 in | Wt 170.0 lb

## 2018-08-24 DIAGNOSIS — K219 Gastro-esophageal reflux disease without esophagitis: Secondary | ICD-10-CM

## 2018-08-24 DIAGNOSIS — Z7901 Long term (current) use of anticoagulants: Secondary | ICD-10-CM | POA: Diagnosis not present

## 2018-08-24 DIAGNOSIS — I482 Chronic atrial fibrillation, unspecified: Secondary | ICD-10-CM

## 2018-08-24 NOTE — Telephone Encounter (Signed)
Laurie Fuentes from [facility] returned my call concerning flu shot- it was "Not given while patient was there"- I notified [name] (granddaughter)

## 2018-08-24 NOTE — Progress Notes (Signed)
Date:  08/24/2018   Name:  Laurie Fuentes   DOB:  February 20, 1926   MRN:  [id number]   Chief Complaint: Hypertension (recheck B/P and labs/INR) Hypertension  This is a chronic problem. The current episode started more than 1 year ago. The problem has been waxing and waning since onset. The problem is controlled. Pertinent negatives include no anxiety, blurred vision, chest pain, headaches, malaise/fatigue, neck pain, orthopnea, palpitations, peripheral edema, PND, shortness of breath or sweats. There are no associated agents to hypertension. There are no known risk factors for coronary artery disease. Past treatments include lifestyle changes (sodium observed). The current treatment provides mild improvement. There are no compliance problems.  There is no history of angina, kidney disease, CAD/MI, CVA, heart failure, left ventricular hypertrophy, PVD or retinopathy.     Review of Systems  Constitutional: Negative.  Negative for chills, fatigue, fever, malaise/fatigue and unexpected weight change.  HENT: Negative for congestion, ear discharge, ear pain, rhinorrhea, sinus pressure, sneezing and sore throat.   Eyes: Negative for blurred vision, photophobia, pain, discharge, redness and itching.  Respiratory: Negative for cough, shortness of breath, wheezing and stridor.   Cardiovascular: Negative for chest pain, palpitations, orthopnea and PND.  Gastrointestinal: Negative for abdominal pain, blood in stool, constipation, diarrhea, nausea and vomiting.  Endocrine: Negative for cold intolerance, heat intolerance, polydipsia, polyphagia and polyuria.  Genitourinary: Negative for dysuria, flank pain, frequency, hematuria, menstrual problem, pelvic pain, urgency, vaginal bleeding and vaginal discharge.  Musculoskeletal: Negative for arthralgias, back pain, myalgias and neck pain.  Skin: Negative for rash.  Allergic/Immunologic: Negative for environmental allergies and food allergies.  Neurological:  Negative for dizziness, weakness, light-headedness, numbness and headaches.  Hematological: Negative for adenopathy. Does not bruise/bleed easily.  Psychiatric/Behavioral: Negative for dysphoric mood. The patient is not nervous/anxious.     Patient Active Problem List   Diagnosis Date Noted  . Rib contusion, left, subsequent encounter 08/08/2017  . Chronic atrial fibrillation 05/28/2016  . Hypokalemia 05/28/2016  . Anticoagulant long-term use 04/02/2016  . Hip fracture requiring operative repair ([place]) 01/07/2016  . Essential hypertension 11/29/2015  . Cerebral vascular disease 11/29/2015  . GERD (gastroesophageal reflux disease) 06/14/2015  . DVT of lower extremity (deep venous thrombosis) ([place]) 06/24/2014    No Known Allergies  Past Surgical History:  Procedure Laterality Date  . GALLBLADDER SURGERY    . HIP FRACTURE SURGERY Right     Social History   Tobacco Use  . Smoking status: Never Smoker  . Smokeless tobacco: Never Used  Substance Use Topics  . Alcohol use: No    Alcohol/week: 0.0 standard drinks  . Drug use: No     Medication list has been reviewed and updated.  No outpatient medications have been marked as taking for the 08/24/18 encounter (Office Visit) with Juline Patch, MD.   Current Facility-Administered Medications for the 08/24/18 encounter (Office Visit) with Juline Patch, MD  Medication  . carbamide peroxide (DEBROX) 6.5 % otic solution 5 drop    PHQ 2/9 Scores 08/10/2018 05/13/2017 04/02/2016 11/29/2015  PHQ - 2 Score 0 0 0 0    Physical Exam  Constitutional: She is oriented to person, place, and time. She appears well-developed and well-nourished.  HENT:  Head: Normocephalic.  Right Ear: External ear normal.  Left Ear: External ear normal.  Mouth/Throat: Oropharynx is clear and moist.  Eyes: Pupils are equal, round, and reactive to light. Conjunctivae and EOM are normal. Lids are everted and swept, no foreign bodies  found. Left eye  exhibits no hordeolum. No foreign body present in the left eye. Right conjunctiva is not injected. Left conjunctiva is not injected. No scleral icterus.  Neck: Normal range of motion. Neck supple. No JVD present. No tracheal deviation present. No thyromegaly present.  Cardiovascular: Normal rate, regular rhythm, normal heart sounds and intact distal pulses. Exam reveals no gallop and no friction rub.  No murmur heard. Pulmonary/Chest: Effort normal and breath sounds normal. No respiratory distress. She has no wheezes. She has no rales.  Abdominal: Soft. Bowel sounds are normal. She exhibits no mass. There is no hepatosplenomegaly. There is no tenderness. There is no rebound and no guarding.  Musculoskeletal: Normal range of motion. She exhibits no edema or tenderness.  Lymphadenopathy:    She has no cervical adenopathy.  Neurological: She is alert and oriented to person, place, and time. She has normal strength. She displays normal reflexes. No cranial nerve deficit.  Skin: Skin is warm. No rash noted.  Psychiatric: She has a normal mood and affect. Her mood appears not anxious. She does not exhibit a depressed mood.  Nursing note and vitals reviewed.   BP (!) 110/58   Pulse 64   Ht 5\' 6"  (1.676 m)   Wt 170 lb (77.1 kg)   BMI 27.44 kg/m   Assessment and Plan:  1. Chronic atrial fibrillation Recheck INR - Protime-INR  2. Anticoagulant long-term use Draw INR - Protime-INR  3. Gastroesophageal reflux disease, esophagitis presence not specified Stable on med   Dr. Otilio Miu [facility]  08/24/2018

## 2018-08-25 DIAGNOSIS — H259 Unspecified age-related cataract: Secondary | ICD-10-CM | POA: Diagnosis not present

## 2018-08-25 DIAGNOSIS — S72001D Fracture of unspecified part of neck of right femur, subsequent encounter for closed fracture with routine healing: Secondary | ICD-10-CM | POA: Diagnosis not present

## 2018-08-25 DIAGNOSIS — I10 Essential (primary) hypertension: Secondary | ICD-10-CM | POA: Diagnosis not present

## 2018-08-25 DIAGNOSIS — K210 Gastro-esophageal reflux disease with esophagitis: Secondary | ICD-10-CM | POA: Diagnosis not present

## 2018-08-25 DIAGNOSIS — K635 Polyp of colon: Secondary | ICD-10-CM | POA: Diagnosis not present

## 2018-08-25 LAB — PROTIME-INR
INR: 1.2 (ref 0.8–1.2)
Prothrombin Time: 12.6 s — ABNORMAL HIGH (ref 9.1–12.0)

## 2018-08-26 DIAGNOSIS — K210 Gastro-esophageal reflux disease with esophagitis: Secondary | ICD-10-CM | POA: Diagnosis not present

## 2018-08-26 DIAGNOSIS — H259 Unspecified age-related cataract: Secondary | ICD-10-CM | POA: Diagnosis not present

## 2018-08-26 DIAGNOSIS — S72001D Fracture of unspecified part of neck of right femur, subsequent encounter for closed fracture with routine healing: Secondary | ICD-10-CM | POA: Diagnosis not present

## 2018-08-26 DIAGNOSIS — I10 Essential (primary) hypertension: Secondary | ICD-10-CM | POA: Diagnosis not present

## 2018-08-26 DIAGNOSIS — K635 Polyp of colon: Secondary | ICD-10-CM | POA: Diagnosis not present

## 2018-08-27 DIAGNOSIS — K635 Polyp of colon: Secondary | ICD-10-CM | POA: Diagnosis not present

## 2018-08-27 DIAGNOSIS — H259 Unspecified age-related cataract: Secondary | ICD-10-CM | POA: Diagnosis not present

## 2018-08-27 DIAGNOSIS — S72001D Fracture of unspecified part of neck of right femur, subsequent encounter for closed fracture with routine healing: Secondary | ICD-10-CM | POA: Diagnosis not present

## 2018-08-27 DIAGNOSIS — K210 Gastro-esophageal reflux disease with esophagitis: Secondary | ICD-10-CM | POA: Diagnosis not present

## 2018-08-27 DIAGNOSIS — I10 Essential (primary) hypertension: Secondary | ICD-10-CM | POA: Diagnosis not present

## 2018-09-01 DIAGNOSIS — S72001D Fracture of unspecified part of neck of right femur, subsequent encounter for closed fracture with routine healing: Secondary | ICD-10-CM | POA: Diagnosis not present

## 2018-09-01 DIAGNOSIS — K635 Polyp of colon: Secondary | ICD-10-CM | POA: Diagnosis not present

## 2018-09-01 DIAGNOSIS — H259 Unspecified age-related cataract: Secondary | ICD-10-CM | POA: Diagnosis not present

## 2018-09-01 DIAGNOSIS — K210 Gastro-esophageal reflux disease with esophagitis: Secondary | ICD-10-CM | POA: Diagnosis not present

## 2018-09-01 DIAGNOSIS — I10 Essential (primary) hypertension: Secondary | ICD-10-CM | POA: Diagnosis not present

## 2018-09-03 DIAGNOSIS — I10 Essential (primary) hypertension: Secondary | ICD-10-CM | POA: Diagnosis not present

## 2018-09-03 DIAGNOSIS — H259 Unspecified age-related cataract: Secondary | ICD-10-CM | POA: Diagnosis not present

## 2018-09-03 DIAGNOSIS — K210 Gastro-esophageal reflux disease with esophagitis: Secondary | ICD-10-CM | POA: Diagnosis not present

## 2018-09-03 DIAGNOSIS — S72001D Fracture of unspecified part of neck of right femur, subsequent encounter for closed fracture with routine healing: Secondary | ICD-10-CM | POA: Diagnosis not present

## 2018-09-03 DIAGNOSIS — K635 Polyp of colon: Secondary | ICD-10-CM | POA: Diagnosis not present

## 2018-09-05 DIAGNOSIS — Z7901 Long term (current) use of anticoagulants: Secondary | ICD-10-CM | POA: Diagnosis not present

## 2018-09-05 DIAGNOSIS — Z86718 Personal history of other venous thrombosis and embolism: Secondary | ICD-10-CM | POA: Diagnosis not present

## 2018-09-05 DIAGNOSIS — H259 Unspecified age-related cataract: Secondary | ICD-10-CM | POA: Diagnosis not present

## 2018-09-05 DIAGNOSIS — K635 Polyp of colon: Secondary | ICD-10-CM | POA: Diagnosis not present

## 2018-09-05 DIAGNOSIS — K210 Gastro-esophageal reflux disease with esophagitis: Secondary | ICD-10-CM | POA: Diagnosis not present

## 2018-09-05 DIAGNOSIS — Z8673 Personal history of transient ischemic attack (TIA), and cerebral infarction without residual deficits: Secondary | ICD-10-CM | POA: Diagnosis not present

## 2018-09-05 DIAGNOSIS — M9701XD Periprosthetic fracture around internal prosthetic right hip joint, subsequent encounter: Secondary | ICD-10-CM | POA: Diagnosis not present

## 2018-09-05 DIAGNOSIS — I10 Essential (primary) hypertension: Secondary | ICD-10-CM | POA: Diagnosis not present

## 2018-09-07 ENCOUNTER — Ambulatory Visit (INDEPENDENT_AMBULATORY_CARE_PROVIDER_SITE_OTHER): Payer: Medicare Other

## 2018-09-07 DIAGNOSIS — Z23 Encounter for immunization: Secondary | ICD-10-CM

## 2018-09-07 DIAGNOSIS — Z7901 Long term (current) use of anticoagulants: Secondary | ICD-10-CM | POA: Diagnosis not present

## 2018-09-08 DIAGNOSIS — Z86718 Personal history of other venous thrombosis and embolism: Secondary | ICD-10-CM | POA: Diagnosis not present

## 2018-09-08 DIAGNOSIS — K210 Gastro-esophageal reflux disease with esophagitis: Secondary | ICD-10-CM | POA: Diagnosis not present

## 2018-09-08 DIAGNOSIS — I10 Essential (primary) hypertension: Secondary | ICD-10-CM | POA: Diagnosis not present

## 2018-09-08 DIAGNOSIS — K635 Polyp of colon: Secondary | ICD-10-CM | POA: Diagnosis not present

## 2018-09-08 DIAGNOSIS — H259 Unspecified age-related cataract: Secondary | ICD-10-CM | POA: Diagnosis not present

## 2018-09-08 DIAGNOSIS — M9701XD Periprosthetic fracture around internal prosthetic right hip joint, subsequent encounter: Secondary | ICD-10-CM | POA: Diagnosis not present

## 2018-09-08 LAB — PROTIME-INR
INR: 1.7 — AB (ref 0.8–1.2)
Prothrombin Time: 16.8 s — ABNORMAL HIGH (ref 9.1–12.0)

## 2018-09-10 DIAGNOSIS — I10 Essential (primary) hypertension: Secondary | ICD-10-CM | POA: Diagnosis not present

## 2018-09-10 DIAGNOSIS — H259 Unspecified age-related cataract: Secondary | ICD-10-CM | POA: Diagnosis not present

## 2018-09-10 DIAGNOSIS — K635 Polyp of colon: Secondary | ICD-10-CM | POA: Diagnosis not present

## 2018-09-10 DIAGNOSIS — K210 Gastro-esophageal reflux disease with esophagitis: Secondary | ICD-10-CM | POA: Diagnosis not present

## 2018-09-10 DIAGNOSIS — Z86718 Personal history of other venous thrombosis and embolism: Secondary | ICD-10-CM | POA: Diagnosis not present

## 2018-09-10 DIAGNOSIS — M9701XD Periprosthetic fracture around internal prosthetic right hip joint, subsequent encounter: Secondary | ICD-10-CM | POA: Diagnosis not present

## 2018-09-14 DIAGNOSIS — H259 Unspecified age-related cataract: Secondary | ICD-10-CM | POA: Diagnosis not present

## 2018-09-14 DIAGNOSIS — K635 Polyp of colon: Secondary | ICD-10-CM | POA: Diagnosis not present

## 2018-09-14 DIAGNOSIS — I10 Essential (primary) hypertension: Secondary | ICD-10-CM | POA: Diagnosis not present

## 2018-09-14 DIAGNOSIS — K210 Gastro-esophageal reflux disease with esophagitis: Secondary | ICD-10-CM | POA: Diagnosis not present

## 2018-09-14 DIAGNOSIS — Z86718 Personal history of other venous thrombosis and embolism: Secondary | ICD-10-CM | POA: Diagnosis not present

## 2018-09-14 DIAGNOSIS — M9701XD Periprosthetic fracture around internal prosthetic right hip joint, subsequent encounter: Secondary | ICD-10-CM | POA: Diagnosis not present

## 2018-09-17 DIAGNOSIS — K210 Gastro-esophageal reflux disease with esophagitis: Secondary | ICD-10-CM | POA: Diagnosis not present

## 2018-09-17 DIAGNOSIS — Z86718 Personal history of other venous thrombosis and embolism: Secondary | ICD-10-CM | POA: Diagnosis not present

## 2018-09-17 DIAGNOSIS — K635 Polyp of colon: Secondary | ICD-10-CM | POA: Diagnosis not present

## 2018-09-17 DIAGNOSIS — I10 Essential (primary) hypertension: Secondary | ICD-10-CM | POA: Diagnosis not present

## 2018-09-17 DIAGNOSIS — H259 Unspecified age-related cataract: Secondary | ICD-10-CM | POA: Diagnosis not present

## 2018-09-17 DIAGNOSIS — M9701XD Periprosthetic fracture around internal prosthetic right hip joint, subsequent encounter: Secondary | ICD-10-CM | POA: Diagnosis not present

## 2018-09-22 DIAGNOSIS — M9701XD Periprosthetic fracture around internal prosthetic right hip joint, subsequent encounter: Secondary | ICD-10-CM | POA: Diagnosis not present

## 2018-09-22 DIAGNOSIS — K635 Polyp of colon: Secondary | ICD-10-CM | POA: Diagnosis not present

## 2018-09-22 DIAGNOSIS — Z86718 Personal history of other venous thrombosis and embolism: Secondary | ICD-10-CM | POA: Diagnosis not present

## 2018-09-22 DIAGNOSIS — K210 Gastro-esophageal reflux disease with esophagitis: Secondary | ICD-10-CM | POA: Diagnosis not present

## 2018-09-22 DIAGNOSIS — H259 Unspecified age-related cataract: Secondary | ICD-10-CM | POA: Diagnosis not present

## 2018-09-22 DIAGNOSIS — I10 Essential (primary) hypertension: Secondary | ICD-10-CM | POA: Diagnosis not present

## 2018-09-24 DIAGNOSIS — H259 Unspecified age-related cataract: Secondary | ICD-10-CM | POA: Diagnosis not present

## 2018-09-24 DIAGNOSIS — M9701XD Periprosthetic fracture around internal prosthetic right hip joint, subsequent encounter: Secondary | ICD-10-CM | POA: Diagnosis not present

## 2018-09-24 DIAGNOSIS — Z86718 Personal history of other venous thrombosis and embolism: Secondary | ICD-10-CM | POA: Diagnosis not present

## 2018-09-24 DIAGNOSIS — K210 Gastro-esophageal reflux disease with esophagitis: Secondary | ICD-10-CM | POA: Diagnosis not present

## 2018-09-24 DIAGNOSIS — I10 Essential (primary) hypertension: Secondary | ICD-10-CM | POA: Diagnosis not present

## 2018-09-24 DIAGNOSIS — K635 Polyp of colon: Secondary | ICD-10-CM | POA: Diagnosis not present

## 2018-09-29 DIAGNOSIS — M9701XD Periprosthetic fracture around internal prosthetic right hip joint, subsequent encounter: Secondary | ICD-10-CM | POA: Diagnosis not present

## 2018-09-29 DIAGNOSIS — Z86718 Personal history of other venous thrombosis and embolism: Secondary | ICD-10-CM | POA: Diagnosis not present

## 2018-09-29 DIAGNOSIS — H259 Unspecified age-related cataract: Secondary | ICD-10-CM | POA: Diagnosis not present

## 2018-09-29 DIAGNOSIS — I10 Essential (primary) hypertension: Secondary | ICD-10-CM | POA: Diagnosis not present

## 2018-09-29 DIAGNOSIS — K210 Gastro-esophageal reflux disease with esophagitis: Secondary | ICD-10-CM | POA: Diagnosis not present

## 2018-09-29 DIAGNOSIS — K635 Polyp of colon: Secondary | ICD-10-CM | POA: Diagnosis not present

## 2018-10-02 DIAGNOSIS — I10 Essential (primary) hypertension: Secondary | ICD-10-CM | POA: Diagnosis not present

## 2018-10-02 DIAGNOSIS — K210 Gastro-esophageal reflux disease with esophagitis: Secondary | ICD-10-CM | POA: Diagnosis not present

## 2018-10-02 DIAGNOSIS — K635 Polyp of colon: Secondary | ICD-10-CM | POA: Diagnosis not present

## 2018-10-02 DIAGNOSIS — H259 Unspecified age-related cataract: Secondary | ICD-10-CM | POA: Diagnosis not present

## 2018-10-02 DIAGNOSIS — Z86718 Personal history of other venous thrombosis and embolism: Secondary | ICD-10-CM | POA: Diagnosis not present

## 2018-10-02 DIAGNOSIS — M9701XD Periprosthetic fracture around internal prosthetic right hip joint, subsequent encounter: Secondary | ICD-10-CM | POA: Diagnosis not present

## 2018-10-06 DIAGNOSIS — I10 Essential (primary) hypertension: Secondary | ICD-10-CM | POA: Diagnosis not present

## 2018-10-06 DIAGNOSIS — K635 Polyp of colon: Secondary | ICD-10-CM | POA: Diagnosis not present

## 2018-10-06 DIAGNOSIS — M9701XD Periprosthetic fracture around internal prosthetic right hip joint, subsequent encounter: Secondary | ICD-10-CM | POA: Diagnosis not present

## 2018-10-06 DIAGNOSIS — Z86718 Personal history of other venous thrombosis and embolism: Secondary | ICD-10-CM | POA: Diagnosis not present

## 2018-10-06 DIAGNOSIS — H259 Unspecified age-related cataract: Secondary | ICD-10-CM | POA: Diagnosis not present

## 2018-10-06 DIAGNOSIS — K210 Gastro-esophageal reflux disease with esophagitis: Secondary | ICD-10-CM | POA: Diagnosis not present

## 2018-10-08 DIAGNOSIS — Z86718 Personal history of other venous thrombosis and embolism: Secondary | ICD-10-CM | POA: Diagnosis not present

## 2018-10-08 DIAGNOSIS — K210 Gastro-esophageal reflux disease with esophagitis: Secondary | ICD-10-CM | POA: Diagnosis not present

## 2018-10-08 DIAGNOSIS — H259 Unspecified age-related cataract: Secondary | ICD-10-CM | POA: Diagnosis not present

## 2018-10-08 DIAGNOSIS — M9701XD Periprosthetic fracture around internal prosthetic right hip joint, subsequent encounter: Secondary | ICD-10-CM | POA: Diagnosis not present

## 2018-10-08 DIAGNOSIS — I10 Essential (primary) hypertension: Secondary | ICD-10-CM | POA: Diagnosis not present

## 2018-10-08 DIAGNOSIS — K635 Polyp of colon: Secondary | ICD-10-CM | POA: Diagnosis not present

## 2018-10-13 DIAGNOSIS — Z86718 Personal history of other venous thrombosis and embolism: Secondary | ICD-10-CM | POA: Diagnosis not present

## 2018-10-13 DIAGNOSIS — M9701XD Periprosthetic fracture around internal prosthetic right hip joint, subsequent encounter: Secondary | ICD-10-CM | POA: Diagnosis not present

## 2018-10-13 DIAGNOSIS — K210 Gastro-esophageal reflux disease with esophagitis: Secondary | ICD-10-CM | POA: Diagnosis not present

## 2018-10-13 DIAGNOSIS — K635 Polyp of colon: Secondary | ICD-10-CM | POA: Diagnosis not present

## 2018-10-13 DIAGNOSIS — H259 Unspecified age-related cataract: Secondary | ICD-10-CM | POA: Diagnosis not present

## 2018-10-13 DIAGNOSIS — I10 Essential (primary) hypertension: Secondary | ICD-10-CM | POA: Diagnosis not present

## 2018-10-15 DIAGNOSIS — K635 Polyp of colon: Secondary | ICD-10-CM | POA: Diagnosis not present

## 2018-10-15 DIAGNOSIS — Z86718 Personal history of other venous thrombosis and embolism: Secondary | ICD-10-CM | POA: Diagnosis not present

## 2018-10-15 DIAGNOSIS — M9701XD Periprosthetic fracture around internal prosthetic right hip joint, subsequent encounter: Secondary | ICD-10-CM | POA: Diagnosis not present

## 2018-10-15 DIAGNOSIS — H259 Unspecified age-related cataract: Secondary | ICD-10-CM | POA: Diagnosis not present

## 2018-10-15 DIAGNOSIS — K210 Gastro-esophageal reflux disease with esophagitis: Secondary | ICD-10-CM | POA: Diagnosis not present

## 2018-10-15 DIAGNOSIS — I10 Essential (primary) hypertension: Secondary | ICD-10-CM | POA: Diagnosis not present

## 2018-10-20 DIAGNOSIS — H259 Unspecified age-related cataract: Secondary | ICD-10-CM | POA: Diagnosis not present

## 2018-10-20 DIAGNOSIS — Z86718 Personal history of other venous thrombosis and embolism: Secondary | ICD-10-CM | POA: Diagnosis not present

## 2018-10-20 DIAGNOSIS — M9701XD Periprosthetic fracture around internal prosthetic right hip joint, subsequent encounter: Secondary | ICD-10-CM | POA: Diagnosis not present

## 2018-10-20 DIAGNOSIS — K210 Gastro-esophageal reflux disease with esophagitis: Secondary | ICD-10-CM | POA: Diagnosis not present

## 2018-10-20 DIAGNOSIS — I10 Essential (primary) hypertension: Secondary | ICD-10-CM | POA: Diagnosis not present

## 2018-10-20 DIAGNOSIS — K635 Polyp of colon: Secondary | ICD-10-CM | POA: Diagnosis not present

## 2018-10-22 DIAGNOSIS — I10 Essential (primary) hypertension: Secondary | ICD-10-CM | POA: Diagnosis not present

## 2018-10-22 DIAGNOSIS — Z86718 Personal history of other venous thrombosis and embolism: Secondary | ICD-10-CM | POA: Diagnosis not present

## 2018-10-22 DIAGNOSIS — K210 Gastro-esophageal reflux disease with esophagitis: Secondary | ICD-10-CM | POA: Diagnosis not present

## 2018-10-22 DIAGNOSIS — K635 Polyp of colon: Secondary | ICD-10-CM | POA: Diagnosis not present

## 2018-10-22 DIAGNOSIS — M9701XD Periprosthetic fracture around internal prosthetic right hip joint, subsequent encounter: Secondary | ICD-10-CM | POA: Diagnosis not present

## 2018-10-22 DIAGNOSIS — H259 Unspecified age-related cataract: Secondary | ICD-10-CM | POA: Diagnosis not present

## 2018-10-26 DIAGNOSIS — M9701XD Periprosthetic fracture around internal prosthetic right hip joint, subsequent encounter: Secondary | ICD-10-CM | POA: Diagnosis not present

## 2018-10-26 DIAGNOSIS — Z86718 Personal history of other venous thrombosis and embolism: Secondary | ICD-10-CM | POA: Diagnosis not present

## 2018-10-26 DIAGNOSIS — I10 Essential (primary) hypertension: Secondary | ICD-10-CM | POA: Diagnosis not present

## 2018-10-26 DIAGNOSIS — K635 Polyp of colon: Secondary | ICD-10-CM | POA: Diagnosis not present

## 2018-10-26 DIAGNOSIS — K210 Gastro-esophageal reflux disease with esophagitis: Secondary | ICD-10-CM | POA: Diagnosis not present

## 2018-10-26 DIAGNOSIS — H259 Unspecified age-related cataract: Secondary | ICD-10-CM | POA: Diagnosis not present

## 2018-10-30 DIAGNOSIS — M9701XD Periprosthetic fracture around internal prosthetic right hip joint, subsequent encounter: Secondary | ICD-10-CM | POA: Diagnosis not present

## 2018-10-30 DIAGNOSIS — I10 Essential (primary) hypertension: Secondary | ICD-10-CM | POA: Diagnosis not present

## 2018-10-30 DIAGNOSIS — Z86718 Personal history of other venous thrombosis and embolism: Secondary | ICD-10-CM | POA: Diagnosis not present

## 2018-10-30 DIAGNOSIS — K210 Gastro-esophageal reflux disease with esophagitis: Secondary | ICD-10-CM | POA: Diagnosis not present

## 2018-10-30 DIAGNOSIS — K635 Polyp of colon: Secondary | ICD-10-CM | POA: Diagnosis not present

## 2018-10-30 DIAGNOSIS — H259 Unspecified age-related cataract: Secondary | ICD-10-CM | POA: Diagnosis not present

## 2019-01-20 ENCOUNTER — Emergency Department
Admission: EM | Admit: 2019-01-20 | Discharge: 2019-01-20 | Disposition: A | Discharge status: Home / Self Care | Payer: Medicare Other | Attending: Emergency Medicine | Admitting: Emergency Medicine

## 2019-01-20 ENCOUNTER — Encounter: Payer: Self-pay | Admitting: Emergency Medicine

## 2019-01-20 ENCOUNTER — Emergency Department: Payer: Medicare Other

## 2019-01-20 DIAGNOSIS — Z96641 Presence of right artificial hip joint: Secondary | ICD-10-CM | POA: Insufficient documentation

## 2019-01-20 DIAGNOSIS — I10 Essential (primary) hypertension: Secondary | ICD-10-CM | POA: Diagnosis not present

## 2019-01-20 DIAGNOSIS — Z79899 Other long term (current) drug therapy: Secondary | ICD-10-CM | POA: Insufficient documentation

## 2019-01-20 DIAGNOSIS — Z8673 Personal history of transient ischemic attack (TIA), and cerebral infarction without residual deficits: Secondary | ICD-10-CM | POA: Insufficient documentation

## 2019-01-20 DIAGNOSIS — R52 Pain, unspecified: Secondary | ICD-10-CM | POA: Diagnosis not present

## 2019-01-20 DIAGNOSIS — M25551 Pain in right hip: Secondary | ICD-10-CM

## 2019-01-20 DIAGNOSIS — M1612 Unilateral primary osteoarthritis, left hip: Secondary | ICD-10-CM | POA: Diagnosis not present

## 2019-01-20 DIAGNOSIS — Z7901 Long term (current) use of anticoagulants: Secondary | ICD-10-CM | POA: Insufficient documentation

## 2019-01-20 DIAGNOSIS — W19XXXA Unspecified fall, initial encounter: Secondary | ICD-10-CM | POA: Diagnosis not present

## 2019-01-20 DIAGNOSIS — M1711 Unilateral primary osteoarthritis, right knee: Secondary | ICD-10-CM | POA: Diagnosis not present

## 2019-01-20 MED ORDER — OXYCODONE HCL 5 MG PO TABS
2.5000 mg | ORAL_TABLET | Freq: Once | ORAL | Status: AC
  Administered 2019-01-20: 2.5 mg via ORAL
  Filled 2019-01-20: qty 1

## 2019-01-20 MED ORDER — OXYCODONE HCL 5 MG PO TABS: 2.5000 mg | ORAL_TABLET | Freq: Four times a day (QID) | ORAL | 0 refills | Status: DC | PRN

## 2019-01-20 MED ORDER — ACETAMINOPHEN 500 MG PO TABS
1000.0000 mg | ORAL_TABLET | Freq: Once | ORAL | Status: AC
  Administered 2019-01-20: 1000 mg via ORAL
  Filled 2019-01-20: qty 2

## 2019-01-20 NOTE — ED Notes (Signed)
Family at bedside. 

## 2019-01-20 NOTE — ED Notes (Signed)
ED Provider at bedside. 

## 2019-01-20 NOTE — ED Provider Notes (Signed)
[facility] Emergency Department Provider Note  ____________________________________________  Time seen: Approximately 9:20 AM  I have reviewed the triage vital signs and the nursing notes.   HISTORY  Chief Complaint Hip Pain   HPI Laurie Fuentes is a 86 y.o. female with the history of right-sided hip fracture status post replacement in 2017 and chronic right hip pain who presents for evaluation of right hip pain.  According to the family patient had a fall 2 weeks ago.  She constantly complains of pain in her right hip however this morning the pain became severe and she was unable to stand up from the bed.  No nausea, vomiting, diarrhea, or fever.  Patient has not received any medications at home for pain.  Past Medical History:  Diagnosis Date   GERD (gastroesophageal reflux disease)    Hypertension    Hypokalemia    Stroke [facility])     Patient Active Problem List   Diagnosis Date Noted   Rib contusion, left, subsequent encounter 08/08/2017   Chronic atrial fibrillation 05/28/2016   Hypokalemia 05/28/2016   Anticoagulant long-term use 04/02/2016   Hip fracture requiring operative repair ([place]) 01/07/2016   Essential hypertension 11/29/2015   Cerebral vascular disease 11/29/2015   GERD (gastroesophageal reflux disease) 06/14/2015   DVT of lower extremity (deep venous thrombosis) ([place]) 06/24/2014    Past Surgical History:  Procedure Laterality Date   GALLBLADDER SURGERY     HIP FRACTURE SURGERY Right     Prior to Admission medications   Medication Sig Start Date End Date Taking? Authorizing Provider  acetaminophen (TYLENOL) 500 MG tablet Take 500-1,000 mg by mouth every 6 (six) hours as needed for mild pain or fever.    Yes [provider]  Melatonin 3 MG TABS Take 1 tablet by mouth at bedtime.   Yes [provider]  oxyCODONE (ROXICODONE) 5 MG immediate release tablet Take 0.5 tablets (2.5 mg total) by mouth  every 6 (six) hours as needed for moderate pain or severe pain. 01/20/19 01/20/20  Rudene Re, MD  ranitidine (ZANTAC) 300 MG tablet Take 1 tablet (300 mg total) by mouth at bedtime. Patient not taking: Reported on 01/20/2019 08/10/18   Juline Patch, MD  traMADol-acetaminophen (ULTRACET) 37.5-325 MG tablet Take 1 tablet by mouth 2 (two) times daily. Patient not taking: Reported on 01/20/2019 02/20/18   Sable Feil, PA-C  warfarin (COUMADIN) 1 MG tablet TAKE ONE TABLET BY MOUTH ONCE DAILY USE  AS  DIRECTED Patient not taking: Reported on 01/20/2019 08/13/18   Juline Patch, MD  warfarin (COUMADIN) 2 MG tablet Take 1 tablet (2 mg total) by mouth daily. Patient not taking: Reported on 01/20/2019 08/13/18   Juline Patch, MD    Allergies Patient has no known allergies.  Family History  Problem Relation Age of Onset   Diabetes Sister     Social History Social History   Tobacco Use   Smoking status: Never Smoker   Smokeless tobacco: Never Used  Substance Use Topics   Alcohol use: No    Alcohol/week: 0.0 standard drinks   Drug use: No    Review of Systems  Constitutional: Negative for fever. Eyes: Negative for visual changes. ENT: Negative for sore throat. Neck: No neck pain  Cardiovascular: Negative for chest pain. Respiratory: Negative for shortness of breath. Gastrointestinal: Negative for abdominal pain, vomiting or diarrhea. Genitourinary: Negative for dysuria. Musculoskeletal: Negative for back pain. + R hip pain Skin: Negative for rash. Neurological:  Negative for headaches, weakness or numbness. Psych: No SI or HI  ____________________________________________   PHYSICAL EXAM:  VITAL SIGNS: ED Triage Vitals  Enc Vitals Group     BP 01/20/19 0903 (!) 189/75     Pulse Rate 01/20/19 0903 81     Resp 01/20/19 0903 18     Temp 01/20/19 0903 97.6 F (36.4 C)     Temp Source 01/20/19 0903 Oral     SpO2 01/20/19 0903 93 %     Weight 01/20/19 0904  137 lb 12.6 oz (62.5 kg)     Height 01/20/19 0904 5\' 3"  (1.6 m)     Head Circumference --      Peak Flow --      Pain Score --      Pain Loc --      Pain Edu? --      Excl. in [place]? --     Constitutional: Alert and oriented to self only, no apparent distress. HEENT:      Head: Normocephalic and atraumatic.         Eyes: Conjunctivae are normal. Sclera is non-icteric.       Mouth/Throat: Mucous membranes are moist.       Neck: Supple with no signs of meningismus. Cardiovascular: Regular rate and rhythm. No murmurs, gallops, or rubs. 2+ symmetrical distal pulses are present in all extremities. No JVD. Respiratory: Normal respiratory effort. Lungs are clear to auscultation bilaterally. No wheezes, crackles, or rhonchi.  Gastrointestinal: Soft, non tender, and non distended with positive bowel sounds. No rebound or guarding. Musculoskeletal: Tenderness to palpation over the right hip joint, significant pain is elicited with minimal range of motion neurologic: Normal speech and language. Face is symmetric. Moving all extremities. No gross focal neurologic deficits are appreciated. Skin: Skin is warm, dry and intact. No rash noted. Psychiatric: Mood and affect are normal. Speech and behavior are normal.  ____________________________________________   LABS (all labs ordered are listed, but only abnormal results are displayed)  Labs Reviewed - No data to display ____________________________________________  EKG  none  ____________________________________________  RADIOLOGY  I have personally reviewed the images performed during this visit and I agree with the Radiologist's read.   Interpretation by Radiologist:  Ct Pelvis Wo Contrast  Result Date: 01/20/2019 CLINICAL DATA:  Right hip and upper leg pain for 2 weeks a cheese worsened over the past 2 days. No known injury. EXAM: CT OF THE PELVIS AND RIGHT UPPER LEG WITHOUT CONTRAST TECHNIQUE: Multidetector CT imaging of the pelvis and  right upper leg was performed according to the standard protocol. COMPARISON:  Plain films right hip 05/02/2018 and today. FINDINGS: Bones/Joint/Cartilage Right hip hemiarthroplasty is in place. Long plate and screws are present on the femur for fixation of a healed periprosthetic fracture. As seen on the plain films earlier today, a screw in the mid diaphysis is broken. Hardware is otherwise unremarkable. The patient has a remote healed right parasymphyseal pubic bone fracture. No acute fracture is identified. Advanced osteoarthritis about the right knee is noted. Lower lumbar spondylosis is also seen. Left hip demonstrates moderate osteoarthritis and some chondrocalcinosis. Ligaments Suboptimally assessed by CT. Muscles and Tendons Intact.  No focal lesion. Soft tissues Imaged intrapelvic contents demonstrate no acute abnormality. Atherosclerosis noted. IMPRESSION: No acute abnormality. Status post right hip replacement. Healed periprosthetic fracture femur with fixation hardware in place. Remote healed right parasymphyseal pubic bone fracture. Advanced right knee osteoarthritis. Mild to moderate left hip osteoarthritis. Electronically Signed   By:  Inge Rise M.D.   On: 01/20/2019 10:53   Ct Femur Right Wo Contrast  Result Date: 01/20/2019 CLINICAL DATA:  Right hip and upper leg pain for 2 weeks a cheese worsened over the past 2 days. No known injury. EXAM: CT OF THE PELVIS AND RIGHT UPPER LEG WITHOUT CONTRAST TECHNIQUE: Multidetector CT imaging of the pelvis and right upper leg was performed according to the standard protocol. COMPARISON:  Plain films right hip 05/02/2018 and today. FINDINGS: Bones/Joint/Cartilage Right hip hemiarthroplasty is in place. Long plate and screws are present on the femur for fixation of a healed periprosthetic fracture. As seen on the plain films earlier today, a screw in the mid diaphysis is broken. Hardware is otherwise unremarkable. The patient has a remote healed right  parasymphyseal pubic bone fracture. No acute fracture is identified. Advanced osteoarthritis about the right knee is noted. Lower lumbar spondylosis is also seen. Left hip demonstrates moderate osteoarthritis and some chondrocalcinosis. Ligaments Suboptimally assessed by CT. Muscles and Tendons Intact.  No focal lesion. Soft tissues Imaged intrapelvic contents demonstrate no acute abnormality. Atherosclerosis noted. IMPRESSION: No acute abnormality. Status post right hip replacement. Healed periprosthetic fracture femur with fixation hardware in place. Remote healed right parasymphyseal pubic bone fracture. Advanced right knee osteoarthritis. Mild to moderate left hip osteoarthritis. Electronically Signed   By: Inge Rise M.D.   On: 01/20/2019 10:53   Dg Hip Unilat W Or Wo Pelvis 2-3 Views Right  Result Date: 01/20/2019 CLINICAL DATA:  Right-sided hip pain for 1 week, no known injury, initial encounter EXAM: DG HIP (WITH OR WITHOUT PELVIS) 2-3V RIGHT COMPARISON:  05/02/2018 FINDINGS: Postsurgical changes are noted in the proximal right femur. Prosthesis is again identified as well as fixation sideplate with multiple cerclage wires proximally. Healing callus is noted. No definitive acute fracture is seen. Fracture 1 of the fixation screws in the mid femur is seen of uncertain chronicity as it is new from the prior study. No new fracture is seen. Degenerative changes of lumbar spine and left hip joint are noted. IMPRESSION: Postsurgical changes as described. No acute abnormality is identified at this time Electronically Signed   By: Inez Catalina M.D.   On: 01/20/2019 09:44      ____________________________________________   PROCEDURES  Procedure(s) performed: None Procedures Critical Care performed:  None ____________________________________________   INITIAL IMPRESSION / ASSESSMENT AND PLAN / ED COURSE  85 y.o. female with the history of right-sided hip fracture status post replacement in  2017 and chronic right hip pain who presents for evaluation of right hip pain.  Patient with significant tenderness to palpation of the right hip and very limited range of motion due to pain.  Will get x-rays to rule out fracture.  Will give Tylenol thousand milligrams and 2.5 mg of oxycodone for pain.    _________________________ 11:11 AM on 01/20/2019 -----------------------------------------  XR and CT negative for acute findings. Patient able to ambulate with walker.  Family has walker and wheelchair for patient at home.  Will discharge home on 1000 g of Tylenol 3 times daily and 2.5 mg of oxycodone every 6 hours as needed.   As part of my medical decision making, I reviewed the following data within the [facility] notes reviewed and incorporated, Old chart reviewed, Radiograph reviewed , Notes from prior ED visits and [place] Controlled Substance Database    Pertinent labs & imaging results that were available during my care of the patient were reviewed by me and considered  in my medical decision making (see chart for details).    ____________________________________________   FINAL CLINICAL IMPRESSION(S) / ED DIAGNOSES  Final diagnoses:  Right hip pain      NEW MEDICATIONS STARTED DURING THIS VISIT:  ED Discharge Orders         Ordered    oxyCODONE (ROXICODONE) 5 MG immediate release tablet  Every 6 hours PRN     01/20/19 1110           Note:  This document was prepared using Dragon voice recognition software and may include unintentional dictation errors.    Alfred Levins, [place], MD 01/20/19 [phone]

## 2019-01-20 NOTE — ED Notes (Signed)
Patient transported to CT 

## 2019-01-20 NOTE — ED Notes (Signed)
Patient transported to X-ray 

## 2019-01-20 NOTE — ED Triage Notes (Addendum)
Pt from home via [facility]. Per EMS, pt has R/hip pain since last week, it has gotten worse within the last two days, pt has not been able to get up from bed due to pain. Per family, pt has a hx of dementia, two falls within the month, pt took amlodopine this am. VSS. NAD upon arrival.

## 2019-01-20 NOTE — Discharge Instructions (Addendum)
Pain control: Take tylenol 1000mg every 8 hours. Take 2.5mg of oxycodone every 6 hours for breakthrough pain. If you need the oxycodone make sure to take one senokot as well to prevent constipation. ° °Do not drink alcohol, drive or participate in any other potentially dangerous activities while taking this medication as it may make you sleepy. Do not take this medication with any other sedating medications, either prescription or over-the-counter. ° °

## 2019-01-20 NOTE — ED Notes (Signed)
Pt was able to ambulate with a walker, short distance in the room w/a steady gait. Denies pain at this time.

## 2019-07-21 ENCOUNTER — Other Ambulatory Visit: Payer: Self-pay

## 2019-07-21 ENCOUNTER — Emergency Department: Payer: Medicare Other

## 2019-07-21 ENCOUNTER — Inpatient Hospital Stay: Payer: Medicare Other

## 2019-07-21 ENCOUNTER — Inpatient Hospital Stay
Admission: EM | Admit: 2019-07-21 | Discharge: 2019-07-27 | DRG: 083 | Disposition: A | Discharge status: Skilled Nursing Facility | Payer: Medicare Other | Attending: Internal Medicine | Admitting: Internal Medicine

## 2019-07-21 DIAGNOSIS — Z79899 Other long term (current) drug therapy: Secondary | ICD-10-CM

## 2019-07-21 DIAGNOSIS — E876 Hypokalemia: Secondary | ICD-10-CM | POA: Diagnosis not present

## 2019-07-21 DIAGNOSIS — S065X9D Traumatic subdural hemorrhage with loss of consciousness of unspecified duration, subsequent encounter: Secondary | ICD-10-CM | POA: Diagnosis not present

## 2019-07-21 DIAGNOSIS — R296 Repeated falls: Secondary | ICD-10-CM | POA: Diagnosis present

## 2019-07-21 DIAGNOSIS — Z20828 Contact with and (suspected) exposure to other viral communicable diseases: Secondary | ICD-10-CM | POA: Diagnosis present

## 2019-07-21 DIAGNOSIS — S42031A Displaced fracture of lateral end of right clavicle, initial encounter for closed fracture: Secondary | ICD-10-CM | POA: Diagnosis not present

## 2019-07-21 DIAGNOSIS — W010XXA Fall on same level from slipping, tripping and stumbling without subsequent striking against object, initial encounter: Secondary | ICD-10-CM | POA: Diagnosis not present

## 2019-07-21 DIAGNOSIS — S065X9A Traumatic subdural hemorrhage with loss of consciousness of unspecified duration, initial encounter: Secondary | ICD-10-CM | POA: Diagnosis present

## 2019-07-21 DIAGNOSIS — I10 Essential (primary) hypertension: Secondary | ICD-10-CM | POA: Diagnosis present

## 2019-07-21 DIAGNOSIS — S065X0A Traumatic subdural hemorrhage without loss of consciousness, initial encounter: Secondary | ICD-10-CM | POA: Diagnosis not present

## 2019-07-21 DIAGNOSIS — W19XXXA Unspecified fall, initial encounter: Secondary | ICD-10-CM

## 2019-07-21 DIAGNOSIS — Z8673 Personal history of transient ischemic attack (TIA), and cerebral infarction without residual deficits: Secondary | ICD-10-CM

## 2019-07-21 DIAGNOSIS — S42021A Displaced fracture of shaft of right clavicle, initial encounter for closed fracture: Secondary | ICD-10-CM | POA: Diagnosis present

## 2019-07-21 DIAGNOSIS — R41841 Cognitive communication deficit: Secondary | ICD-10-CM | POA: Diagnosis not present

## 2019-07-21 DIAGNOSIS — E860 Dehydration: Secondary | ICD-10-CM | POA: Diagnosis not present

## 2019-07-21 DIAGNOSIS — S065XAA Traumatic subdural hemorrhage with loss of consciousness status unknown, initial encounter: Secondary | ICD-10-CM

## 2019-07-21 DIAGNOSIS — B9620 Unspecified Escherichia coli [E. coli] as the cause of diseases classified elsewhere: Secondary | ICD-10-CM | POA: Diagnosis present

## 2019-07-21 DIAGNOSIS — Z7189 Other specified counseling: Secondary | ICD-10-CM | POA: Diagnosis not present

## 2019-07-21 DIAGNOSIS — H9190 Unspecified hearing loss, unspecified ear: Secondary | ICD-10-CM | POA: Diagnosis present

## 2019-07-21 DIAGNOSIS — F0150 Vascular dementia without behavioral disturbance: Secondary | ICD-10-CM | POA: Diagnosis present

## 2019-07-21 DIAGNOSIS — N3000 Acute cystitis without hematuria: Secondary | ICD-10-CM | POA: Diagnosis present

## 2019-07-21 DIAGNOSIS — Z7401 Bed confinement status: Secondary | ICD-10-CM | POA: Diagnosis not present

## 2019-07-21 DIAGNOSIS — S42021D Displaced fracture of shaft of right clavicle, subsequent encounter for fracture with routine healing: Secondary | ICD-10-CM | POA: Diagnosis not present

## 2019-07-21 DIAGNOSIS — K219 Gastro-esophageal reflux disease without esophagitis: Secondary | ICD-10-CM | POA: Diagnosis present

## 2019-07-21 DIAGNOSIS — Z515 Encounter for palliative care: Secondary | ICD-10-CM

## 2019-07-21 DIAGNOSIS — M6281 Muscle weakness (generalized): Secondary | ICD-10-CM | POA: Diagnosis not present

## 2019-07-21 DIAGNOSIS — R079 Chest pain, unspecified: Secondary | ICD-10-CM | POA: Diagnosis not present

## 2019-07-21 DIAGNOSIS — R404 Transient alteration of awareness: Secondary | ICD-10-CM | POA: Diagnosis not present

## 2019-07-21 DIAGNOSIS — Z03818 Encounter for observation for suspected exposure to other biological agents ruled out: Secondary | ICD-10-CM | POA: Diagnosis not present

## 2019-07-21 DIAGNOSIS — S42009A Fracture of unspecified part of unspecified clavicle, initial encounter for closed fracture: Secondary | ICD-10-CM | POA: Diagnosis not present

## 2019-07-21 DIAGNOSIS — N390 Urinary tract infection, site not specified: Secondary | ICD-10-CM | POA: Diagnosis present

## 2019-07-21 DIAGNOSIS — R1311 Dysphagia, oral phase: Secondary | ICD-10-CM | POA: Diagnosis not present

## 2019-07-21 DIAGNOSIS — M2550 Pain in unspecified joint: Secondary | ICD-10-CM | POA: Diagnosis not present

## 2019-07-21 DIAGNOSIS — S199XXA Unspecified injury of neck, initial encounter: Secondary | ICD-10-CM | POA: Diagnosis not present

## 2019-07-21 DIAGNOSIS — M25511 Pain in right shoulder: Secondary | ICD-10-CM | POA: Diagnosis not present

## 2019-07-21 LAB — CBC WITH DIFFERENTIAL/PLATELET
Abs Immature Granulocytes: 0.03 10*3/uL (ref 0.00–0.07)
Basophils Absolute: 0.1 10*3/uL (ref 0.0–0.1)
Basophils Relative: 1 %
Eosinophils Absolute: 0 10*3/uL (ref 0.0–0.5)
Eosinophils Relative: 0 %
HCT: 33.8 % — ABNORMAL LOW (ref 36.0–46.0)
Hemoglobin: 10.9 g/dL — ABNORMAL LOW (ref 12.0–15.0)
Immature Granulocytes: 1 %
Lymphocytes Relative: 12 %
Lymphs Abs: 0.7 10*3/uL (ref 0.7–4.0)
MCH: 31.6 pg (ref 26.0–34.0)
MCHC: 32.2 g/dL (ref 30.0–36.0)
MCV: 98 fL (ref 80.0–100.0)
Monocytes Absolute: 0.4 10*3/uL (ref 0.1–1.0)
Monocytes Relative: 7 %
Neutro Abs: 4.9 10*3/uL (ref 1.7–7.7)
Neutrophils Relative %: 79 %
Platelets: 244 10*3/uL (ref 150–400)
RBC: 3.45 MIL/uL — ABNORMAL LOW (ref 3.87–5.11)
RDW: 12.4 % (ref 11.5–15.5)
WBC: 6.2 10*3/uL (ref 4.0–10.5)
nRBC: 0 % (ref 0.0–0.2)

## 2019-07-21 LAB — URINALYSIS, COMPLETE (UACMP) WITH MICROSCOPIC
Bilirubin Urine: NEGATIVE
Glucose, UA: NEGATIVE mg/dL
Hgb urine dipstick: NEGATIVE
Ketones, ur: NEGATIVE mg/dL
Nitrite: POSITIVE — AB
Protein, ur: NEGATIVE mg/dL
Specific Gravity, Urine: 1.015 (ref 1.005–1.030)
WBC, UA: >50 WBC/hpf — ABNORMAL HIGH (ref 0–5)
pH: 5 (ref 5.0–8.0)

## 2019-07-21 LAB — COMPREHENSIVE METABOLIC PANEL
ALT: 10 U/L (ref 0–44)
AST: 22 U/L (ref 15–41)
Albumin: 3.7 g/dL (ref 3.5–5.0)
Alkaline Phosphatase: 142 U/L — ABNORMAL HIGH (ref 38–126)
Anion gap: 10 (ref 5–15)
BUN: 17 mg/dL (ref 8–23)
CO2: 31 mmol/L (ref 22–32)
Calcium: 9.3 mg/dL (ref 8.9–10.3)
Chloride: 97 mmol/L — ABNORMAL LOW (ref 98–111)
Creatinine, Ser: 0.61 mg/dL (ref 0.44–1.00)
GFR calc Af Amer: >60 mL/min (ref >60)
GFR calc non Af Amer: >60 mL/min (ref >60)
Glucose, Bld: 100 mg/dL — ABNORMAL HIGH (ref 70–99)
Potassium: 2.8 mmol/L — ABNORMAL LOW (ref 3.5–5.1)
Sodium: 138 mmol/L (ref 135–145)
Total Bilirubin: 0.8 mg/dL (ref 0.3–1.2)
Total Protein: 7.2 g/dL (ref 6.5–8.1)

## 2019-07-21 LAB — PROTIME-INR
INR: 1 (ref 0.8–1.2)
Prothrombin Time: 12.8 seconds (ref 11.4–15.2)

## 2019-07-21 LAB — TSH: TSH: 2.257 u[IU]/mL (ref 0.350–4.500)

## 2019-07-21 MED ORDER — ONDANSETRON HCL 4 MG PO TABS: 4.0000 mg | ORAL_TABLET | Freq: Four times a day (QID) | ORAL | Status: DC | PRN

## 2019-07-21 MED ORDER — TRAMADOL HCL 50 MG PO TABS: 50.0000 mg | ORAL_TABLET | Freq: Four times a day (QID) | ORAL | Status: DC | PRN

## 2019-07-21 MED ORDER — POTASSIUM CHLORIDE 10 MEQ/100ML IV SOLN
10.0000 meq | INTRAVENOUS | Status: AC
  Administered 2019-07-21 (×2): 10 meq via INTRAVENOUS
  Filled 2019-07-21 (×4): qty 100

## 2019-07-21 MED ORDER — FENTANYL CITRATE (PF) 100 MCG/2ML IJ SOLN
12.5000 ug | Freq: Once | INTRAMUSCULAR | Status: AC
  Administered 2019-07-21: 12.5 ug via INTRAVENOUS
  Filled 2019-07-21: qty 2

## 2019-07-21 MED ORDER — ONDANSETRON HCL 4 MG/2ML IJ SOLN: 4.0000 mg | Freq: Four times a day (QID) | INTRAMUSCULAR | Status: DC | PRN

## 2019-07-21 MED ORDER — MELATONIN 5 MG PO TABS
5.0000 mg | ORAL_TABLET | Freq: Every evening | ORAL | Status: DC | PRN
  Administered 2019-07-23 – 2019-07-26 (×4): 5 mg via ORAL
  Filled 2019-07-21 (×5): qty 1

## 2019-07-21 MED ORDER — MORPHINE SULFATE (PF) 2 MG/ML IV SOLN
1.0000 mg | INTRAVENOUS | Status: DC | PRN
  Administered 2019-07-21: 16:00:00 1 mg via INTRAVENOUS

## 2019-07-21 MED ORDER — SODIUM CHLORIDE 0.9 % IV SOLN
Freq: Once | INTRAVENOUS | Status: AC
  Administered 2019-07-21: 14:00:00 via INTRAVENOUS

## 2019-07-21 MED ORDER — SODIUM CHLORIDE 0.9 % IV SOLN
1.0000 g | INTRAVENOUS | Status: DC
  Administered 2019-07-22 – 2019-07-24 (×3): 1 g via INTRAVENOUS
  Filled 2019-07-21 (×2): qty 1
  Filled 2019-07-21: qty 10
  Filled 2019-07-21: qty 1

## 2019-07-21 MED ORDER — MORPHINE SULFATE (PF) 2 MG/ML IV SOLN
INTRAVENOUS | Status: AC
  Administered 2019-07-21: 1 mg via INTRAVENOUS
  Filled 2019-07-21: qty 1

## 2019-07-21 MED ORDER — MORPHINE SULFATE (PF) 2 MG/ML IV SOLN: 2.0000 mg | INTRAVENOUS | Status: DC | PRN

## 2019-07-21 MED ORDER — LABETALOL HCL 5 MG/ML IV SOLN
10.0000 mg | INTRAVENOUS | Status: DC | PRN
  Filled 2019-07-21: qty 4

## 2019-07-21 MED ORDER — SODIUM CHLORIDE 0.9 % IV SOLN
1.0000 g | Freq: Once | INTRAVENOUS | Status: AC
  Administered 2019-07-21: 1 g via INTRAVENOUS
  Filled 2019-07-21: qty 10

## 2019-07-21 MED ORDER — POTASSIUM CHLORIDE CRYS ER 20 MEQ PO TBCR
40.0000 meq | EXTENDED_RELEASE_TABLET | Freq: Once | ORAL | Status: AC
  Administered 2019-07-21: 40 meq via ORAL
  Filled 2019-07-21: qty 2

## 2019-07-21 MED ORDER — ACETAMINOPHEN 650 MG RE SUPP: 650.0000 mg | Freq: Four times a day (QID) | RECTAL | Status: DC | PRN

## 2019-07-21 MED ORDER — SODIUM CHLORIDE 0.9 % IV SOLN
INTRAVENOUS | Status: DC
  Administered 2019-07-21 – 2019-07-26 (×6): via INTRAVENOUS

## 2019-07-21 MED ORDER — HYDRALAZINE HCL 20 MG/ML IJ SOLN
10.0000 mg | Freq: Four times a day (QID) | INTRAMUSCULAR | Status: DC | PRN
  Filled 2019-07-21: qty 1

## 2019-07-21 MED ORDER — ACETAMINOPHEN 325 MG PO TABS: 650.0000 mg | ORAL_TABLET | Freq: Four times a day (QID) | ORAL | Status: DC | PRN

## 2019-07-21 MED ORDER — AMLODIPINE BESYLATE 5 MG PO TABS
5.0000 mg | ORAL_TABLET | Freq: Every day | ORAL | Status: DC
  Administered 2019-07-21 – 2019-07-27 (×7): 5 mg via ORAL
  Filled 2019-07-21 (×7): qty 1

## 2019-07-21 NOTE — Consult Note (Signed)
ORTHOPAEDIC CONSULTATION  REQUESTING PHYSICIAN: Gladstone Lighter, MD  Chief Complaint:   Right shoulder pain.  History of Present Illness: Laurie Fuentes is a 85 y.o. female with a history of hypertension, hypokalemia, GERD, and status post a CVA who lives with her grandson.  Apparently she lost her balance and fell last night on her way to the bathroom, injuring her right shoulder.  Normally she asks for assistance, but did not do so on this particular time.  She was noted to have pain in her shoulder this morning and was brought to the emergency room where x-rays demonstrated a mildly displaced fracture of her right distal clavicle..  She also was noted to have hit her head and a CT scan in the ER demonstrated a small subdural hematoma.  Therefore, she has been admitted for observation and further evaluation.  I have been asked to evaluate her for her right clavicular fracture.  Past Medical History:  Diagnosis Date  . GERD (gastroesophageal reflux disease)   . Hypertension   . Hypokalemia   . Stroke [facility])    Past Surgical History:  Procedure Laterality Date  . GALLBLADDER SURGERY    . HIP FRACTURE SURGERY Right    Social History   Socioeconomic History  . Marital status: Widowed    Spouse name: Not on file  . Number of children: Not on file  . Years of education: Not on file  . Highest education level: Not on file  Occupational History  . Not on file  Social Needs  . Financial resource strain: Not on file  . Food insecurity    Worry: Not on file    Inability: Not on file  . Transportation needs    Medical: Not on file    Non-medical: Not on file  Tobacco Use  . Smoking status: Never Smoker  . Smokeless tobacco: Never Used  Substance and Sexual Activity  . Alcohol use: No    Alcohol/week: 0.0 standard drinks  . Drug use: No  . Sexual activity: Never  Lifestyle  . Physical activity    Days per week:  Not on file    Minutes per session: Not on file  . Stress: Not on file  Relationships  . Social Herbalist on phone: Not on file    Gets together: Not on file    Attends religious service: Not on file    Active member of club or organization: Not on file    Attends meetings of clubs or organizations: Not on file    Relationship status: Not on file  Other Topics Concern  . Not on file  Social History Narrative   Lives at home with her grandson.  Has a walker.   Family History  Problem Relation Age of Onset  . Diabetes Sister    No Known Allergies Prior to Admission medications   Medication Sig Start Date End Date Taking? Authorizing Provider  acetaminophen (TYLENOL) 500 MG tablet Take 500-1,000 mg by mouth every 6 (six) hours as needed for mild pain or fever.     [provider]  Melatonin 3 MG TABS Take 1 tablet by mouth at bedtime.    [provider]  oxyCODONE (ROXICODONE) 5 MG immediate release tablet Take 0.5 tablets (2.5 mg total) by mouth every 6 (six) hours as needed for moderate pain or severe pain. Patient not taking: Reported on 07/21/2019 01/20/19 01/20/20  Rudene Re, MD  ranitidine (ZANTAC) 300 MG tablet Take 1 tablet (  300 mg total) by mouth at bedtime. Patient not taking: Reported on 01/20/2019 08/10/18   Juline Patch, MD  warfarin (COUMADIN) 1 MG tablet TAKE ONE TABLET BY MOUTH ONCE DAILY USE  AS  DIRECTED Patient not taking: Reported on 01/20/2019 08/13/18   Juline Patch, MD  warfarin (COUMADIN) 2 MG tablet Take 1 tablet (2 mg total) by mouth daily. Patient not taking: Reported on 01/20/2019 08/13/18   Juline Patch, MD   Dg Shoulder Right  Result Date: 07/21/2019 CLINICAL DATA:  Right shoulder pain EXAM: RIGHT SHOULDER - 2+ VIEW COMPARISON:  Chest x-ray 05/02/2018 FINDINGS: There is an acute, mildly comminuted fracture of the distal right clavicle. Approximately 7 mm superior displacement of the distal fracture components.  Acromioclavicular joint alignment is maintained. Chronic healed fracture deformity of the right humeral head and neck. Glenohumeral joint is aligned without dislocation. Bones are diffusely demineralized. IMPRESSION: Acute, mildly comminuted and displaced fracture of the distal right clavicle. Electronically Signed   By: Davina Poke M.D.   On: 07/21/2019 12:25   Ct Head Wo Contrast  Result Date: 07/21/2019 CLINICAL DATA:  Known subdural hematoma.  Recent fall EXAM: CT HEAD WITHOUT CONTRAST TECHNIQUE: Contiguous axial images were obtained from the base of the skull through the vertex without intravenous contrast. COMPARISON:  July 21, 2019 study obtained earlier in the day. FINDINGS: Brain: Mild diffuse atrophy is stable. The previously noted acute subdural hematoma on the left involving portions of the left temporal and occipital regions is stable with a maximum thickness of 6 mm. There is slight mass effect on the left temporal and parietal lobes at the site of hematoma, stable. There is no midline shift. There is no new extra-axial fluid. There is small vessel disease in the centra semiovale bilaterally. There is a prior focal infarct involving the posterior left lentiform nucleus extending into the inferior left centrum semiovale, stable. Several prior small lacunar infarcts are noted in the periventricular white matter. No acute infarct is evident. No intra-axial mass or hemorrhage. Vascular: No hyperdense vessel. There is arterial vascular calcification in the distal right vertebral artery and in the carotid siphon regions bilaterally. Skull: There is frontal hyperostosis bilaterally. The bony calvarium appears intact. Sinuses/Orbits: There is slight mucosal thickening in several ethmoid air cells. Other visualized paranasal sinuses are clear. There is leftward deviation of the nasal septum. Orbits appear symmetric bilaterally. Other: Mastoid air cells are clear. IMPRESSION: 1. Stable 6 mm in  thickness left temporal and occipital acute subdural hematoma with slight localized mass effect but no midline shift. No progression of this hematoma compared to earlier in the day. No new extra-axial fluid. No intra-axial mass or hemorrhage. 2. Supratentorial small vessel disease. Prior focal infarct involving a portion of the left lentiform nucleus extending into the left centrum semiovale. No acute infarct evident. 3.  Multiple foci of arterial vascular calcification. 4.  Slight mucosal thickening in several ethmoid air cells. Electronically Signed   By: Lowella Grip III M.D.   On: 07/21/2019 15:47   Ct Head Wo Contrast  Result Date: 07/21/2019 CLINICAL DATA:  Fall last night. EXAM: CT HEAD WITHOUT CONTRAST CT CERVICAL SPINE WITHOUT CONTRAST TECHNIQUE: Multidetector CT imaging of the head and cervical spine was performed following the standard protocol without intravenous contrast. Multiplanar CT image reconstructions of the cervical spine were also generated. COMPARISON:  CT scan of May 02, 2018. FINDINGS: CT HEAD FINDINGS Brain: Mild chronic ischemic white matter disease is noted. Old infarcts are  seen involving both basal ganglia. Small subdural hematoma is seen overlying left cerebral convexity with maximum thickness of 6 mm. No significant mass effect or midline shift is noted. Ventricular size is within normal limits. No mass lesion or acute infarction is noted. Vascular: No hyperdense vessel or unexpected calcification. Skull: Normal. Negative for fracture or focal lesion. Sinuses/Orbits: No acute finding. Other: None. CT CERVICAL SPINE FINDINGS Alignment: Normal. Skull base and vertebrae: No acute fracture. No primary bone lesion or focal pathologic process. Soft tissues and spinal canal: No prevertebral fluid or swelling. No visible canal hematoma. Disc levels: Severe degenerative disc disease is noted at C4-5, C5-6 and C6-7 with anterior posterior osteophyte formation Upper chest: Negative.  Other: Degenerative changes are seen involving posterior facet joints bilaterally. IMPRESSION: Small subdural hematoma is seen overlying left cerebral convexity. No definite mass effect or midline shift is noted. Critical Value/emergent results were called by telephone at the time of interpretation on 07/21/2019 at 12:30 pm to providerDr. Ellender Hose, who verbally acknowledged these results. Severe multilevel degenerative disc disease. No acute abnormality seen in the cervical spine. Electronically Signed   By: Marijo Conception M.D.   On: 07/21/2019 12:30   Ct Cervical Spine Wo Contrast  Result Date: 07/21/2019 CLINICAL DATA:  Fall last night. EXAM: CT HEAD WITHOUT CONTRAST CT CERVICAL SPINE WITHOUT CONTRAST TECHNIQUE: Multidetector CT imaging of the head and cervical spine was performed following the standard protocol without intravenous contrast. Multiplanar CT image reconstructions of the cervical spine were also generated. COMPARISON:  CT scan of May 02, 2018. FINDINGS: CT HEAD FINDINGS Brain: Mild chronic ischemic white matter disease is noted. Old infarcts are seen involving both basal ganglia. Small subdural hematoma is seen overlying left cerebral convexity with maximum thickness of 6 mm. No significant mass effect or midline shift is noted. Ventricular size is within normal limits. No mass lesion or acute infarction is noted. Vascular: No hyperdense vessel or unexpected calcification. Skull: Normal. Negative for fracture or focal lesion. Sinuses/Orbits: No acute finding. Other: None. CT CERVICAL SPINE FINDINGS Alignment: Normal. Skull base and vertebrae: No acute fracture. No primary bone lesion or focal pathologic process. Soft tissues and spinal canal: No prevertebral fluid or swelling. No visible canal hematoma. Disc levels: Severe degenerative disc disease is noted at C4-5, C5-6 and C6-7 with anterior posterior osteophyte formation Upper chest: Negative. Other: Degenerative changes are seen involving  posterior facet joints bilaterally. IMPRESSION: Small subdural hematoma is seen overlying left cerebral convexity. No definite mass effect or midline shift is noted. Critical Value/emergent results were called by telephone at the time of interpretation on 07/21/2019 at 12:30 pm to providerDr. Ellender Hose, who verbally acknowledged these results. Severe multilevel degenerative disc disease. No acute abnormality seen in the cervical spine. Electronically Signed   By: Marijo Conception M.D.   On: 07/21/2019 12:30   Dg Chest Portable 1 View  Result Date: 07/21/2019 CLINICAL DATA:  Pain status post fall EXAM: PORTABLE CHEST 1 VIEW COMPARISON:  May 02, 2018 FINDINGS: There is an acute appearing displaced clavicle fracture on the right. There are advanced degenerative changes of both glenohumeral joints. There is a possible age-indeterminate fracture of the right fourth rib. There are advanced degenerative changes of both glenohumeral joints. The heart size is stable. Aortic calcifications are noted. There is no pneumothorax or significant pleural effusion. There is no focal infiltrate. IMPRESSION: 1. Acute displaced right-sided clavicle fracture. 2. Questionable age-indeterminate fracture of the right fourth rib. No evidence for right-sided pneumothorax. 3.  No focal infiltrate.  No significant pleural effusion. Electronically Signed   By: Constance Holster M.D.   On: 07/21/2019 13:42    Positive ROS: All other systems have been reviewed and were otherwise negative with the exception of those mentioned in the HPI and as above.  Physical Exam: General:  Alert, no acute distress Psychiatric:  Patient is very hard of hearing, but appears competent for consent with normal mood and affect   Cardiovascular:  No pedal edema Respiratory:  No wheezing, non-labored breathing GI:  Abdomen is soft and non-tender Skin:  No lesions in the area of chief complaint Neurologic:  Sensation intact distally Lymphatic:  No axillary  or cervical lymphadenopathy  Orthopedic Exam:  Orthopedic examination is limited to the right shoulder and upper extremity.  The arm is in a shoulder immobilizer.  There is moderate swelling with early ecchymosis noted over the anterior and lateral aspects of the shoulder.  She has moderate tenderness to palpation over the distal clavicular region.  Shoulder motion is not assessed on today on this examination.  She is able to flex and extend her wrist.  She also is able to actively flex and extend all digits, including her thumb.  Sensation is intact light touch to all distributions.  She has good capillary refill to her right hand.  X-rays:  X-rays of the right shoulder are available for review and have been reviewed by myself.  These films demonstrate a mildly displaced fracture involving the distal portion of the shaft, just medial to the coracoclavicular ligaments.  The [facility] joint does not appear to be affected, nor does there appear to be any change in the coracoclavicular distance.  All of the bones in the images are notable for significant osteopenia.  No lytic lesions are identified.  Assessment: Mildly displaced left distal clavicular shaft fracture, right shoulder.  Plan: The treatment options have been discussed with the patient and her grandson, who is at the bedside.  Based on her age, as well as the appearance on plain radiographs, I do not feel that surgical intervention is necessary for this injury.  She should stay in her shoulder immobilizer for the next 3 weeks or so, allowing the bones to "get sticky".  She may remove the shoulder immobilizer for hygiene and bathing purposes.  She may flex and extend her elbow, wrist, and fingers as she feels comfortable.  Thank you for asked me to participate in the care of this most unfortunate woman.  Please arrange for her to follow-up in our office in 3 to 4 weeks for repeat x-rays.   Pascal Lux, MD  Beeper #:  [phone]  07/21/2019 5:13 PM

## 2019-07-21 NOTE — ED Provider Notes (Addendum)
[facility] Emergency Department Provider Note  ____________________________________________   First MD Initiated Contact with Patient 07/21/19 1229     (approximate)  I have reviewed the triage vital signs and the nursing notes.   HISTORY  Chief Complaint Fall    HPI Laurie Fuentes is a 85 y.o. female with past medical history of hypokalemia, stroke, DVT, no longer on blood thinners, here with right arm pain.  The patient reportedly fell overnight.  She states that she got up to try to go to the bathroom and was using her walker.  It got stuck, and she fell, landing on the ground.  She actually fell earlier this week, and hit her head but did not transport to the ED.  She states she currently has 6 out of 10, aching, throbbing, right arm pain that is worse with any kind movement or palpation.  No distal numbness or weakness.  No headache.  She does note that she has had some bruising to the right side of her face but denies any associated other pain or neck pain.  She also endorses some general weakness.  She lives with her grandson, who is here.  He does note she is had some more difficulty than usual getting around.        Past Medical History:  Diagnosis Date   GERD (gastroesophageal reflux disease)    Hypertension    Hypokalemia    Stroke [facility])     Patient Active Problem List   Diagnosis Date Noted   UTI (urinary tract infection) 07/21/2019   Rib contusion, left, subsequent encounter 08/08/2017   Chronic atrial fibrillation 05/28/2016   Hypokalemia 05/28/2016   Anticoagulant long-term use 04/02/2016   Hip fracture requiring operative repair ([place]) 01/07/2016   Essential hypertension 11/29/2015   Cerebral vascular disease 11/29/2015   GERD (gastroesophageal reflux disease) 06/14/2015   DVT of lower extremity (deep venous thrombosis) ([place]) 06/24/2014    Past Surgical History:  Procedure Laterality Date   GALLBLADDER SURGERY       HIP FRACTURE SURGERY Right     Prior to Admission medications   Medication Sig Start Date End Date Taking? Authorizing Provider  acetaminophen (TYLENOL) 500 MG tablet Take 500-1,000 mg by mouth every 6 (six) hours as needed for mild pain or fever.     [provider]  Melatonin 3 MG TABS Take 1 tablet by mouth at bedtime.    [provider]  oxyCODONE (ROXICODONE) 5 MG immediate release tablet Take 0.5 tablets (2.5 mg total) by mouth every 6 (six) hours as needed for moderate pain or severe pain. Patient not taking: Reported on 07/21/2019 01/20/19 01/20/20  Rudene Re, MD  ranitidine (ZANTAC) 300 MG tablet Take 1 tablet (300 mg total) by mouth at bedtime. Patient not taking: Reported on 01/20/2019 08/10/18   Juline Patch, MD  warfarin (COUMADIN) 1 MG tablet TAKE ONE TABLET BY MOUTH ONCE DAILY USE  AS  DIRECTED Patient not taking: Reported on 01/20/2019 08/13/18   Juline Patch, MD  warfarin (COUMADIN) 2 MG tablet Take 1 tablet (2 mg total) by mouth daily. Patient not taking: Reported on 01/20/2019 08/13/18   Juline Patch, MD    Allergies Patient has no known allergies.  Family History  Problem Relation Age of Onset   Diabetes Sister     Social History Social History   Tobacco Use   Smoking status: Never Smoker   Smokeless tobacco: Never Used  Substance Use Topics  Alcohol use: No    Alcohol/week: 0.0 standard drinks   Drug use: No    Review of Systems  Review of Systems  Constitutional: Negative for chills and fever.  HENT: Negative for sore throat.   Respiratory: Negative for shortness of breath.   Cardiovascular: Negative for chest pain.  Gastrointestinal: Negative for abdominal pain.  Genitourinary: Negative for flank pain.  Musculoskeletal: Positive for arthralgias and joint swelling. Negative for neck pain.  Skin: Negative for rash and wound.  Allergic/Immunologic: Negative for immunocompromised state.  Neurological: Negative  for weakness and numbness.  Hematological: Bruises/bleeds easily.  All other systems reviewed and are negative.    ____________________________________________  PHYSICAL EXAM:      VITAL SIGNS: ED Triage Vitals  Enc Vitals Group     BP 07/21/19 1054 (!) 160/65     Pulse Rate 07/21/19 1054 79     Resp 07/21/19 1054 16     Temp 07/21/19 1054 97.8 F (36.6 C)     Temp Source 07/21/19 1054 Oral     SpO2 07/21/19 1054 100 %     Weight 07/21/19 1055 120 lb (54.4 kg)     Height 07/21/19 1055 5\' 5"  (1.651 m)     Head Circumference --      Peak Flow --      Pain Score 07/21/19 1055 7     Pain Loc --      Pain Edu? --      Excl. in [place]? --      Physical Exam Vitals signs and nursing note reviewed.  Constitutional:      General: She is not in acute distress.    Appearance: She is well-developed.  HENT:     Head: Normocephalic and atraumatic.     Comments: Healing ecchymoses throughout the right face, and right temple area.  No overt tenderness.  No crepitance.  No step-offs. Eyes:     Conjunctiva/sclera: Conjunctivae normal.  Neck:     Musculoskeletal: Neck supple.  Cardiovascular:     Rate and Rhythm: Normal rate and regular rhythm.     Heart sounds: Normal heart sounds. No murmur. No friction rub.  Pulmonary:     Effort: Pulmonary effort is normal. No respiratory distress.     Breath sounds: Normal breath sounds. No wheezing or rales.  Abdominal:     General: There is no distension.     Palpations: Abdomen is soft.     Tenderness: There is no abdominal tenderness.  Musculoskeletal:     Comments: Significant bruising noted to the right upper chest wall, with tenderness over the clavicle.  No skin tenting or open wounds.  Skin:    General: Skin is warm.     Capillary Refill: Capillary refill takes less than 2 seconds.  Neurological:     Mental Status: She is alert and oriented to person, place, and time.     GCS: GCS eye subscore is 4. GCS verbal subscore is 5. GCS  motor subscore is 6.     Cranial Nerves: Cranial nerves are intact.     Sensory: Sensation is intact.     Motor: Motor function is intact. No abnormal muscle tone.     Coordination: Coordination is intact.       ____________________________________________   LABS (all labs ordered are listed, but only abnormal results are displayed)  Labs Reviewed  CBC WITH DIFFERENTIAL/PLATELET - Abnormal; Notable for the following components:      Result Value   RBC  3.45 (*)    Hemoglobin 10.9 (*)    HCT 33.8 (*)    All other components within normal limits  COMPREHENSIVE METABOLIC PANEL - Abnormal; Notable for the following components:   Potassium 2.8 (*)    Chloride 97 (*)    Glucose, Bld 100 (*)    Alkaline Phosphatase 142 (*)    All other components within normal limits  URINALYSIS, COMPLETE (UACMP) WITH MICROSCOPIC - Abnormal; Notable for the following components:   Color, Urine YELLOW (*)    APPearance CLOUDY (*)    Nitrite POSITIVE (*)    Leukocytes,Ua MODERATE (*)    WBC, UA >50 (*)    Bacteria, UA MANY (*)    All other components within normal limits  SARS CORONAVIRUS 2 (TAT 6-24 HRS)  URINE CULTURE  PROTIME-INR  TSH  BASIC METABOLIC PANEL  CBC    ____________________________________________  EKG: Normal sinus rhythm, VR 69. QRS 134, QTc 460. No acute ST elevations or depressions.   EKG Interpretation  Date/Time:  Wednesday July 21 2019 13:36:10 EDT Ventricular Rate:  69 PR Interval:    QRS Duration: 134 QT Interval:  429 QTC Calculation: 460 R Axis:   -20 Text Interpretation:  Sinus rhythm Ventricular premature complex Right bundle branch block Left ventricular hypertrophy Baseline wander in lead(s) V6 ----------unconfirmed---------- Confirmed by OVERREAD, NOT ([id number]), editor Helene Kelp [phone]) on 07/26/2019 12:25:24 PM       ________________________________________  RADIOLOGY All imaging, including plain films, CT scans, and ultrasounds,  independently reviewed by me, and interpretations confirmed via formal radiology reads.  ED MD interpretation:   CT: Small SDH, no midline shift CT Spine: Chronic changes, otherwise negative XR Shoulder: Clavicle fx, comminuted, no tenting CXR: R clavicle fx, ? Rib fx, no focal infiltrate  Official radiology report(s): Dg Shoulder Right  Result Date: 07/21/2019 CLINICAL DATA:  Right shoulder pain EXAM: RIGHT SHOULDER - 2+ VIEW COMPARISON:  Chest x-ray 05/02/2018 FINDINGS: There is an acute, mildly comminuted fracture of the distal right clavicle. Approximately 7 mm superior displacement of the distal fracture components. Acromioclavicular joint alignment is maintained. Chronic healed fracture deformity of the right humeral head and neck. Glenohumeral joint is aligned without dislocation. Bones are diffusely demineralized. IMPRESSION: Acute, mildly comminuted and displaced fracture of the distal right clavicle. Electronically Signed   By: Davina Poke M.D.   On: 07/21/2019 12:25   Ct Head Wo Contrast  Result Date: 07/21/2019 CLINICAL DATA:  Known subdural hematoma.  Recent fall EXAM: CT HEAD WITHOUT CONTRAST TECHNIQUE: Contiguous axial images were obtained from the base of the skull through the vertex without intravenous contrast. COMPARISON:  July 21, 2019 study obtained earlier in the day. FINDINGS: Brain: Mild diffuse atrophy is stable. The previously noted acute subdural hematoma on the left involving portions of the left temporal and occipital regions is stable with a maximum thickness of 6 mm. There is slight mass effect on the left temporal and parietal lobes at the site of hematoma, stable. There is no midline shift. There is no new extra-axial fluid. There is small vessel disease in the centra semiovale bilaterally. There is a prior focal infarct involving the posterior left lentiform nucleus extending into the inferior left centrum semiovale, stable. Several prior small lacunar  infarcts are noted in the periventricular white matter. No acute infarct is evident. No intra-axial mass or hemorrhage. Vascular: No hyperdense vessel. There is arterial vascular calcification in the distal right vertebral artery and in the carotid siphon regions bilaterally. Skull:  There is frontal hyperostosis bilaterally. The bony calvarium appears intact. Sinuses/Orbits: There is slight mucosal thickening in several ethmoid air cells. Other visualized paranasal sinuses are clear. There is leftward deviation of the nasal septum. Orbits appear symmetric bilaterally. Other: Mastoid air cells are clear. IMPRESSION: 1. Stable 6 mm in thickness left temporal and occipital acute subdural hematoma with slight localized mass effect but no midline shift. No progression of this hematoma compared to earlier in the day. No new extra-axial fluid. No intra-axial mass or hemorrhage. 2. Supratentorial small vessel disease. Prior focal infarct involving a portion of the left lentiform nucleus extending into the left centrum semiovale. No acute infarct evident. 3.  Multiple foci of arterial vascular calcification. 4.  Slight mucosal thickening in several ethmoid air cells. Electronically Signed   By: Lowella Grip III M.D.   On: 07/21/2019 15:47   Ct Head Wo Contrast  Result Date: 07/21/2019 CLINICAL DATA:  Fall last night. EXAM: CT HEAD WITHOUT CONTRAST CT CERVICAL SPINE WITHOUT CONTRAST TECHNIQUE: Multidetector CT imaging of the head and cervical spine was performed following the standard protocol without intravenous contrast. Multiplanar CT image reconstructions of the cervical spine were also generated. COMPARISON:  CT scan of May 02, 2018. FINDINGS: CT HEAD FINDINGS Brain: Mild chronic ischemic white matter disease is noted. Old infarcts are seen involving both basal ganglia. Small subdural hematoma is seen overlying left cerebral convexity with maximum thickness of 6 mm. No significant mass effect or midline shift  is noted. Ventricular size is within normal limits. No mass lesion or acute infarction is noted. Vascular: No hyperdense vessel or unexpected calcification. Skull: Normal. Negative for fracture or focal lesion. Sinuses/Orbits: No acute finding. Other: None. CT CERVICAL SPINE FINDINGS Alignment: Normal. Skull base and vertebrae: No acute fracture. No primary bone lesion or focal pathologic process. Soft tissues and spinal canal: No prevertebral fluid or swelling. No visible canal hematoma. Disc levels: Severe degenerative disc disease is noted at C4-5, C5-6 and C6-7 with anterior posterior osteophyte formation Upper chest: Negative. Other: Degenerative changes are seen involving posterior facet joints bilaterally. IMPRESSION: Small subdural hematoma is seen overlying left cerebral convexity. No definite mass effect or midline shift is noted. Critical Value/emergent results were called by telephone at the time of interpretation on 07/21/2019 at 12:30 pm to providerDr. Ellender Hose, who verbally acknowledged these results. Severe multilevel degenerative disc disease. No acute abnormality seen in the cervical spine. Electronically Signed   By: Marijo Conception M.D.   On: 07/21/2019 12:30   Ct Cervical Spine Wo Contrast  Result Date: 07/21/2019 CLINICAL DATA:  Fall last night. EXAM: CT HEAD WITHOUT CONTRAST CT CERVICAL SPINE WITHOUT CONTRAST TECHNIQUE: Multidetector CT imaging of the head and cervical spine was performed following the standard protocol without intravenous contrast. Multiplanar CT image reconstructions of the cervical spine were also generated. COMPARISON:  CT scan of May 02, 2018. FINDINGS: CT HEAD FINDINGS Brain: Mild chronic ischemic white matter disease is noted. Old infarcts are seen involving both basal ganglia. Small subdural hematoma is seen overlying left cerebral convexity with maximum thickness of 6 mm. No significant mass effect or midline shift is noted. Ventricular size is within normal limits.  No mass lesion or acute infarction is noted. Vascular: No hyperdense vessel or unexpected calcification. Skull: Normal. Negative for fracture or focal lesion. Sinuses/Orbits: No acute finding. Other: None. CT CERVICAL SPINE FINDINGS Alignment: Normal. Skull base and vertebrae: No acute fracture. No primary bone lesion or focal pathologic process. Soft tissues and  spinal canal: No prevertebral fluid or swelling. No visible canal hematoma. Disc levels: Severe degenerative disc disease is noted at C4-5, C5-6 and C6-7 with anterior posterior osteophyte formation Upper chest: Negative. Other: Degenerative changes are seen involving posterior facet joints bilaterally. IMPRESSION: Small subdural hematoma is seen overlying left cerebral convexity. No definite mass effect or midline shift is noted. Critical Value/emergent results were called by telephone at the time of interpretation on 07/21/2019 at 12:30 pm to providerDr. Ellender Hose, who verbally acknowledged these results. Severe multilevel degenerative disc disease. No acute abnormality seen in the cervical spine. Electronically Signed   By: Marijo Conception M.D.   On: 07/21/2019 12:30   Dg Chest Portable 1 View  Result Date: 07/21/2019 CLINICAL DATA:  Pain status post fall EXAM: PORTABLE CHEST 1 VIEW COMPARISON:  May 02, 2018 FINDINGS: There is an acute appearing displaced clavicle fracture on the right. There are advanced degenerative changes of both glenohumeral joints. There is a possible age-indeterminate fracture of the right fourth rib. There are advanced degenerative changes of both glenohumeral joints. The heart size is stable. Aortic calcifications are noted. There is no pneumothorax or significant pleural effusion. There is no focal infiltrate. IMPRESSION: 1. Acute displaced right-sided clavicle fracture. 2. Questionable age-indeterminate fracture of the right fourth rib. No evidence for right-sided pneumothorax. 3. No focal infiltrate.  No significant pleural  effusion. Electronically Signed   By: Constance Holster M.D.   On: 07/21/2019 13:42    ____________________________________________  PROCEDURES   Procedure(s) performed (including Critical Care):  .Critical Care Performed by: Duffy Bruce, MD Authorized by: Duffy Bruce, MD   Critical care provider statement:    Critical care time (minutes):  35   Critical care time was exclusive of:  Separately billable procedures and treating other patients and teaching time   Critical care was necessary to treat or prevent imminent or life-threatening deterioration of the following conditions:  Trauma, CNS failure or compromise, circulatory failure and cardiac failure   Critical care was time spent personally by me on the following activities:  Development of treatment plan with patient or surrogate, discussions with consultants, evaluation of patient's response to treatment, examination of patient, obtaining history from patient or surrogate, ordering and performing treatments and interventions, ordering and review of laboratory studies, ordering and review of radiographic studies, pulse oximetry, re-evaluation of patient's condition and review of old charts   I assumed direction of critical care for this patient from another provider in my specialty: no      ____________________________________________  INITIAL IMPRESSION / MDM / [place] / ED COURSE  As part of my medical decision making, I reviewed the following data within the [place] was evaluated in Emergency Department on 07/21/2019 for the symptoms described in the history of present illness. She was evaluated in the context of the global COVID-19 pandemic, which necessitated consideration that the patient might be at risk for infection with the SARS-CoV-2 virus that causes COVID-19. Institutional protocols and algorithms that pertain to the evaluation of patients at risk for  COVID-19 are in a state of rapid change based on information released by regulatory bodies including the CDC and federal and state organizations. These policies and algorithms were followed during the patient's care in the ED.  Some ED evaluations and interventions may be delayed as a result of limited staffing during the pandemic.*   Clinical Course as of Jul 20 1848  Wed  Jul 21, 2019  1357 CT Head Wo Contrast [RS]    Clinical Course User Index [RS] Sable Feil, PA-C    Medical Decision Making: 85 year old female here with recurrent falls, right shoulder pain, and bruising to the right face.  Imaging shows small subdural without midline shift.  Discussed with Dr. Cari Caraway, who will see the patient.  Highly likely to be nonoperative.  Right clavicle fracture also noted, which is likely not off as well and the case was discussed with Dr.Poggi - sling immobilizer in place. Labs show hypoK, UTI. Admit for pain control, monitoring, PT/oT, Tx of UTI.  ____________________________________________  FINAL CLINICAL IMPRESSION(S) / ED DIAGNOSES  Final diagnoses:  Fall, initial encounter  Subdural hematoma ([place])  Closed displaced fracture of shaft of right clavicle, initial encounter  Hypokalemia  Lower urinary tract infectious disease     MEDICATIONS GIVEN DURING THIS VISIT:  Medications  Melatonin TABS 5 mg (has no administration in time range)  acetaminophen (TYLENOL) tablet 650 mg (has no administration in time range)    Or  acetaminophen (TYLENOL) suppository 650 mg (has no administration in time range)  traMADol (ULTRAM) tablet 50 mg (has no administration in time range)  ondansetron (ZOFRAN) tablet 4 mg (has no administration in time range)    Or  ondansetron (ZOFRAN) injection 4 mg (has no administration in time range)  0.9 %  sodium chloride infusion ( Intravenous Stopped 07/21/19 1739)  cefTRIAXone (ROCEPHIN) 1 g in sodium chloride 0.9 % 100 mL IVPB (has no administration in  time range)  hydrALAZINE (APRESOLINE) injection 10 mg (has no administration in time range)  morphine 2 MG/ML injection 1-2 mg (1 mg Intravenous Given 07/21/19 1551)  0.9 %  sodium chloride infusion ( Intravenous Transfusing/Transfer 07/21/19 1605)  fentaNYL (SUBLIMAZE) injection 12.5 mcg (12.5 mcg Intravenous Given 07/21/19 1352)  cefTRIAXone (ROCEPHIN) 1 g in sodium chloride 0.9 % 100 mL IVPB (0 g Intravenous Stopped 07/21/19 1528)  potassium chloride SA (K-DUR) CR tablet 40 mEq (40 mEq Oral Given 07/21/19 1451)  potassium chloride 10 mEq in 100 mL IVPB ( Intravenous Rate/Dose Verify 07/21/19 1756)     ED Discharge Orders    None       Note:  This document was prepared using Dragon voice recognition software and may include unintentional dictation errors.   Duffy Bruce, MD 07/21/19 1850    Duffy Bruce, MD 07/30/19 Maudie Flakes    Duffy Bruce, MD 08/17/19 1734

## 2019-07-21 NOTE — H&P (Signed)
[facility] NAME: Laurie Fuentes    MR#:  [id number]  DATE OF BIRTH:  1926-06-14  DATE OF ADMISSION:  07/21/2019  PRIMARY CARE PHYSICIAN: Juline Patch, MD   REQUESTING/REFERRING PHYSICIAN:  Dr. Duffy Bruce  CHIEF COMPLAINT:   Chief Complaint  Patient presents with   Fall    HISTORY OF PRESENT ILLNESS:  Laurie Fuentes  is a 85 y.o. female with a known history of prior stroke with no residual deficits, hypertension, GERD currently not on any medications, significant hard of hearing and very poor visual acuity who stays at home with her grandson is brought in secondary to a fall last night and right chest pain today. Patient is extremely hard of hearing and does not have her hearing aids at this time.  Most of the history is obtained from her grandson at bedside.  Patient has good days and bad days with her memory.  Most of the day she is able to participate in conversation with her grandson.  She ambulates with a walker.  But she forgets to call her grandson when she needs to go to the bathroom at nighttime.  This is her third fall in several months.  Last night she got up to go to the bathroom got tangled in the wires and fell to the floor.  Grandson heard the noise and came into her room, she was conscious, denied any pain and was put back to bed.  This morning patient woke up fine, had her breakfast and then started complaining of right chest and shoulder pain.  Has mild headaches.  No nausea or vomiting. Work-up in the ED showing hypokalemia, UTI on labs.  CT of the head showing small subdural hematoma and right shoulder x-ray showing acute displaced fracture of right distal clavicle.   PAST MEDICAL HISTORY:   Past Medical History:  Diagnosis Date   GERD (gastroesophageal reflux disease)    Hypertension    Hypokalemia    Stroke ([place])     PAST SURGICAL HISTORY:   Past Surgical History:  Procedure Laterality Date    GALLBLADDER SURGERY     HIP FRACTURE SURGERY Right     SOCIAL HISTORY:   Social History   Tobacco Use   Smoking status: Never Smoker   Smokeless tobacco: Never Used  Substance Use Topics   Alcohol use: No    Alcohol/week: 0.0 standard drinks    FAMILY HISTORY:   Family History  Problem Relation Age of Onset   Diabetes Sister     DRUG ALLERGIES:  No Known Allergies  REVIEW OF SYSTEMS:   Review of Systems  Constitutional: Positive for malaise/fatigue. Negative for chills, fever and weight loss.  HENT: Positive for hearing loss. Negative for ear discharge, ear pain, nosebleeds and tinnitus.   Eyes: Positive for blurred vision. Negative for double vision and photophobia.  Respiratory: Negative for cough, hemoptysis, shortness of breath and wheezing.   Cardiovascular: Negative for chest pain, palpitations, orthopnea and leg swelling.  Gastrointestinal: Negative for abdominal pain, constipation, diarrhea, heartburn, melena, nausea and vomiting.  Genitourinary: Negative for dysuria, frequency, hematuria and urgency.  Musculoskeletal: Positive for falls and myalgias. Negative for back pain and neck pain.  Skin: Negative for rash.  Neurological: Positive for headaches. Negative for dizziness, tingling, tremors, sensory change, speech change and focal weakness.  Endo/Heme/Allergies: Does not bruise/bleed easily.  Psychiatric/Behavioral: Negative for depression.    MEDICATIONS AT HOME:   Prior to Admission  medications   Medication Sig Start Date End Date Taking? Authorizing Provider  acetaminophen (TYLENOL) 500 MG tablet Take 500-1,000 mg by mouth every 6 (six) hours as needed for mild pain or fever.     [provider]  Melatonin 3 MG TABS Take 1 tablet by mouth at bedtime.    [provider]  oxyCODONE (ROXICODONE) 5 MG immediate release tablet Take 0.5 tablets (2.5 mg total) by mouth every 6 (six) hours as needed for moderate pain or severe  pain. Patient not taking: Reported on 07/21/2019 01/20/19 01/20/20  Rudene Re, MD  ranitidine (ZANTAC) 300 MG tablet Take 1 tablet (300 mg total) by mouth at bedtime. Patient not taking: Reported on 01/20/2019 08/10/18   Juline Patch, MD  warfarin (COUMADIN) 1 MG tablet TAKE ONE TABLET BY MOUTH ONCE DAILY USE  AS  DIRECTED Patient not taking: Reported on 01/20/2019 08/13/18   Juline Patch, MD  warfarin (COUMADIN) 2 MG tablet Take 1 tablet (2 mg total) by mouth daily. Patient not taking: Reported on 01/20/2019 08/13/18   Juline Patch, MD      VITAL SIGNS:  Blood pressure (!) 157/70, pulse 74, temperature 97.8 F (36.6 C), temperature source Oral, resp. rate 16, height 5\' 5"  (1.651 m), weight 54.4 kg, SpO2 100 %.  PHYSICAL EXAMINATION:  Physical Exam  GENERAL:  85 y.o.-year-old thin built elderly patient sitting in the bed with no acute distress.  Patient is extremely hard of hearing EYES: Pupils equal, round, reactive to light and accommodation. No scleral icterus. Extraocular muscles intact.  Very poor visual acuity HEENT: Head atraumatic, normocephalic. Oropharynx and nasopharynx clear.  Bruising noted on the right side of her face near the temples. NECK:  Supple, no jugular venous distention. No thyroid enlargement, no tenderness.  LUNGS: Ecchymosis and bruising around the right clavicular area in the anterior chest, tender to touch. Normal breath sounds bilaterally, no wheezing, rales,rhonchi or crepitation. No use of accessory muscles of respiration.  Decreased bibasilar breath sounds CARDIOVASCULAR: S1, S2 normal. No  rubs, or gallops.  2/6 systolic murmur is present ABDOMEN: Soft, nontender, nondistended. Bowel sounds present. No organomegaly or mass.  EXTREMITIES: No pedal edema, cyanosis, or clubbing.  NEUROLOGIC: Cranial nerves II through XII are intact. Muscle strength equal in all extremities. Sensation intact. Gait not checked.  Global weakness. PSYCHIATRIC: The  patient is alert and oriented x2.  Patient is extremely hard of hearing SKIN: No obvious rash, lesion, or ulcer.   LABORATORY PANEL:   CBC Recent Labs  Lab 07/21/19 1246  WBC 6.2  HGB 10.9*  HCT 33.8*  PLT 244   ------------------------------------------------------------------------------------------------------------------  Chemistries  Recent Labs  Lab 07/21/19 1246  NA 138  K 2.8*  CL 97*  CO2 31  GLUCOSE 100*  BUN 17  CREATININE 0.61  CALCIUM 9.3  AST 22  ALT 10  ALKPHOS 142*  BILITOT 0.8   ------------------------------------------------------------------------------------------------------------------  Cardiac Enzymes No results for input(s): TROPONINI in the last 168 hours. ------------------------------------------------------------------------------------------------------------------  RADIOLOGY:  Dg Shoulder Right  Result Date: 07/21/2019 CLINICAL DATA:  Right shoulder pain EXAM: RIGHT SHOULDER - 2+ VIEW COMPARISON:  Chest x-ray 05/02/2018 FINDINGS: There is an acute, mildly comminuted fracture of the distal right clavicle. Approximately 7 mm superior displacement of the distal fracture components. Acromioclavicular joint alignment is maintained. Chronic healed fracture deformity of the right humeral head and neck. Glenohumeral joint is aligned without dislocation. Bones are diffusely demineralized. IMPRESSION: Acute, mildly comminuted and displaced fracture of the  distal right clavicle. Electronically Signed   By: Davina Poke M.D.   On: 07/21/2019 12:25   Ct Head Wo Contrast  Result Date: 07/21/2019 CLINICAL DATA:  Fall last night. EXAM: CT HEAD WITHOUT CONTRAST CT CERVICAL SPINE WITHOUT CONTRAST TECHNIQUE: Multidetector CT imaging of the head and cervical spine was performed following the standard protocol without intravenous contrast. Multiplanar CT image reconstructions of the cervical spine were also generated. COMPARISON:  CT scan of May 02, 2018.  FINDINGS: CT HEAD FINDINGS Brain: Mild chronic ischemic white matter disease is noted. Old infarcts are seen involving both basal ganglia. Small subdural hematoma is seen overlying left cerebral convexity with maximum thickness of 6 mm. No significant mass effect or midline shift is noted. Ventricular size is within normal limits. No mass lesion or acute infarction is noted. Vascular: No hyperdense vessel or unexpected calcification. Skull: Normal. Negative for fracture or focal lesion. Sinuses/Orbits: No acute finding. Other: None. CT CERVICAL SPINE FINDINGS Alignment: Normal. Skull base and vertebrae: No acute fracture. No primary bone lesion or focal pathologic process. Soft tissues and spinal canal: No prevertebral fluid or swelling. No visible canal hematoma. Disc levels: Severe degenerative disc disease is noted at C4-5, C5-6 and C6-7 with anterior posterior osteophyte formation Upper chest: Negative. Other: Degenerative changes are seen involving posterior facet joints bilaterally. IMPRESSION: Small subdural hematoma is seen overlying left cerebral convexity. No definite mass effect or midline shift is noted. Critical Value/emergent results were called by telephone at the time of interpretation on 07/21/2019 at 12:30 pm to providerDr. Ellender Hose, who verbally acknowledged these results. Severe multilevel degenerative disc disease. No acute abnormality seen in the cervical spine. Electronically Signed   By: Marijo Conception M.D.   On: 07/21/2019 12:30   Ct Cervical Spine Wo Contrast  Result Date: 07/21/2019 CLINICAL DATA:  Fall last night. EXAM: CT HEAD WITHOUT CONTRAST CT CERVICAL SPINE WITHOUT CONTRAST TECHNIQUE: Multidetector CT imaging of the head and cervical spine was performed following the standard protocol without intravenous contrast. Multiplanar CT image reconstructions of the cervical spine were also generated. COMPARISON:  CT scan of May 02, 2018. FINDINGS: CT HEAD FINDINGS Brain: Mild chronic  ischemic white matter disease is noted. Old infarcts are seen involving both basal ganglia. Small subdural hematoma is seen overlying left cerebral convexity with maximum thickness of 6 mm. No significant mass effect or midline shift is noted. Ventricular size is within normal limits. No mass lesion or acute infarction is noted. Vascular: No hyperdense vessel or unexpected calcification. Skull: Normal. Negative for fracture or focal lesion. Sinuses/Orbits: No acute finding. Other: None. CT CERVICAL SPINE FINDINGS Alignment: Normal. Skull base and vertebrae: No acute fracture. No primary bone lesion or focal pathologic process. Soft tissues and spinal canal: No prevertebral fluid or swelling. No visible canal hematoma. Disc levels: Severe degenerative disc disease is noted at C4-5, C5-6 and C6-7 with anterior posterior osteophyte formation Upper chest: Negative. Other: Degenerative changes are seen involving posterior facet joints bilaterally. IMPRESSION: Small subdural hematoma is seen overlying left cerebral convexity. No definite mass effect or midline shift is noted. Critical Value/emergent results were called by telephone at the time of interpretation on 07/21/2019 at 12:30 pm to providerDr. Ellender Hose, who verbally acknowledged these results. Severe multilevel degenerative disc disease. No acute abnormality seen in the cervical spine. Electronically Signed   By: Marijo Conception M.D.   On: 07/21/2019 12:30   Dg Chest Portable 1 View  Result Date: 07/21/2019 CLINICAL DATA:  Pain status  post fall EXAM: PORTABLE CHEST 1 VIEW COMPARISON:  May 02, 2018 FINDINGS: There is an acute appearing displaced clavicle fracture on the right. There are advanced degenerative changes of both glenohumeral joints. There is a possible age-indeterminate fracture of the right fourth rib. There are advanced degenerative changes of both glenohumeral joints. The heart size is stable. Aortic calcifications are noted. There is no  pneumothorax or significant pleural effusion. There is no focal infiltrate. IMPRESSION: 1. Acute displaced right-sided clavicle fracture. 2. Questionable age-indeterminate fracture of the right fourth rib. No evidence for right-sided pneumothorax. 3. No focal infiltrate.  No significant pleural effusion. Electronically Signed   By: Constance Holster M.D.   On: 07/21/2019 13:42      IMPRESSION AND PLAN:   Laurie Fuentes  is a 85 y.o. female with a known history of prior stroke with no residual deficits, hypertension, GERD currently not on any medications, significant hard of hearing and very poor visual acuity who stays at home with her grandson is brought in secondary to a fall last night and right sided chest pain today.  1.  Subdural hematoma-secondary to a fall today.  Mechanical fall. -Neurosurgery consult, observation. -Follow-up CT head tomorrow.  No blood thinners  2.  Fall and right clavicular fracture-conservative management, in a splint -Orthopedics consulted.  Pain control -Physical therapy consult  3.  Hypertension-secondary to pain.  PRN medications for now and pain controlled  4.  Acute cystitis-follow-up urine cultures.  Patient on Rocephin  5.  DVT prophylaxis-teds and SCDs for now    All the records are reviewed and case discussed with ED provider. Management plans discussed with the patient, family and they are in agreement.  CODE STATUS: Full code  TOTAL TIME TAKING CARE OF THIS PATIENT: 52 minutes.    Gladstone Lighter M.D on 07/21/2019 at 3:10 PM  Between 7am to 6pm - Pager - [phone]  After 6pm go to [email or web address] - Proofreader  [facility]  Office  [phone]  CC: Primary care physician; Juline Patch, MD   Note: This dictation was prepared with Dragon dictation along with smaller phrase technology. Any transcriptional errors that result from this process are unintentional.

## 2019-07-21 NOTE — ED Notes (Signed)
Pt placed on bedpan

## 2019-07-21 NOTE — ED Notes (Signed)
Date and time results received: 07/21/19 12:29 (use smartphrase ".now" to insert current time)  Test: Head CT Critical Value: Subdural Hematoma   Name of Provider Notified: Dr Ellender Hose  Orders Received? Or Actions Taken?: Orders Received - See Orders for details   Primary RN notified.

## 2019-07-21 NOTE — ED Notes (Signed)
Warm blankets provided.

## 2019-07-21 NOTE — ED Triage Notes (Addendum)
Pt is HOH states she got tangled up with her walker last night around 11pm when she went to got the BR and fell injurying her right shoulder pain with noted swelling. States her grandson lives with her.

## 2019-07-21 NOTE — ED Notes (Signed)
COVID swab obtained and sent to lab since pt will be admitted. EDP notified to add order.

## 2019-07-21 NOTE — Consult Note (Signed)
Referring Physician:  No referring provider defined for this encounter.  Primary Physician:  Laurie Patch, MD  Chief Complaint:  fall  History of Present Illness: Laurie Fuentes is a 85 y.o. female who presents as a consult for small left subdural hematoma after experiencing a fall today.  She lives with her grandson and he was present in the room during evaluation and help to obtain history.  Patient is very hard of hearing and she has old hearing aid in.  According to grandson, she is at her baseline mental status without any changes in speech or behavior.  She uses a walker at home to help with ambulation.  She is not on any anticoagulation medications.  Review of Systems:  A 10 point review of systems is negative, except for the pertinent positives and negatives detailed in the HPI.  Past Medical History: Past Medical History:  Diagnosis Date  . GERD (gastroesophageal reflux disease)   . Hypertension   . Hypokalemia   . Stroke [facility])     Past Surgical History: Past Surgical History:  Procedure Laterality Date  . GALLBLADDER SURGERY    . HIP FRACTURE SURGERY Right     Allergies: Allergies as of 07/21/2019  . (No Known Allergies)    Medications:  Current Facility-Administered Medications:  .  carbamide peroxide (DEBROX) 6.5 % otic solution 5 drop, 5 drop, Left EAR, Once, Jones, Deanna C, MD .  cefTRIAXone (ROCEPHIN) 1 g in sodium chloride 0.9 % 100 mL IVPB, 1 g, Intravenous, Once, Duffy Bruce, MD .  potassium chloride 10 mEq in 100 mL IVPB, 10 mEq, Intravenous, Q1 Hr x 2, Duffy Bruce, MD .  potassium chloride SA (K-DUR) CR tablet 40 mEq, 40 mEq, Oral, Once, Duffy Bruce, MD  Current Outpatient Medications:  .  acetaminophen (TYLENOL) 500 MG tablet, Take 500-1,000 mg by mouth every 6 (six) hours as needed for mild pain or fever. , Disp: , Rfl:  .  Melatonin 3 MG TABS, Take 1 tablet by mouth at bedtime., Disp: , Rfl:  .  oxyCODONE (ROXICODONE) 5 MG immediate  release tablet, Take 0.5 tablets (2.5 mg total) by mouth every 6 (six) hours as needed for moderate pain or severe pain. (Patient not taking: Reported on 07/21/2019), Disp: 20 tablet, Rfl: 0 .  ranitidine (ZANTAC) 300 MG tablet, Take 1 tablet (300 mg total) by mouth at bedtime. (Patient not taking: Reported on 01/20/2019), Disp: 30 tablet, Rfl: 5 .  warfarin (COUMADIN) 1 MG tablet, TAKE ONE TABLET BY MOUTH ONCE DAILY USE  AS  DIRECTED (Patient not taking: Reported on 01/20/2019), Disp: 30 tablet, Rfl: 1 .  warfarin (COUMADIN) 2 MG tablet, Take 1 tablet (2 mg total) by mouth daily. (Patient not taking: Reported on 01/20/2019), Disp: 30 tablet, Rfl: 1   Social History: Social History   Tobacco Use  . Smoking status: Never Smoker  . Smokeless tobacco: Never Used  Substance Use Topics  . Alcohol use: No    Alcohol/week: 0.0 standard drinks  . Drug use: No    Family Medical History: Family History  Problem Relation Age of Onset  . Diabetes Sister     Physical Examination: Vitals:   07/21/19 1330 07/21/19 1400  BP: (!) 196/84 (!) 157/70  Pulse: 68 68  Resp: 17 19  Temp:    SpO2: 100% 100%     General: Patient is well developed, well nourished, calm, collected, and in no apparent distress.  Psychiatric: Patient is non-anxious.  Head:  Pupils equal, round, and reactive to light.  Respiratory: Patient is breathing without any difficulty.  Skin:   Scattered ecchymosis around head  NEUROLOGICAL:  General: In no acute distress.   Awake, alert, oriented to person and place.  Pupils equal round and reactive to light.  Facial tone is symmetric.     Strength: Side Biceps Triceps Deltoid Grip  R - - - 4+  L 5 5 - -4+   Side Iliopsoas Quads Hamstring PF DF  R 5 5 5 5 5   L 5 5 5 5 5    Reflexes are 1+ left biceps, triceps, brachioradialis, patella    Bilateral upper and lower extremity sensation is intact to light touch. Clonus is not present.  Hoffman's is  absent.  Imaging: EXAM: CT HEAD WITHOUT CONTRAST 07/21/2019  IMPRESSION: Small subdural hematoma is seen overlying left cerebral convexity. No definite mass effect or midline shift is noted.   Critical Value/emergent results were called by telephone at the time of interpretation on 07/21/2019 at 12:30 pm to providerDr. Ellender Hose, who verbally acknowledged these results.   Assessment and Plan: Laurie Fuentes is a pleasant 85 y.o. female with subdural hematoma sustained after a fall today. Per family member she is at baseline cognition. No concerning findings on physical exam, though it was limited due to patient participation and difficulty hearing. No surgical intervention recommended at this time. Recommend to repeat head CT 6 hours following initial imaging to determine hematoma stability.  (Grandson inquired if she could eat. Per Dr. Izora Ribas - surgery unlikely so eating is fine)  Laurie Olp, PA-C Dept. of Neurosurgery

## 2019-07-22 LAB — CBC
HCT: 30.2 % — ABNORMAL LOW (ref 36.0–46.0)
Hemoglobin: 9.8 g/dL — ABNORMAL LOW (ref 12.0–15.0)
MCH: 31.5 pg (ref 26.0–34.0)
MCHC: 32.5 g/dL (ref 30.0–36.0)
MCV: 97.1 fL (ref 80.0–100.0)
Platelets: 189 10*3/uL (ref 150–400)
RBC: 3.11 MIL/uL — ABNORMAL LOW (ref 3.87–5.11)
RDW: 12.3 % (ref 11.5–15.5)
WBC: 3.7 10*3/uL — ABNORMAL LOW (ref 4.0–10.5)
nRBC: 0 % (ref 0.0–0.2)

## 2019-07-22 LAB — BASIC METABOLIC PANEL
Anion gap: 8 (ref 5–15)
BUN: 11 mg/dL (ref 8–23)
CO2: 26 mmol/L (ref 22–32)
Calcium: 8.8 mg/dL — ABNORMAL LOW (ref 8.9–10.3)
Chloride: 103 mmol/L (ref 98–111)
Creatinine, Ser: 0.43 mg/dL — ABNORMAL LOW (ref 0.44–1.00)
GFR calc Af Amer: >60 mL/min (ref >60)
GFR calc non Af Amer: >60 mL/min (ref >60)
Glucose, Bld: 88 mg/dL (ref 70–99)
Potassium: 3.8 mmol/L (ref 3.5–5.1)
Sodium: 137 mmol/L (ref 135–145)

## 2019-07-22 LAB — SARS CORONAVIRUS 2 (TAT 6-24 HRS): SARS Coronavirus 2: NEGATIVE

## 2019-07-22 NOTE — Progress Notes (Signed)
Spoke to patient's grandson Mr.Vernon.

## 2019-07-22 NOTE — Clinical Social Work Note (Signed)
Tried calling daughter to discuss SNF placement. Voicemail is not set up. Will try again later.  Dayton Scrape, [place]

## 2019-07-22 NOTE — Progress Notes (Signed)
[facility] NAME: Laurie Fuentes    MR#:  [id number]  DATE OF BIRTH:  [birth year]  SUBJECTIVE: Patient is admitted because of fall, suffered subdural hematoma, right clavicle fracture and seen by neurosurgery, orthopedic in both recommended conservative management, patient very confused and very hard of hearing.  Does have hearing aids but according to staff they do not work well.  CHIEF COMPLAINT:   Chief Complaint  Patient presents with  . Fall    REVIEW OF SYSTEMS:   Confused, not able to get review of systems due to confusion DRUG ALLERGIES:  No Known Allergies  VITALS:  Blood pressure (!) 169/75, pulse 73, temperature (!) 97.4 F (36.3 C), temperature source Oral, resp. rate 16, height 5\' 5"  (1.651 m), weight 54.4 kg, SpO2 100 %.  PHYSICAL EXAMINATION:  GENERAL:  85 y.o.-year-old patient lying in the bed with no acute distress.  EYES: Pupils equal, round, reactive to lightNo scleral icterus.   HEENT: Head atraumatic, normocephalic. Oropharynx and nasopharynx clear.  NECK:  Supple, no jugular venous distention. No thyroid enlargement, no tenderness.  LUNGS: Normal breath sounds bilaterally, no wheezing, rales,rhonchi or crepitation. No use of accessory muscles of respiration.  CARDIOVASCULAR: S1, S2 normal. No murmurs, rubs, or gallops.  ABDOMEN: Soft, nontender, nondistended. Bowel sounds present. No organomegaly or mass.  EXTREMITIES: No pedal edema, cyanosis, or clubbing.  NEUROLOGIC: C confused, not able to follow commands for full neuro exam due to underlying cognitive deficit, no gross neurological deficit is observed PSYCHIATRIC: PATIENT IS DISORIENTED.    SKIN: No obvious rash, lesion, or ulcer.  Not keeping her collar as recommended by orthopedic for her clavicle fracture   LABORATORY PANEL:   CBC Recent Labs  Lab 07/22/19 0444  WBC 3.7*  HGB 9.8*  HCT 30.2*  PLT 189    ------------------------------------------------------------------------------------------------------------------  Chemistries  Recent Labs  Lab 07/21/19 1246 07/22/19 0444  NA 138 137  K 2.8* 3.8  CL 97* 103  CO2 31 26  GLUCOSE 100* 88  BUN 17 11  CREATININE 0.61 0.43*  CALCIUM 9.3 8.8*  AST 22  --   ALT 10  --   ALKPHOS 142*  --   BILITOT 0.8  --    ------------------------------------------------------------------------------------------------------------------  Cardiac Enzymes No results for input(s): TROPONINI in the last 168 hours. ------------------------------------------------------------------------------------------------------------------  RADIOLOGY:  Dg Shoulder Right  Result Date: 07/21/2019 CLINICAL DATA:  Right shoulder pain EXAM: RIGHT SHOULDER - 2+ VIEW COMPARISON:  Chest x-ray 05/02/2018 FINDINGS: There is an acute, mildly comminuted fracture of the distal right clavicle. Approximately 7 mm superior displacement of the distal fracture components. Acromioclavicular joint alignment is maintained. Chronic healed fracture deformity of the right humeral head and neck. Glenohumeral joint is aligned without dislocation. Bones are diffusely demineralized. IMPRESSION: Acute, mildly comminuted and displaced fracture of the distal right clavicle. Electronically Signed   By: Davina Poke M.D.   On: 07/21/2019 12:25   Ct Head Wo Contrast  Result Date: 07/21/2019 CLINICAL DATA:  Known subdural hematoma.  Recent fall EXAM: CT HEAD WITHOUT CONTRAST TECHNIQUE: Contiguous axial images were obtained from the base of the skull through the vertex without intravenous contrast. COMPARISON:  July 21, 2019 study obtained earlier in the day. FINDINGS: Brain: Mild diffuse atrophy is stable. The previously noted acute subdural hematoma on the left involving portions of the left temporal and occipital regions is stable with a maximum thickness of 6 mm. There is slight mass effect  on the left temporal and parietal lobes at the site of hematoma, stable. There is no midline shift. There is no new extra-axial fluid. There is small vessel disease in the centra semiovale bilaterally. There is a prior focal infarct involving the posterior left lentiform nucleus extending into the inferior left centrum semiovale, stable. Several prior small lacunar infarcts are noted in the periventricular white matter. No acute infarct is evident. No intra-axial mass or hemorrhage. Vascular: No hyperdense vessel. There is arterial vascular calcification in the distal right vertebral artery and in the carotid siphon regions bilaterally. Skull: There is frontal hyperostosis bilaterally. The bony calvarium appears intact. Sinuses/Orbits: There is slight mucosal thickening in several ethmoid air cells. Other visualized paranasal sinuses are clear. There is leftward deviation of the nasal septum. Orbits appear symmetric bilaterally. Other: Mastoid air cells are clear. IMPRESSION: 1. Stable 6 mm in thickness left temporal and occipital acute subdural hematoma with slight localized mass effect but no midline shift. No progression of this hematoma compared to earlier in the day. No new extra-axial fluid. No intra-axial mass or hemorrhage. 2. Supratentorial small vessel disease. Prior focal infarct involving a portion of the left lentiform nucleus extending into the left centrum semiovale. No acute infarct evident. 3.  Multiple foci of arterial vascular calcification. 4.  Slight mucosal thickening in several ethmoid air cells. Electronically Signed   By: Lowella Grip III M.D.   On: 07/21/2019 15:47   Ct Head Wo Contrast  Result Date: 07/21/2019 CLINICAL DATA:  Fall last night. EXAM: CT HEAD WITHOUT CONTRAST CT CERVICAL SPINE WITHOUT CONTRAST TECHNIQUE: Multidetector CT imaging of the head and cervical spine was performed following the standard protocol without intravenous contrast. Multiplanar CT image  reconstructions of the cervical spine were also generated. COMPARISON:  CT scan of May 02, 2018. FINDINGS: CT HEAD FINDINGS Brain: Mild chronic ischemic white matter disease is noted. Old infarcts are seen involving both basal ganglia. Small subdural hematoma is seen overlying left cerebral convexity with maximum thickness of 6 mm. No significant mass effect or midline shift is noted. Ventricular size is within normal limits. No mass lesion or acute infarction is noted. Vascular: No hyperdense vessel or unexpected calcification. Skull: Normal. Negative for fracture or focal lesion. Sinuses/Orbits: No acute finding. Other: None. CT CERVICAL SPINE FINDINGS Alignment: Normal. Skull base and vertebrae: No acute fracture. No primary bone lesion or focal pathologic process. Soft tissues and spinal canal: No prevertebral fluid or swelling. No visible canal hematoma. Disc levels: Severe degenerative disc disease is noted at C4-5, C5-6 and C6-7 with anterior posterior osteophyte formation Upper chest: Negative. Other: Degenerative changes are seen involving posterior facet joints bilaterally. IMPRESSION: Small subdural hematoma is seen overlying left cerebral convexity. No definite mass effect or midline shift is noted. Critical Value/emergent results were called by telephone at the time of interpretation on 07/21/2019 at 12:30 pm to providerDr. Ellender Hose, who verbally acknowledged these results. Severe multilevel degenerative disc disease. No acute abnormality seen in the cervical spine. Electronically Signed   By: Marijo Conception M.D.   On: 07/21/2019 12:30   Ct Cervical Spine Wo Contrast  Result Date: 07/21/2019 CLINICAL DATA:  Fall last night. EXAM: CT HEAD WITHOUT CONTRAST CT CERVICAL SPINE WITHOUT CONTRAST TECHNIQUE: Multidetector CT imaging of the head and cervical spine was performed following the standard protocol without intravenous contrast. Multiplanar CT image reconstructions of the cervical spine were also  generated. COMPARISON:  CT scan of May 02, 2018. FINDINGS: CT HEAD FINDINGS Brain:  Mild chronic ischemic white matter disease is noted. Old infarcts are seen involving both basal ganglia. Small subdural hematoma is seen overlying left cerebral convexity with maximum thickness of 6 mm. No significant mass effect or midline shift is noted. Ventricular size is within normal limits. No mass lesion or acute infarction is noted. Vascular: No hyperdense vessel or unexpected calcification. Skull: Normal. Negative for fracture or focal lesion. Sinuses/Orbits: No acute finding. Other: None. CT CERVICAL SPINE FINDINGS Alignment: Normal. Skull base and vertebrae: No acute fracture. No primary bone lesion or focal pathologic process. Soft tissues and spinal canal: No prevertebral fluid or swelling. No visible canal hematoma. Disc levels: Severe degenerative disc disease is noted at C4-5, C5-6 and C6-7 with anterior posterior osteophyte formation Upper chest: Negative. Other: Degenerative changes are seen involving posterior facet joints bilaterally. IMPRESSION: Small subdural hematoma is seen overlying left cerebral convexity. No definite mass effect or midline shift is noted. Critical Value/emergent results were called by telephone at the time of interpretation on 07/21/2019 at 12:30 pm to providerDr. Ellender Hose, who verbally acknowledged these results. Severe multilevel degenerative disc disease. No acute abnormality seen in the cervical spine. Electronically Signed   By: Marijo Conception M.D.   On: 07/21/2019 12:30   Dg Chest Portable 1 View  Result Date: 07/21/2019 CLINICAL DATA:  Pain status post fall EXAM: PORTABLE CHEST 1 VIEW COMPARISON:  May 02, 2018 FINDINGS: There is an acute appearing displaced clavicle fracture on the right. There are advanced degenerative changes of both glenohumeral joints. There is a possible age-indeterminate fracture of the right fourth rib. There are advanced degenerative changes of both  glenohumeral joints. The heart size is stable. Aortic calcifications are noted. There is no pneumothorax or significant pleural effusion. There is no focal infiltrate. IMPRESSION: 1. Acute displaced right-sided clavicle fracture. 2. Questionable age-indeterminate fracture of the right fourth rib. No evidence for right-sided pneumothorax. 3. No focal infiltrate.  No significant pleural effusion. Electronically Signed   By: Constance Holster M.D.   On: 07/21/2019 13:42    EKG:   Orders placed or performed during the hospital encounter of 07/21/19  . ED EKG  . ED EKG    ASSESSMENT AND PLAN:   85 year old female patient who lives with grandson brought in because of the fall and found to have right clavicle fracture, subdural hematoma and admitted for the same.  Multiple falls causing right clavicle fracture, subdural hematoma, seen by orthopedic doctor, neurosurgery they both did not recommend any surgical intervention, conservative management, recommended to repeat CAT scan of the head regarding subdural hematoma. 2.  Essential hypertension, continue BP medicines 3.   UTI, on Rocephin follow urine cultures. #4 left temporal lobe, occipital lobe acute subdural hematoma, no progression by repeat CAT scan done yesterday. COVID-19 test is negative.  5. deconditioning, physical therapy consult to see if she needs to go to skilled nursing secondary to multiple falls at home #6 vascular dementia, has good days and bad days #7 hypokalemia, replaced, potassium improved from 2.8-3.8. Number all the records are reviewed and case discussed with Care Management/Social Workerr. Management plans discussed with the patient, family and they are in agreement.  CODE STATUS: Full code  TOTAL TIME TAKING CARE OF THIS PATIENT: 38 minutes.  More than 50% time spent in counseling, coordination of care POSSIBLE D/C IN 1-2  dAYS, DEPENDING ON CLINICAL CONDITION.   Epifanio Lesches M.D on 07/22/2019 at 1:26  PM  Between 7am to 6pm - Pager - [phone]  After 6pm go to [email or web address] - password [email or web address] [facility]  Office  [phone]  CC: Primary care physician; Juline Patch, MD   Note: This dictation was prepared with Dragon dictation along with smaller phrase technology. Any transcriptional errors that result from this process are unintentional.

## 2019-07-22 NOTE — Evaluation (Signed)
Physical Therapy Evaluation Patient Details Name: Laurie Fuentes MRN: [id number] DOB: Dec 10, 1925 Today's Date: 07/22/2019   History of Present Illness  85 y.o. female with a known history of prior stroke with no residual deficits, hypertension, GERD.  Pt has had 3 falls recently, last fall she suffered R clavicular fracture (R UE in immobilizer).   Clinical Impression  Pt unable to state name or hold meaningful conversation or answer history/home questions.  She was distracted t/o the session with IV line, mits, etc and at times was attempting to speak with no clear direction despite inflection and intention made it seem that she knew she wanted to try and say more.  Pt apparently can walk with walker relatively well at home, but per today's performance this is not at all safe and she was unable to tolerate even highly assisted attempt at standing.  Again pt very confused, but still was limited physically too specifically confounded by R clavicular fracture and immobilization.    Follow Up Recommendations SNF;Supervision/Assistance - 24 hour    Equipment Recommendations    none recommended   Recommendations for Other Services       Precautions / Restrictions Precautions Precautions: Fall Restrictions Weight Bearing Restrictions: (R UE in immobilizer, no offical WB orders but de facto NWB)      Mobility  Bed Mobility Overal bed mobility: Needs Assistance Bed Mobility: Sit to Supine;Supine to Sit     Supine to sit: Max assist Sit to supine: Max assist   General bed mobility comments: Pt took some time to understand we were even trying to get to EOB, showed some effort but needed heavy assist with getting to/from EOB  Transfers Overall transfer level: Needs assistance Equipment used: 1 person hand held assist Transfers: Sit to/from Stand Sit to Stand: Max assist         General transfer comment: Pt with retropulsion with attempt to stand.  Even with considerable assist she  was unable to attain full upright  Ambulation/Gait             General Gait Details: unable/unsafe to attempt  Stairs            Wheelchair Mobility    Modified Rankin (Stroke Patients Only)       Balance Overall balance assessment: Needs assistance Sitting-balance support: Single extremity supported Sitting balance-Leahy Scale: Fair(Pt needing constant guard for safety, did maintain sitting)     Standing balance support: Single extremity supported Standing balance-Leahy Scale: Zero Standing balance comment: attempts at standing very limited, unable to shift weight forward                             Pertinent Vitals/Pain Pain Assessment: (unable to rate, indicates pain during transitions)    Home Living Family/patient expects to be discharged to:: Skilled nursing facility Living Arrangements: (lives with grandson)                    Prior Function Level of Independence: Needs assistance   Gait / Transfers Assistance Needed: apparently uses RW nearly independently in the home, has had multiple falls           Hand Dominance        Extremity/Trunk Assessment   Upper Extremity Assessment Upper Extremity Assessment: Difficult to assess due to impaired cognition(R UE in sling/immob)    Lower Extremity Assessment Lower Extremity Assessment: Difficult to assess due to impaired cognition  Communication   Communication: Expressive difficulties;HOH  Cognition Arousal/Alertness: Awake/alert Behavior During Therapy: Flat affect Overall Cognitive Status: Difficult to assess                                 General Comments: per note she had good days and bad days re: mental status, but apparently capible of holding conversation       General Comments      Exercises     Assessment/Plan    PT Assessment Patient needs continued PT services  PT Problem List Decreased strength;Decreased range of  motion;Decreased activity tolerance;Decreased balance;Decreased mobility;Decreased coordination;Decreased knowledge of use of DME;Decreased safety awareness;Decreased cognition       PT Treatment Interventions DME instruction;Gait training;Stair training;Functional mobility training;Therapeutic activities;Therapeutic exercise;Balance training;Patient/family education;Cognitive remediation;Neuromuscular re-education    PT Goals (Current goals can be found in the Care Plan section)  Acute Rehab PT Goals Patient Stated Goal: unable to state PT Goal Formulation: Patient unable to participate in goal setting Time For Goal Achievement: 08/05/19 Potential to Achieve Goals: Fair    Frequency Min 2X/week   Barriers to discharge        Co-evaluation               AM-PAC PT "6 Clicks" Mobility  Outcome Measure Help needed turning from your back to your side while in a flat bed without using bedrails?: A Lot Help needed moving from lying on your back to sitting on the side of a flat bed without using bedrails?: Total Help needed moving to and from a bed to a chair (including a wheelchair)?: Total Help needed standing up from a chair using your arms (e.g., wheelchair or bedside chair)?: Total Help needed to walk in hospital room?: Total Help needed climbing 3-5 steps with a railing? : Total 6 Click Score: 7    End of Session Equipment Utilized During Treatment: Gait belt Activity Tolerance: (limited due to mental status) Patient left: with bed alarm set;with call bell/phone within reach Nurse Communication: Mobility status PT Visit Diagnosis: Muscle weakness (generalized) (M62.81);Difficulty in walking, not elsewhere classified (R26.2);Unsteadiness on feet (R26.81)    Time: [id number] PT Time Calculation (min) (ACUTE ONLY): 18 min   Charges:   PT Evaluation $PT Eval Low Complexity: 1 Low          Kreg Shropshire, DPT 07/22/2019, 1:30 PM

## 2019-07-23 MED ORDER — ORAL CARE MOUTH RINSE
15.0000 mL | Freq: Two times a day (BID) | OROMUCOSAL | Status: DC
  Administered 2019-07-23 – 2019-07-27 (×8): 15 mL via OROMUCOSAL

## 2019-07-23 NOTE — Care Plan (Signed)
PMT note:  Patient is resting in bed with mittens in placed. She is confused and can only tell me "fine" when asked how she is doing. No family at bedside. Attempted to reach next of kin and emergency contact who is her daughter unsuccessfully, as call went straight to VM. Will reattempt GOC on Monday. If patient discharges before Monday, recommend palliative to follow outpatient.

## 2019-07-23 NOTE — Care Management Important Message (Signed)
Important Message  Patient Details  Name: Laurie Fuentes MRN: [id number] Date of Birth: April 30, 1926   Medicare Important Message Given:  Yes  Initial Medicare IM given by Patient Access Associate on 07/22/2019 at 11:12am.  Still valid.   Dannette Barbara 07/23/2019, 8:17 AM

## 2019-07-23 NOTE — NC FL2 (Signed)
[place] LEVEL OF CARE SCREENING TOOL     IDENTIFICATION  Patient Name: Laurie Fuentes Birthdate: February 26, 1926 Sex: female Admission Date (Current Location): 07/21/2019  [facility] and [place] Number:  Engineering geologist and Address:         Provider Number: [id number]  Attending Physician Name and Address:  Epifanio Lesches, MD  Relative Name and Phone Number:       Current Level of Care: Hospital Recommended Level of Care:   Prior Approval Number:    Date Approved/Denied:   PASRR Number: [id number]  Discharge Plan: SNF    Current Diagnoses: Patient Active Problem List   Diagnosis Date Noted  . UTI (urinary tract infection) 07/21/2019  . Rib contusion, left, subsequent encounter 08/08/2017  . Chronic atrial fibrillation 05/28/2016  . Hypokalemia 05/28/2016  . Anticoagulant long-term use 04/02/2016  . Hip fracture requiring operative repair ([place]) 01/07/2016  . Essential hypertension 11/29/2015  . Cerebral vascular disease 11/29/2015  . GERD (gastroesophageal reflux disease) 06/14/2015  . DVT of lower extremity (deep venous thrombosis) ([place]) 06/24/2014    Orientation RESPIRATION BLADDER Height & Weight        Normal Incontinent Weight: 54.4 kg Height:  5\' 5"  (165.1 cm)  BEHAVIORAL SYMPTOMS/MOOD NEUROLOGICAL BOWEL NUTRITION STATUS      Continent Diet(regular)  AMBULATORY STATUS COMMUNICATION OF NEEDS Skin   Extensive Assist Verbally Bruising                       Personal Care Assistance Level of Assistance  Bathing Bathing Assistance: Maximum assistance         Functional Limitations Info  Hearing   Hearing Info: Impaired      SPECIAL CARE FACTORS FREQUENCY  OT (By licensed OT), PT (By licensed PT)                    Contractures Contractures Info: Not present    Additional Factors Info  Code Status, Allergies(hx clavical fx) Code Status Info: Full Allergies Info: NKDA           Current Medications  (07/23/2019):  This is the current hospital active medication list Current Facility-Administered Medications  Medication Dose Route Frequency Provider Last Rate Last Dose  . 0.9 %  sodium chloride infusion   Intravenous Continuous Gladstone Lighter, MD 60 mL/hr at 07/23/19 0406    . acetaminophen (TYLENOL) tablet 650 mg  650 mg Oral Q6H PRN Gladstone Lighter, MD       Or  . acetaminophen (TYLENOL) suppository 650 mg  650 mg Rectal Q6H PRN Gladstone Lighter, MD      . amLODipine (NORVASC) tablet 5 mg  5 mg Oral Daily Gladstone Lighter, MD   5 mg at 07/23/19 1049  . cefTRIAXone (ROCEPHIN) 1 g in sodium chloride 0.9 % 100 mL IVPB  1 g Intravenous Q24H Gladstone Lighter, MD   Stopped at 07/22/19 1347  . labetalol (NORMODYNE) injection 10 mg  10 mg Intravenous Q4H PRN Gladstone Lighter, MD      . Melatonin TABS 5 mg  5 mg Oral QHS PRN Gladstone Lighter, MD   5 mg at 07/23/19 0353  . morphine 2 MG/ML injection 1-2 mg  1-2 mg Intravenous Q4H PRN Gladstone Lighter, MD   1 mg at 07/21/19 1551  . ondansetron (ZOFRAN) tablet 4 mg  4 mg Oral Q6H PRN Gladstone Lighter, MD       Or  . ondansetron [facility]) injection 4 mg  4 mg Intravenous Q6H PRN Gladstone Lighter, MD      . traMADol Veat[facility]) tablet 50 mg  50 mg Oral Q6H PRN Gladstone Lighter, MD         Discharge Medications: Please see discharge summary for a list of discharge medications.  Relevant Imaging Results:  Relevant Lab Results:   Additional Information ss [id number]  Beverly Sessions, RN

## 2019-07-23 NOTE — Progress Notes (Signed)
[place] at [place] NAME: Laurie Fuentes    MR#:  [id number]  DATE OF BIRTH:  [birth year]  SUBJECTIVE: Patient is admitted because of fall, suffered subdural hematoma, right clavicle fracture and seen by neurosurgery, orthopedic in both recommended conservative management, patient very confused and very hard of hearing.  Does have hearing aids but according to staff they do not work well.  Spoke with patient's grandson yesterday, physical therapy is recommending SNF.  CHIEF COMPLAINT:   Chief Complaint  Patient presents with  . Fall    REVIEW OF SYSTEMS:   Confused, not able to get review of systems due to confusion DRUG ALLERGIES:  No Known Allergies  VITALS:  Blood pressure (!) 154/83, pulse 85, temperature 98.3 F (36.8 C), temperature source Oral, resp. rate 20, height 5\' 5"  (1.651 m), weight 54.4 kg, SpO2 100 %.  PHYSICAL EXAMINATION:  GENERAL:  85 y.o.-year-old patient lying in the bed with no acute distress.  EYES: Pupils equal, round, reactive to lightNo scleral icterus.   HEENT: Head atraumatic, normocephalic. Oropharynx and nasopharynx clear.  NECK:  Supple, no jugular venous distention. No thyroid enlargement, no tenderness.  LUNGS: Normal breath sounds bilaterally, no wheezing, rales,rhonchi or crepitation. No use of accessory muscles of respiration.  CARDIOVASCULAR: S1, S2 normal. No murmurs, rubs, or gallops.  ABDOMEN: Soft, nontender, nondistended. Bowel sounds present. No organomegaly or mass.  EXTREMITIES: No pedal edema, cyanosis, or clubbing.  Sling present for right clavicular fracture NEUROLOGIC: confused, not able to follow commands for full neuro exam due to underlying cognitive deficit, no gross neurological deficit is observed PSYCHIATRIC: PATIENT IS DISORIENTED.    SKIN: No obvious rash, lesion, or ulcer.  Not keeping her collar as recommended by orthopedic for her clavicle fracture   LABORATORY PANEL:    CBC Recent Labs  Lab 07/22/19 0444  WBC 3.7*  HGB 9.8*  HCT 30.2*  PLT 189   ------------------------------------------------------------------------------------------------------------------  Chemistries  Recent Labs  Lab 07/21/19 1246 07/22/19 0444  NA 138 137  K 2.8* 3.8  CL 97* 103  CO2 31 26  GLUCOSE 100* 88  BUN 17 11  CREATININE 0.61 0.43*  CALCIUM 9.3 8.8*  AST 22  --   ALT 10  --   ALKPHOS 142*  --   BILITOT 0.8  --    ------------------------------------------------------------------------------------------------------------------  Cardiac Enzymes No results for input(s): TROPONINI in the last 168 hours. ------------------------------------------------------------------------------------------------------------------  RADIOLOGY:  Ct Head Wo Contrast  Result Date: 07/21/2019 CLINICAL DATA:  Known subdural hematoma.  Recent fall EXAM: CT HEAD WITHOUT CONTRAST TECHNIQUE: Contiguous axial images were obtained from the base of the skull through the vertex without intravenous contrast. COMPARISON:  July 21, 2019 study obtained earlier in the day. FINDINGS: Brain: Mild diffuse atrophy is stable. The previously noted acute subdural hematoma on the left involving portions of the left temporal and occipital regions is stable with a maximum thickness of 6 mm. There is slight mass effect on the left temporal and parietal lobes at the site of hematoma, stable. There is no midline shift. There is no new extra-axial fluid. There is small vessel disease in the centra semiovale bilaterally. There is a prior focal infarct involving the posterior left lentiform nucleus extending into the inferior left centrum semiovale, stable. Several prior small lacunar infarcts are noted in the periventricular white matter. No acute infarct is evident. No intra-axial mass or hemorrhage. Vascular: No hyperdense vessel. There is arterial vascular calcification in  the distal right vertebral  artery and in the carotid siphon regions bilaterally. Skull: There is frontal hyperostosis bilaterally. The bony calvarium appears intact. Sinuses/Orbits: There is slight mucosal thickening in several ethmoid air cells. Other visualized paranasal sinuses are clear. There is leftward deviation of the nasal septum. Orbits appear symmetric bilaterally. Other: Mastoid air cells are clear. IMPRESSION: 1. Stable 6 mm in thickness left temporal and occipital acute subdural hematoma with slight localized mass effect but no midline shift. No progression of this hematoma compared to earlier in the day. No new extra-axial fluid. No intra-axial mass or hemorrhage. 2. Supratentorial small vessel disease. Prior focal infarct involving a portion of the left lentiform nucleus extending into the left centrum semiovale. No acute infarct evident. 3.  Multiple foci of arterial vascular calcification. 4.  Slight mucosal thickening in several ethmoid air cells. Electronically Signed   By: Lowella Grip III M.D.   On: 07/21/2019 15:47   Dg Chest Portable 1 View  Result Date: 07/21/2019 CLINICAL DATA:  Pain status post fall EXAM: PORTABLE CHEST 1 VIEW COMPARISON:  May 02, 2018 FINDINGS: There is an acute appearing displaced clavicle fracture on the right. There are advanced degenerative changes of both glenohumeral joints. There is a possible age-indeterminate fracture of the right fourth rib. There are advanced degenerative changes of both glenohumeral joints. The heart size is stable. Aortic calcifications are noted. There is no pneumothorax or significant pleural effusion. There is no focal infiltrate. IMPRESSION: 1. Acute displaced right-sided clavicle fracture. 2. Questionable age-indeterminate fracture of the right fourth rib. No evidence for right-sided pneumothorax. 3. No focal infiltrate.  No significant pleural effusion. Electronically Signed   By: Constance Holster M.D.   On: 07/21/2019 13:42    EKG:   Orders  placed or performed during the hospital encounter of 07/21/19  . ED EKG  . ED EKG    ASSESSMENT AND PLAN:   85 year old female patient who lives with grandson brought in because of the fall and found to have right clavicle fracture, subdural hematoma and admitted for the same.  Multiple falls causing right clavicle fracture, subdural hematoma, seen by orthopedic doctor, neurosurgery they both did not recommend any surgical intervention, conservative management, for subdural hematoma neurosurgery recommended to repeat CT head which was repeated and CT head repeat did not show any progression of subdural hematoma.  2.  Essential hypertension, continue BP medicines 3.   E. coli UTI, patient is on Rocephin, follow full cultures and sensitivity results. #4/ left temporal lobe, occipital lobe acute subdural hematoma, no progression by repeat CAT scan done yesterday. COVID-19 test is negative.  5.  Multiple falls, advanced dementia, physical therapy recommends SNF placement, spoke with patient's grandson yesterday he is not sure about placement.  Consult palliative care because of advanced dementia, falls, UTI this time  #6 .vascular dementia, has good days and bad days   #7 .hypokalemia, replaced, potassium improved from 2.8-3.8.   Number all the records are reviewed and case discussed with Care Management/Social Workerr. Management plans discussed with the patient, family and they are in agreement.  CODE STATUS: Full code  TOTAL TIME TAKING CARE OF THIS PATIENT: 38 minutes.  More than 50% time spent in counseling, coordination of care POSSIBLE D/C IN 1-2  dAYS, DEPENDING ON CLINICAL CONDITION.   Epifanio Lesches M.D on 07/23/2019 at 12:58 PM  Between 7am to 6pm - Pager - [phone]  After 6pm go to [email or web address] - password [email or web address] [facility]  Office  [phone]  CC: Primary care physician; Juline Patch, MD   Note: This dictation was prepared with  Dragon dictation along with smaller phrase technology. Any transcriptional errors that result from this process are unintentional.

## 2019-07-23 NOTE — TOC Initial Note (Signed)
Transition of Care [facility]) - Initial/Assessment Note    Patient Details  Name: Laurie Fuentes MRN: [id number] Date of Birth: 1925-11-15  [facility] CM/SW Contact:    Beverly Sessions, RN Phone Number: 07/23/2019, 4:24 PM  Clinical Narrative:                 Patient admitted from home with UTI Patient lives in her own home.  Granddaughter is home during the day [phone].  Her grandson lives in the home, but he works and there are periods of time when the patient is alone.  PCP [place] takes patient to appointments  States patient has a BSC,  RW, cane, WC in the home.    Has previously had home health services but does not recall the name of the agency.  Has previously been at [facility] for SNF   RNCM spoke with daughter [name] regarding discharge planning. PT has assessed patient and recommends SNF.  She is agreeable.  Bed search completed. [name] selects [facility]  Accepted in the [place].  Debra with [facility] notified.  If patient is ready for discharge over the weekend, she states it is dependent upon the medications she will need at discharge.  She request to be called if patient is ready for discharge over then weekend.   Expected Discharge Plan: [place]     Patient Goals and CMS Choice        Expected Discharge Plan and Services Expected Discharge Plan: [place]       Living arrangements for the past 2 months: Single Family Home                                      Prior Living Arrangements/Services Living arrangements for the past 2 months: Single Family Home Lives with:: Adult Children, Relatives Patient language and need for interpreter reviewed:: Yes            Current home services: DME    Activities of Daily Living Home Assistive Devices/Equipment: Environmental consultant (specify type) ADL Screening (condition at time of admission) Patient's cognitive ability  adequate to safely complete daily activities?: Yes(son at bedside) Is the patient deaf or have difficulty hearing?: Yes Does the patient have difficulty seeing, even when wearing glasses/contacts?: Yes Does the patient have difficulty concentrating, remembering, or making decisions?: Yes Patient able to express need for assistance with ADLs?: Yes Does the patient have difficulty dressing or bathing?: No Independently performs ADLs?: Yes (appropriate for developmental age) Does the patient have difficulty walking or climbing stairs?: Yes(uses walker) Weakness of Legs: Both Weakness of Arms/Hands: Right  Permission Sought/Granted                  Emotional Assessment       Orientation: : Fluctuating Orientation (Suspected and/or reported [employer])      Admission diagnosis:  Hypokalemia [E87.6] Lower urinary tract infectious disease [N39.0] Subdural hematoma ([place]) [S06.5X9A] Fall, initial encounter [W19.XXXA] Closed displaced fracture of shaft of right clavicle, initial encounter [S42.021A] Patient Active Problem List   Diagnosis Date Noted  . UTI (urinary tract infection) 07/21/2019  . Rib contusion, left, subsequent encounter 08/08/2017  . Chronic atrial fibrillation 05/28/2016  . Hypokalemia 05/28/2016  . Anticoagulant long-term use 04/02/2016  . Hip fracture requiring operative repair ([place]) 01/07/2016  . Essential hypertension 11/29/2015  .  Cerebral vascular disease 11/29/2015  . GERD (gastroesophageal reflux disease) 06/14/2015  . DVT of lower extremity (deep venous thrombosis) ([place]) 06/24/2014   PCP:  Juline Patch, MD Pharmacy:   [facility] [address] [place] [address] Phone: [phone] Fax: [phone]     Social Determinants of Health (SDOH) Interventions    Readmission Risk Interventions No flowsheet data found.

## 2019-07-24 MED ORDER — ENSURE ENLIVE PO LIQD
237.0000 mL | Freq: Two times a day (BID) | ORAL | Status: DC
  Administered 2019-07-24 – 2019-07-27 (×7): 237 mL via ORAL

## 2019-07-24 MED ORDER — SENNA 8.6 MG PO TABS
1.0000 | ORAL_TABLET | Freq: Every day | ORAL | Status: DC
  Administered 2019-07-24 – 2019-07-27 (×4): 8.6 mg via ORAL
  Filled 2019-07-24 (×4): qty 1

## 2019-07-24 MED ORDER — MEGESTROL ACETATE 400 MG/10ML PO SUSP
800.0000 mg | Freq: Every day | ORAL | Status: DC
  Administered 2019-07-24 – 2019-07-27 (×4): 800 mg via ORAL
  Filled 2019-07-24 (×4): qty 20

## 2019-07-24 MED ORDER — DOCUSATE SODIUM 100 MG PO CAPS
200.0000 mg | ORAL_CAPSULE | Freq: Two times a day (BID) | ORAL | Status: DC
  Administered 2019-07-24 – 2019-07-27 (×5): 200 mg via ORAL
  Filled 2019-07-24 (×6): qty 2

## 2019-07-24 MED ORDER — MODAFINIL 100 MG PO TABS
100.0000 mg | ORAL_TABLET | Freq: Every day | ORAL | Status: DC
  Administered 2019-07-24 – 2019-07-27 (×4): 100 mg via ORAL
  Filled 2019-07-24 (×4): qty 1

## 2019-07-24 NOTE — Progress Notes (Signed)
[place] at [place] NAME: Laurie Fuentes    MR#:  [id number]  DATE OF BIRTH:  11/27/1925  SUBJECTIVE: Patient is admitted because of fall, suffered subdural hematoma, right clavicle fracture and seen by neurosurgery, orthopedic in both recommended conservative management, patient very confused and very hard of hearing.  Does have hearing aids but according to staff they do not work well.  Spoke with patient's grandson yesterday, physical therapy is recommending SNF.  CHIEF COMPLAINT:   Chief Complaint  Patient presents with  . Fall    REVIEW OF SYSTEMS:   Patient currently sleepy in the morning   DRUG ALLERGIES:  No Known Allergies  VITALS:  Blood pressure (!) 155/78, pulse 69, temperature 97.7 F (36.5 C), temperature source Oral, resp. rate 16, height 5\' 5"  (1.651 m), weight 54.4 kg, SpO2 100 %.  PHYSICAL EXAMINATION:  GENERAL:  85 y.o.-year-old patient lying in the bed with no acute distress.  EYES: Pupils equal, round, reactive to lightNo scleral icterus.   HEENT: Head atraumatic, normocephalic. Oropharynx and nasopharynx clear.  NECK:  Supple, no jugular venous distention. No thyroid enlargement, no tenderness.  LUNGS: Normal breath sounds bilaterally, no wheezing, rales,rhonchi or crepitation. No use of accessory muscles of respiration.  CARDIOVASCULAR: S1, S2 normal. No murmurs, rubs, or gallops.  ABDOMEN: Soft, nontender, nondistended. Bowel sounds present. No organomegaly or mass.  EXTREMITIES: No pedal edema, cyanosis, or clubbing.  Sling present for right clavicular fracture NEUROLOGIC: Sleepy PSYCHIATRIC: Sleepy  SKIN: No obvious rash, lesion, or ulcer.  Not keeping her collar as recommended by orthopedic for her clavicle fracture   LABORATORY PANEL:   CBC Recent Labs  Lab 07/22/19 0444  WBC 3.7*  HGB 9.8*  HCT 30.2*  PLT 189    ------------------------------------------------------------------------------------------------------------------  Chemistries  Recent Labs  Lab 07/21/19 1246 07/22/19 0444  NA 138 137  K 2.8* 3.8  CL 97* 103  CO2 31 26  GLUCOSE 100* 88  BUN 17 11  CREATININE 0.61 0.43*  CALCIUM 9.3 8.8*  AST 22  --   ALT 10  --   ALKPHOS 142*  --   BILITOT 0.8  --    ------------------------------------------------------------------------------------------------------------------  Cardiac Enzymes No results for input(s): TROPONINI in the last 168 hours. ------------------------------------------------------------------------------------------------------------------  RADIOLOGY:  No results found.  EKG:   Orders placed or performed during the hospital encounter of 07/21/19  . ED EKG  . ED EKG    ASSESSMENT AND PLAN:   85 year old female patient who lives with grandson brought in because of the fall and found to have right clavicle fracture, subdural hematoma and admitted for the same.  # 1. Multiple falls causing right clavicle fracture, subdural hematoma, seen by orthopedic doctor, neurosurgery they both did not recommend any surgical intervention, conservative management, for subdural hematoma neurosurgery recommended to repeat CT head which was repeated and CT head repeat did not show any progression of subdural hematoma.  #2.  Essential hypertension, continue BP medicines  #3.   E. coli UTI, change to oral antibiotics  #4 left temporal lobe, occipital lobe acute subdural hematoma, no progression by repeat CAT scan done yesterday. COVID-19 test is negative.  #5 Multiple falls, advanced dementia, physical therapy recommends SNF placement, spoke with patient's grandson yesterday he is not sure about placement.  Consult palliative care because of advanced dementia, falls, UTI this time  #6 .vascular dementia, has good days and bad days   #7 hypokalemia, replaced,   CODE  STATUS: Full code  TOTAL TIME TAKING CARE OF THIS PATIENT: 74min  More than 50% time spent in counseling, coordination of care POSSIBLE D/C IN 1-2  dAYS, DEPENDING ON CLINICAL CONDITION.   Dustin Flock M.D on 07/24/2019 at 12:24 PM  Between 7am to 6pm - Pager - [phone]  After 6pm go to [email or web address] - password [email or web address] [facility]  Office  [phone]  CC: Primary care physician; Juline Patch, MD   Note: This dictation was prepared with Dragon dictation along with smaller phrase technology. Any transcriptional errors that result from this process are unintentional.

## 2019-07-24 NOTE — Plan of Care (Signed)
Patient has done ok today.  She has been sleepy but a little more awake this evening.  Showing no signs of pain.  Voiding.  NO BM documented since admission, started laxative today.  No significant changes.  Continue to monitor.

## 2019-07-25 LAB — URINE CULTURE: Culture: >=100000 — AB

## 2019-07-25 MED ORDER — QUETIAPINE FUMARATE 25 MG PO TABS
12.5000 mg | ORAL_TABLET | Freq: Every day | ORAL | Status: DC
  Administered 2019-07-25: 22:00:00 12.5 mg via ORAL
  Filled 2019-07-25: qty 1

## 2019-07-25 MED ORDER — CEFDINIR 300 MG PO CAPS
300.0000 mg | ORAL_CAPSULE | Freq: Two times a day (BID) | ORAL | Status: DC
  Administered 2019-07-25 – 2019-07-27 (×5): 300 mg via ORAL
  Filled 2019-07-25 (×7): qty 1

## 2019-07-25 NOTE — Progress Notes (Signed)
[place] at [place] NAME: Laurie Fuentes    MR#:  [id number]  DATE OF BIRTH:  Nov 20, 1925  SUBJECTIVE: Patient is admitted because of fall, suffered subdural hematoma, right clavicle fracture and seen by neurosurgery, orthopedic in both recommended conservative management, patient very confused and very hard of hearing.  Does have hearing aids but according to staff they do not work well.  Spoke with patient's grandson yesterday, physical therapy is recommending SNF.  CHIEF COMPLAINT:   Chief Complaint  Patient presents with  . Fall    REVIEW OF SYSTEMS:   Patient little more awake today   DRUG ALLERGIES:  No Known Allergies  VITALS:  Blood pressure (!) 155/68, pulse 80, temperature 98 F (36.7 C), temperature source Oral, resp. rate 18, height 5\' 5"  (1.651 m), weight 54.4 kg, SpO2 99 %.  PHYSICAL EXAMINATION:  GENERAL:  85 y.o.-year-old patient lying in the bed with no acute distress.  EYES: Pupils equal, round, reactive to lightNo scleral icterus.   HEENT: Head atraumatic, normocephalic. Oropharynx and nasopharynx clear.  NECK:  Supple, no jugular venous distention. No thyroid enlargement, no tenderness.  LUNGS: Normal breath sounds bilaterally, no wheezing, rales,rhonchi or crepitation. No use of accessory muscles of respiration.  CARDIOVASCULAR: S1, S2 normal. No murmurs, rubs, or gallops.  ABDOMEN: Soft, nontender, nondistended. Bowel sounds present. No organomegaly or mass.  EXTREMITIES: No pedal edema, cyanosis, or clubbing.  Sling present for right clavicular fracture NEUROLOGIC: Sleepy PSYCHIATRIC: Sleepy  SKIN: No obvious rash, lesion, or ulcer.  Not keeping her collar as recommended by orthopedic for her clavicle fracture   LABORATORY PANEL:   CBC Recent Labs  Lab 07/22/19 0444  WBC 3.7*  HGB 9.8*  HCT 30.2*  PLT 189    ------------------------------------------------------------------------------------------------------------------  Chemistries  Recent Labs  Lab 07/21/19 1246 07/22/19 0444  NA 138 137  K 2.8* 3.8  CL 97* 103  CO2 31 26  GLUCOSE 100* 88  BUN 17 11  CREATININE 0.61 0.43*  CALCIUM 9.3 8.8*  AST 22  --   ALT 10  --   ALKPHOS 142*  --   BILITOT 0.8  --    ------------------------------------------------------------------------------------------------------------------  Cardiac Enzymes No results for input(s): TROPONINI in the last 168 hours. ------------------------------------------------------------------------------------------------------------------  RADIOLOGY:  No results found.  EKG:   Orders placed or performed during the hospital encounter of 07/21/19  . ED EKG  . ED EKG    ASSESSMENT AND PLAN:   85 year old female patient who lives with grandson brought in because of the fall and found to have right clavicle fracture, subdural hematoma and admitted for the same.  # 1. Multiple falls causing right clavicle fracture, subdural hematoma, seen by orthopedic doctor, neurosurgery they both did not recommend any surgical intervention, conservative management, for subdural hematoma neurosurgery recommended to repeat CT head which was repeated and CT head repeat did not show any progression of subdural hematoma. Due to patient being drowsy I will have added Provigil yesterday and will add Seroquel for nighttime  #2.  Essential hypertension, continue BP medicines  #3.   E. coli UTI, change to [facility]  #4 left temporal lobe, occipital lobe acute subdural hematoma, no progression by repeat CAT scan done yesterday. COVID-19 test is negative.  #5 Multiple falls, advanced dementia, physical therapy recommends  Discussed with patient's daughter she states that she is okay with her going to rehab  #6 .vascular dementia, has good days and bad days   #  7 hypokalemia,  replaced,   CODE STATUS: Full code  TOTAL TIME TAKING CARE OF THIS PATIENT: 89min  More than 50% time spent in counseling, coordination of care POSSIBLE D/C IN 1-2  dAYS, DEPENDING ON CLINICAL CONDITION.   Laurie Fuentes M.D on 07/25/2019 at 11:42 AM  Between 7am to 6pm - Pager - [phone]  After 6pm go to [email or web address] - password [email or web address] [facility]  Office  [phone]  CC: Primary care physician; Laurie Patch, MD   Note: This dictation was prepared with Dragon dictation along with smaller phrase technology. Any transcriptional errors that result from this process are unintentional.

## 2019-07-25 NOTE — Progress Notes (Addendum)
Advanced care plan.  Purpose of the Encounter: CODE STATUS  Parties in Attendance: Patient herself  Patient's Decision Capacity: Intact  Subjective/Patient's story: Laurie Fuentes  is a 85 y.o. female with a known history of prior stroke with no residual deficits, hypertension, GERD currently not on any medications, significant hard of hearing and very poor visual acuity who stays at home with her grandson is brought in secondary to a fall last night and right chest pain today.  Patient noted to have subdural hematoma.   Objective/Medical story I discussed with the patient daughter regarding overall poor prognosis and mother's advanced age recommended to do a DNR status.       Goals of care determination:  Patient's daughter states that she has other family members she would like to discuss this with.   CODE STATUS: Full code for now recommended DNR status   Time spent discussing advanced care planning: 16 minutes

## 2019-07-26 DIAGNOSIS — N390 Urinary tract infection, site not specified: Secondary | ICD-10-CM

## 2019-07-26 DIAGNOSIS — Z7189 Other specified counseling: Secondary | ICD-10-CM

## 2019-07-26 DIAGNOSIS — S42021A Displaced fracture of shaft of right clavicle, initial encounter for closed fracture: Secondary | ICD-10-CM

## 2019-07-26 DIAGNOSIS — S065XAA Traumatic subdural hemorrhage with loss of consciousness status unknown, initial encounter: Secondary | ICD-10-CM

## 2019-07-26 DIAGNOSIS — W19XXXA Unspecified fall, initial encounter: Secondary | ICD-10-CM

## 2019-07-26 DIAGNOSIS — Z515 Encounter for palliative care: Secondary | ICD-10-CM

## 2019-07-26 DIAGNOSIS — S065X9A Traumatic subdural hemorrhage with loss of consciousness of unspecified duration, initial encounter: Principal | ICD-10-CM

## 2019-07-26 NOTE — Consult Note (Signed)
Consultation Note Date: 07/26/2019   Patient Name: Laurie Fuentes  DOB: 04-16-1926  MRN: [id number]  Age / Sex: 85 y.o., female  PCP: Juline Patch, MD Referring Physician: Dustin Flock, MD  Reason for Consultation: Establishing goals of care  HPI/Patient Profile: 85 y.o. female  with past medical history of CVA, HTN, GERD admitted on 07/21/2019 with fall. Patient found to have subdural hematoma seen by neurosurgery with recommendation for conservative management. Repeat CT head stable. Also with right clavicle fracture seen by ortho recommending shoulder immobilizer for 3 weeks and outpatient palliative. E.coli UTI receiving Omnicef. Underlying vascular dementia. Plan for SNF for rehab when stable. Palliative medicine consultation for goals of care.   Clinical Assessment and Goals of Care:  I have reviewed medical records, discussed with care team, and assessed the patient. [name] wakes slowly to voice. She is very hard of hearing but has one hearing aid in place. She remains confused and disoriented to place/situation. Denies pain. No s/s of distress or discomfort. No family at bedside.    Shortly after, spoke with daughter Laurie Fuentes) via telephone. Laurie Fuentes has a cold and is not appropriate for her to visit the hospital at this time. Discussed GOC with Laurie Fuentes via telephone.   Introduced Palliative Medicine as specialized medical care for people living with serious illness. It focuses on providing relief from the symptoms and stress of a serious illness. The goal is to improve quality of life for both the patient and the family.  We discussed a brief life review of the patient. Prior to admission, patient living in her home. Laurie Fuentes lives with her. She ambulates with walker but unfortunately having frequent falls. She often can recognize family but has intermittent confusion "good days and bad days."  Laurie Fuentes reports she has not been technically diagnosed with dementia.   Discussed events leading up to admission and course of hospitalization including diagnoses, interventions, and plan of care including conservative management following the fall and neurosurgery/ortho recommendations.   Laurie Fuentes speaks of plan to pursue SNF placement. She is hopeful her mother will be able to participate with therapy with hopes of getting back home. Emphasized the need for 24 hour care.   I attempted to elicit values and goals of care important to the patient and family. Advanced directives, concepts specific to code status, artifical feeding and hydration were discussed. Patient does NOT have a documented living will. Laurie Fuentes is only living child, therefore HCPOA defaults to [place]. Explored Brenda's thoughts on resuscitation and educated her on what this would look like. Educated on medical recommendation for DNR with her mother's underlying age, frailty, and fear this would cause pain/suffering at EOL.   Laurie Fuentes states it is a "hard decision to make" and requests time to think about this decision. "I need to think about that." Introduced concept of MOST form in order to document wishes against heroic measures including resuscitation and life support but also her wishes in regards to IVF, ABX, and artificial nutrition for her mother. Treating the treatable.  Questions and concerns were addressed. PMT contact information given.     SUMMARY OF RECOMMENDATIONS    Continue FULL code/FULL scope treatment. Introduced MOST form and discussed code status with daughter. Educated on medical recommendation for DNR. Daughter requests time to think and consider this decision. PMT contact information given.  Continue current plan of care and interventions. Plan is for SNF for rehab. Recommend outpatient palliative referral at SNF. PMT will continue to follow inpatient.  Code Status/Advance Care Planning:  Full  code  Symptom Management:   Per attending  Palliative Prophylaxis:   Aspiration, Delirium Protocol, Frequent Pain Assessment, Oral Care and Turn Reposition  Psycho-social/Spiritual:   Desire for further Chaplaincy support: yes  Additional Recommendations: Caregiving  Support/Resources and Education on Hospice  Prognosis:   Poor long-term prognosis  Discharge Planning: [place] for rehab with Palliative care service follow-up      Primary Diagnoses: Present on Admission:  UTI (urinary tract infection)   I have reviewed the medical record, interviewed the patient and family, and examined the patient. The following aspects are pertinent.  Past Medical History:  Diagnosis Date   GERD (gastroesophageal reflux disease)    Hypertension    Hypokalemia    Stroke [facility])    Social History   Socioeconomic History   Marital status: Widowed    Spouse name: Not on file   Number of children: Not on file   Years of education: Not on file   Highest education level: Not on file  Occupational History   Not on file  Social Needs   Financial resource strain: Not on file   Food insecurity    Worry: Not on file    Inability: Not on file   Transportation needs    Medical: Not on file    Non-medical: Not on file  Tobacco Use   Smoking status: Never Smoker   Smokeless tobacco: Never Used  Substance and Sexual Activity   Alcohol use: No    Alcohol/week: 0.0 standard drinks   Drug use: No   Sexual activity: Never  Lifestyle   Physical activity    Days per week: Not on file    Minutes per session: Not on file   Stress: Not on file  Relationships   Social connections    Talks on phone: Not on file    Gets together: Not on file    Attends religious service: Not on file    Active member of club or organization: Not on file    Attends meetings of clubs or organizations: Not on file    Relationship status: Not on file  Other Topics Concern    Not on file  Social History Narrative   Lives at home with her grandson.  Has a walker.   Family History  Problem Relation Age of Onset   Diabetes Sister    Scheduled Meds:  amLODipine  5 mg Oral Daily   cefdinir  300 mg Oral Q12H   docusate sodium  200 mg Oral BID   feeding supplement (ENSURE ENLIVE)  237 mL Oral BID BM   mouth rinse  15 mL Mouth Rinse BID   megestrol  800 mg Oral Daily   modafinil  100 mg Oral Daily   senna  1 tablet Oral Daily   Continuous Infusions:  sodium chloride 60 mL/hr at 07/26/19 0557   PRN Meds:.acetaminophen **OR** acetaminophen, labetalol, Melatonin, ondansetron **OR** ondansetron (ZOFRAN) IV, traMADol Medications Prior to Admission:  Prior to Admission medications  Medication Sig Start Date End Date Taking? Authorizing Provider  acetaminophen (TYLENOL) 500 MG tablet Take 500-1,000 mg by mouth every 6 (six) hours as needed for mild pain or fever.     [provider]  Melatonin 3 MG TABS Take 1 tablet by mouth at bedtime.    [provider]  oxyCODONE (ROXICODONE) 5 MG immediate release tablet Take 0.5 tablets (2.5 mg total) by mouth every 6 (six) hours as needed for moderate pain or severe pain. Patient not taking: Reported on 07/21/2019 01/20/19 01/20/20  Rudene Re, MD  ranitidine (ZANTAC) 300 MG tablet Take 1 tablet (300 mg total) by mouth at bedtime. Patient not taking: Reported on 01/20/2019 08/10/18   Juline Patch, MD  warfarin (COUMADIN) 1 MG tablet TAKE ONE TABLET BY MOUTH ONCE DAILY USE  AS  DIRECTED Patient not taking: Reported on 01/20/2019 08/13/18   Juline Patch, MD  warfarin (COUMADIN) 2 MG tablet Take 1 tablet (2 mg total) by mouth daily. Patient not taking: Reported on 01/20/2019 08/13/18   Juline Patch, MD   No Known Allergies Review of Systems  Unable to perform ROS: Mental status change   Physical Exam Vitals signs and nursing note reviewed.  Constitutional:      Appearance: She is  cachectic. She is ill-appearing.  HENT:     Head: Normocephalic and atraumatic.  Cardiovascular:     Rate and Rhythm: Normal rate.  Pulmonary:     Effort: No tachypnea, accessory muscle usage or respiratory distress.  Skin:    General: Skin is warm and dry.     Coloration: Skin is pale.  Neurological:     Mental Status: She is easily aroused.     Comments: Opens eyes to voice, HOH, pleasant confusion  Psychiatric:        Attention and Perception: She is inattentive.        Cognition and Memory: Cognition is impaired.    Vital Signs: BP (!) 158/64 (BP Location: Left Arm)    Pulse 75    Temp 98.4 F (36.9 C) (Oral)    Resp 16    Ht 5\' 5"  (1.651 m)    Wt 54.4 kg    SpO2 (!) 88%    BMI 19.97 kg/m  Pain Scale: Faces POSS *See Group Information*: 1-Acceptable,Awake and alert Pain Score: 0-No pain   SpO2: SpO2: (!) 88 % O2 Device:SpO2: (!) 88 % O2 Flow Rate: .   IO: Intake/output summary:   Intake/Output Summary (Last 24 hours) at 07/26/2019 1118 Last data filed at 07/26/2019 1022 Gross per 24 hour  Intake 1725.93 ml  Output --  Net 1725.93 ml    LBM: Last BM Date: (unknown; pt unable to answer) Baseline Weight: Weight: 54.4 kg Most recent weight: Weight: 54.4 kg     Palliative Assessment/Data: PPS 40%     Time In/Out: 1100-1140 Time Total: 40 Greater than 50%  of this time was spent counseling and coordinating care related to the above assessment and plan.  Signed by:  Ihor Dow, DNP, FNP-C Palliative Medicine Team  Phone: [phone] Fax: [phone]   Please contact Palliative Medicine Team phone at [phone] for questions and concerns.  For individual provider: See Shea Evans

## 2019-07-26 NOTE — Progress Notes (Signed)
New referral for out patient Palliative to follow post discharge received from [facility]. Plan is for discharge to SNF for short term rehab. Patient information given to referral. Flo Shanks BSN, RN, [place] [phone]

## 2019-07-26 NOTE — Progress Notes (Signed)
[place] [facility] [place] NAME: Laurie Fuentes    MR#:  [id number]  DATE OF BIRTH:  1925/12/20  SUBJECTIVE: Patient is admitted because of fall, suffered subdural hematoma, right clavicle fracture and seen by neurosurgery, orthopedic in both recommended conservative management, patient very confused and very hard of hearing.  Does have hearing aids but according to staff they do not work well.  Spoke with patient's grandson yesterday, physical therapy is recommending SNF.  CHIEF COMPLAINT:   Chief Complaint  Patient presents with  . Fall    REVIEW OF SYSTEMS:   Patient very drowsy   DRUG ALLERGIES:  No Known Allergies  VITALS:  Blood pressure (!) 150/55, pulse 91, temperature 97.9 F (36.6 C), resp. rate 18, height 5\' 5"  (1.651 m), weight 54.4 kg, SpO2 100 %.  PHYSICAL EXAMINATION:  GENERAL:  85 y.o.-year-old patient lying in the bed with no acute distress.  EYES: Pupils equal, round, reactive to lightNo scleral icterus.   HEENT: Head atraumatic, normocephalic. Oropharynx and nasopharynx clear.  NECK:  Supple, no jugular venous distention. No thyroid enlargement, no tenderness.  LUNGS: Normal breath sounds bilaterally, no wheezing, rales,rhonchi or crepitation. No use of accessory muscles of respiration.  CARDIOVASCULAR: S1, S2 normal. No murmurs, rubs, or gallops.  ABDOMEN: Soft, nontender, nondistended. Bowel sounds present. No organomegaly or mass.  EXTREMITIES: No pedal edema, cyanosis, or clubbing.  Sling present for right clavicular fracture NEUROLOGIC: Sleepy PSYCHIATRIC: Sleepy  SKIN: No obvious rash, lesion, or ulcer.  Not keeping her collar as recommended by orthopedic for her clavicle fracture   LABORATORY PANEL:   CBC Recent Labs  Lab 07/22/19 0444  WBC 3.7*  HGB 9.8*  HCT 30.2*  PLT 189    ------------------------------------------------------------------------------------------------------------------  Chemistries  Recent Labs  Lab 07/21/19 1246 07/22/19 0444  NA 138 137  K 2.8* 3.8  CL 97* 103  CO2 31 26  GLUCOSE 100* 88  BUN 17 11  CREATININE 0.61 0.43*  CALCIUM 9.3 8.8*  AST 22  --   ALT 10  --   ALKPHOS 142*  --   BILITOT 0.8  --    ------------------------------------------------------------------------------------------------------------------  Cardiac Enzymes No results for input(s): TROPONINI in the last 168 hours. ------------------------------------------------------------------------------------------------------------------  RADIOLOGY:  No results found.  EKG:   Orders placed or performed during the hospital encounter of 07/21/19  . ED EKG  . ED EKG    ASSESSMENT AND PLAN:   85 year old female patient who lives with grandson brought in because of the fall and found to have right clavicle fracture, subdural hematoma and admitted for the same.  # 1. Multiple falls causing right clavicle fracture, subdural hematoma, seen by orthopedic doctor, neurosurgery they both did not recommend any surgical intervention, conservative management, for subdural hematoma neurosurgery recommended to repeat CT head which was repeated and CT head repeat did not show any progression of subdural hematoma. Patient drowsy I will discontinue the Seroquel continue Provigil for now  #2.  Essential hypertension, continue BP medicines  #3.   E. coli UTI, continue Omnicef  #4 left temporal lobe, occipital lobe acute subdural hematoma, no progression by repeat CAT scan done yesterday. COVID-19 test is negative.  #5 Multiple falls, advanced dementia, physical therapy recommends  Discussed with patient's daughter she states that she is okay with her going to rehab  #6 .vascular dementia, has good days and bad days   #7 hypokalemia, replaced,   CODE STATUS: Full  code  TOTAL  TIME TAKING CARE OF THIS PATIENT: 57min  More than 50% time spent in counseling, coordination of care POSSIBLE D/C IN 1-2  dAYS, DEPENDING ON CLINICAL CONDITION.   Dustin Flock M.D on 07/26/2019 at 1:38 PM  Between 7am to 6pm - Pager - [phone]  After 6pm go to [email or web address] - password [email or web address] [facility]  Office  [phone]  CC: Primary care physician; Juline Patch, MD   Note: This dictation was prepared with Dragon dictation along with smaller phrase technology. Any transcriptional errors that result from this process are unintentional.

## 2019-07-27 DIAGNOSIS — I2510 Atherosclerotic heart disease of native coronary artery without angina pectoris: Secondary | ICD-10-CM | POA: Diagnosis not present

## 2019-07-27 DIAGNOSIS — S065X9A Traumatic subdural hemorrhage with loss of consciousness of unspecified duration, initial encounter: Secondary | ICD-10-CM | POA: Diagnosis not present

## 2019-07-27 DIAGNOSIS — S42009A Fracture of unspecified part of unspecified clavicle, initial encounter for closed fracture: Secondary | ICD-10-CM | POA: Diagnosis not present

## 2019-07-27 DIAGNOSIS — I82409 Acute embolism and thrombosis of unspecified deep veins of unspecified lower extremity: Secondary | ICD-10-CM | POA: Diagnosis not present

## 2019-07-27 DIAGNOSIS — I10 Essential (primary) hypertension: Secondary | ICD-10-CM | POA: Diagnosis not present

## 2019-07-27 DIAGNOSIS — R5381 Other malaise: Secondary | ICD-10-CM | POA: Diagnosis not present

## 2019-07-27 DIAGNOSIS — S065X9D Traumatic subdural hemorrhage with loss of consciousness of unspecified duration, subsequent encounter: Secondary | ICD-10-CM | POA: Diagnosis not present

## 2019-07-27 DIAGNOSIS — I480 Paroxysmal atrial fibrillation: Secondary | ICD-10-CM | POA: Diagnosis not present

## 2019-07-27 DIAGNOSIS — R404 Transient alteration of awareness: Secondary | ICD-10-CM | POA: Diagnosis not present

## 2019-07-27 DIAGNOSIS — B9620 Unspecified Escherichia coli [E. coli] as the cause of diseases classified elsewhere: Secondary | ICD-10-CM | POA: Diagnosis not present

## 2019-07-27 DIAGNOSIS — E440 Moderate protein-calorie malnutrition: Secondary | ICD-10-CM | POA: Diagnosis not present

## 2019-07-27 DIAGNOSIS — M6281 Muscle weakness (generalized): Secondary | ICD-10-CM | POA: Diagnosis not present

## 2019-07-27 DIAGNOSIS — Z7401 Bed confinement status: Secondary | ICD-10-CM | POA: Diagnosis not present

## 2019-07-27 DIAGNOSIS — N390 Urinary tract infection, site not specified: Secondary | ICD-10-CM | POA: Diagnosis not present

## 2019-07-27 DIAGNOSIS — K219 Gastro-esophageal reflux disease without esophagitis: Secondary | ICD-10-CM | POA: Diagnosis not present

## 2019-07-27 DIAGNOSIS — S42001A Fracture of unspecified part of right clavicle, initial encounter for closed fracture: Secondary | ICD-10-CM | POA: Diagnosis not present

## 2019-07-27 DIAGNOSIS — F0150 Vascular dementia without behavioral disturbance: Secondary | ICD-10-CM | POA: Diagnosis not present

## 2019-07-27 DIAGNOSIS — I6389 Other cerebral infarction: Secondary | ICD-10-CM | POA: Diagnosis not present

## 2019-07-27 DIAGNOSIS — R41841 Cognitive communication deficit: Secondary | ICD-10-CM | POA: Diagnosis not present

## 2019-07-27 DIAGNOSIS — S42021D Displaced fracture of shaft of right clavicle, subsequent encounter for fracture with routine healing: Secondary | ICD-10-CM | POA: Diagnosis not present

## 2019-07-27 DIAGNOSIS — R1311 Dysphagia, oral phase: Secondary | ICD-10-CM | POA: Diagnosis not present

## 2019-07-27 DIAGNOSIS — E876 Hypokalemia: Secondary | ICD-10-CM | POA: Diagnosis not present

## 2019-07-27 DIAGNOSIS — M2550 Pain in unspecified joint: Secondary | ICD-10-CM | POA: Diagnosis not present

## 2019-07-27 LAB — SARS CORONAVIRUS 2 BY RT PCR (HOSPITAL ORDER, PERFORMED IN ~~LOC~~ HOSPITAL LAB): SARS Coronavirus 2: NEGATIVE

## 2019-07-27 LAB — GLUCOSE, CAPILLARY: Glucose-Capillary: 86 mg/dL (ref 70–99)

## 2019-07-27 MED ORDER — AMLODIPINE BESYLATE 5 MG PO TABS: 5.0000 mg | ORAL_TABLET | Freq: Every day | ORAL | 0 refills | Status: DC

## 2019-07-27 MED ORDER — MODAFINIL 100 MG PO TABS: 100.0000 mg | ORAL_TABLET | Freq: Every day | ORAL | Status: DC

## 2019-07-27 MED ORDER — ACETAMINOPHEN 325 MG PO TABS: 650.0000 mg | ORAL_TABLET | Freq: Four times a day (QID) | ORAL | Status: AC | PRN

## 2019-07-27 MED ORDER — CEFDINIR 300 MG PO CAPS: 300.0000 mg | ORAL_CAPSULE | Freq: Two times a day (BID) | ORAL | 0 refills | Status: AC

## 2019-07-27 MED ORDER — MEGESTROL ACETATE 400 MG/10ML PO SUSP: 800.0000 mg | Freq: Every day | ORAL | 0 refills | Status: DC

## 2019-07-27 MED ORDER — ENSURE ENLIVE PO LIQD: 237.0000 mL | Freq: Two times a day (BID) | ORAL | 12 refills | Status: AC

## 2019-07-27 MED ORDER — DOCUSATE SODIUM 100 MG PO CAPS: 200.0000 mg | ORAL_CAPSULE | Freq: Two times a day (BID) | ORAL | 0 refills | Status: DC

## 2019-07-27 NOTE — Discharge Summary (Signed)
[place] at [facility], 85 y.o., DOB [birth year], MRN [id number]. Admission date: 07/21/2019 Discharge Date 07/27/2019 Primary MD Juline Patch, MD Admitting Physician Gladstone Lighter, MD  Admission Diagnosis  Hypokalemia [E87.6] Lower urinary tract infectious disease [N39.0] Subdural hematoma ([place]) [S06.5X9A] Fall, initial encounter [W19.XXXA] Closed displaced fracture of shaft of right clavicle, initial encounter [S42.021A]  Discharge Diagnosis   Active Problems: Multiple falls causing right clavicular fracture And urinary tract infection due to E. Coli In left temporal lobe occipital lobe acute subdural hematoma Vascular dementia Essential hypertension Hypokalemia Left distal clavicular fracture    Hospital Course  85 year old female patient who lives with grandson brought in because of the fall and found to have right clavicle fracture, subdural hematoma and admitted for the same.  Patient was admitted and treated with antibiotics for E. coli UTI.  She was also seen by by orthopedics who recommended sling and immobilizer to the left shoulder for next 3 weeks with outpatient follow-up.  By neurosurgery who recommended no further surgical intervention.  Recommend monitoring patient.  Due to dementia patient has been sleepy during hospitalization but does wake and able to eat.  She was also seen by palliative care.     Palliative care team to follow patient at the facility overall poor prognosis.   Omnicef for 4 more days      Consults  orthopedic surgery, neurosurgery  Significant Tests:  See full reports for all details     Dg Shoulder Right  Result Date: 07/21/2019 CLINICAL DATA:  Right shoulder pain EXAM: RIGHT SHOULDER - 2+ VIEW COMPARISON:  Chest x-ray 05/02/2018 FINDINGS: There is an acute, mildly comminuted fracture of the distal right clavicle. Approximately 7 mm superior displacement of the distal fracture  components. Acromioclavicular joint alignment is maintained. Chronic healed fracture deformity of the right humeral head and neck. Glenohumeral joint is aligned without dislocation. Bones are diffusely demineralized. IMPRESSION: Acute, mildly comminuted and displaced fracture of the distal right clavicle. Electronically Signed   By: Davina Poke M.D.   On: 07/21/2019 12:25   Ct Head Wo Contrast  Result Date: 07/21/2019 CLINICAL DATA:  Known subdural hematoma.  Recent fall EXAM: CT HEAD WITHOUT CONTRAST TECHNIQUE: Contiguous axial images were obtained from the base of the skull through the vertex without intravenous contrast. COMPARISON:  July 21, 2019 study obtained earlier in the day. FINDINGS: Brain: Mild diffuse atrophy is stable. The previously noted acute subdural hematoma on the left involving portions of the left temporal and occipital regions is stable with a maximum thickness of 6 mm. There is slight mass effect on the left temporal and parietal lobes at the site of hematoma, stable. There is no midline shift. There is no new extra-axial fluid. There is small vessel disease in the centra semiovale bilaterally. There is a prior focal infarct involving the posterior left lentiform nucleus extending into the inferior left centrum semiovale, stable. Several prior small lacunar infarcts are noted in the periventricular white matter. No acute infarct is evident. No intra-axial mass or hemorrhage. Vascular: No hyperdense vessel. There is arterial vascular calcification in the distal right vertebral artery and in the carotid siphon regions bilaterally. Skull: There is frontal hyperostosis bilaterally. The bony calvarium appears intact. Sinuses/Orbits: There is slight mucosal thickening in several ethmoid air cells. Other visualized paranasal sinuses are clear. There is leftward deviation of the nasal septum. Orbits appear symmetric bilaterally. Other: Mastoid air cells are clear. IMPRESSION: 1. Stable  6  mm in thickness left temporal and occipital acute subdural hematoma with slight localized mass effect but no midline shift. No progression of this hematoma compared to earlier in the day. No new extra-axial fluid. No intra-axial mass or hemorrhage. 2. Supratentorial small vessel disease. Prior focal infarct involving a portion of the left lentiform nucleus extending into the left centrum semiovale. No acute infarct evident. 3.  Multiple foci of arterial vascular calcification. 4.  Slight mucosal thickening in several ethmoid air cells. Electronically Signed   By: Lowella Grip III M.D.   On: 07/21/2019 15:47   Ct Head Wo Contrast  Result Date: 07/21/2019 CLINICAL DATA:  Fall last night. EXAM: CT HEAD WITHOUT CONTRAST CT CERVICAL SPINE WITHOUT CONTRAST TECHNIQUE: Multidetector CT imaging of the head and cervical spine was performed following the standard protocol without intravenous contrast. Multiplanar CT image reconstructions of the cervical spine were also generated. COMPARISON:  CT scan of May 02, 2018. FINDINGS: CT HEAD FINDINGS Brain: Mild chronic ischemic white matter disease is noted. Old infarcts are seen involving both basal ganglia. Small subdural hematoma is seen overlying left cerebral convexity with maximum thickness of 6 mm. No significant mass effect or midline shift is noted. Ventricular size is within normal limits. No mass lesion or acute infarction is noted. Vascular: No hyperdense vessel or unexpected calcification. Skull: Normal. Negative for fracture or focal lesion. Sinuses/Orbits: No acute finding. Other: None. CT CERVICAL SPINE FINDINGS Alignment: Normal. Skull base and vertebrae: No acute fracture. No primary bone lesion or focal pathologic process. Soft tissues and spinal canal: No prevertebral fluid or swelling. No visible canal hematoma. Disc levels: Severe degenerative disc disease is noted at C4-5, C5-6 and C6-7 with anterior posterior osteophyte formation Upper chest:  Negative. Other: Degenerative changes are seen involving posterior facet joints bilaterally. IMPRESSION: Small subdural hematoma is seen overlying left cerebral convexity. No definite mass effect or midline shift is noted. Critical Value/emergent results were called by telephone at the time of interpretation on 07/21/2019 at 12:30 pm to providerDr. Ellender Hose, who verbally acknowledged these results. Severe multilevel degenerative disc disease. No acute abnormality seen in the cervical spine. Electronically Signed   By: Marijo Conception M.D.   On: 07/21/2019 12:30   Ct Cervical Spine Wo Contrast  Result Date: 07/21/2019 CLINICAL DATA:  Fall last night. EXAM: CT HEAD WITHOUT CONTRAST CT CERVICAL SPINE WITHOUT CONTRAST TECHNIQUE: Multidetector CT imaging of the head and cervical spine was performed following the standard protocol without intravenous contrast. Multiplanar CT image reconstructions of the cervical spine were also generated. COMPARISON:  CT scan of May 02, 2018. FINDINGS: CT HEAD FINDINGS Brain: Mild chronic ischemic white matter disease is noted. Old infarcts are seen involving both basal ganglia. Small subdural hematoma is seen overlying left cerebral convexity with maximum thickness of 6 mm. No significant mass effect or midline shift is noted. Ventricular size is within normal limits. No mass lesion or acute infarction is noted. Vascular: No hyperdense vessel or unexpected calcification. Skull: Normal. Negative for fracture or focal lesion. Sinuses/Orbits: No acute finding. Other: None. CT CERVICAL SPINE FINDINGS Alignment: Normal. Skull base and vertebrae: No acute fracture. No primary bone lesion or focal pathologic process. Soft tissues and spinal canal: No prevertebral fluid or swelling. No visible canal hematoma. Disc levels: Severe degenerative disc disease is noted at C4-5, C5-6 and C6-7 with anterior posterior osteophyte formation Upper chest: Negative. Other: Degenerative changes are seen  involving posterior facet joints bilaterally. IMPRESSION: Small subdural hematoma is seen overlying  left cerebral convexity. No definite mass effect or midline shift is noted. Critical Value/emergent results were called by telephone at the time of interpretation on 07/21/2019 at 12:30 pm to providerDr. Ellender Hose, who verbally acknowledged these results. Severe multilevel degenerative disc disease. No acute abnormality seen in the cervical spine. Electronically Signed   By: Marijo Conception M.D.   On: 07/21/2019 12:30   Dg Chest Portable 1 View  Result Date: 07/21/2019 CLINICAL DATA:  Pain status post fall EXAM: PORTABLE CHEST 1 VIEW COMPARISON:  May 02, 2018 FINDINGS: There is an acute appearing displaced clavicle fracture on the right. There are advanced degenerative changes of both glenohumeral joints. There is a possible age-indeterminate fracture of the right fourth rib. There are advanced degenerative changes of both glenohumeral joints. The heart size is stable. Aortic calcifications are noted. There is no pneumothorax or significant pleural effusion. There is no focal infiltrate. IMPRESSION: 1. Acute displaced right-sided clavicle fracture. 2. Questionable age-indeterminate fracture of the right fourth rib. No evidence for right-sided pneumothorax. 3. No focal infiltrate.  No significant pleural effusion. Electronically Signed   By: Constance Holster M.D.   On: 07/21/2019 13:42       Today   Subjective:   Laurie Fuentes patient awakens to stimulus  Objective:   Blood pressure (!) 118/51, pulse 72, temperature 98.2 F (36.8 C), temperature source Oral, resp. rate 16, height 5\' 5"  (1.651 m), weight 54.4 kg, SpO2 99 %.  .  Intake/Output Summary (Last 24 hours) at 07/27/2019 0943 Last data filed at 07/26/2019 2234 Gross per 24 hour  Intake 1416.62 ml  Output 550 ml  Net 866.62 ml    Exam VITAL SIGNS: Blood pressure (!) 118/51, pulse 72, temperature 98.2 F (36.8 C), temperature source Oral,  resp. rate 16, height 5\' 5"  (1.651 m), weight 54.4 kg, SpO2 99 %.  GENERAL:  85 y.o.-year-old patient lying in the bed with no acute distress.  EYES: Pupils equal, round, reactive to light and accommodation. No scleral icterus. Extraocular muscles intact.  HEENT: Head atraumatic, normocephalic. Oropharynx and nasopharynx clear.  NECK:  Supple, no jugular venous distention. No thyroid enlargement, no tenderness.  LUNGS: Normal breath sounds bilaterally, no wheezing, rales,rhonchi or crepitation. No use of accessory muscles of respiration.  CARDIOVASCULAR: S1, S2 normal. No murmurs, rubs, or gallops.  ABDOMEN: Soft, nontender, nondistended. Bowel sounds present. No organomegaly or mass.  EXTREMITIES: No pedal edema, cyanosis, or clubbing.  NEUROLOGIC: Patient wakes up not able to follow commands completely PSYCHIATRIC: The patient is able to wake up  SKIN: No obvious rash, lesion, or ulcer.   Data Review     CBC w Diff:  Lab Results  Component Value Date   WBC 3.7 (L) 07/22/2019   HGB 9.8 (L) 07/22/2019   HGB 12.5 06/27/2014   HCT 30.2 (L) 07/22/2019   HCT 39.1 06/27/2014   PLT 189 07/22/2019   PLT 269 06/27/2014   LYMPHOPCT 12 07/21/2019   LYMPHOPCT 20.3 06/27/2014   MONOPCT 7 07/21/2019   MONOPCT 9.0 06/27/2014   EOSPCT 0 07/21/2019   EOSPCT 3.4 06/27/2014   BASOPCT 1 07/21/2019   BASOPCT 1.0 06/27/2014   CMP:  Lab Results  Component Value Date   NA 137 07/22/2019   NA 135 11/25/2017   NA 139 06/27/2014   K 3.8 07/22/2019   K 3.9 06/27/2014   CL 103 07/22/2019   CL 98 06/27/2014   CO2 26 07/22/2019   CO2 30 06/27/2014   BUN 11  07/22/2019   BUN 14 11/25/2017   BUN 19 (H) 06/27/2014   CREATININE 0.43 (L) 07/22/2019   CREATININE 0.84 06/27/2014   PROT 7.2 07/21/2019   ALBUMIN 3.7 07/21/2019   ALBUMIN 4.3 11/25/2017   BILITOT 0.8 07/21/2019   ALKPHOS 142 (H) 07/21/2019   AST 22 07/21/2019   ALT 10 07/21/2019  .  Micro Results Recent Results (from the past  240 hour(s))  Urine culture     Status: Abnormal   Collection Time: 07/21/19  1:30 PM   Specimen: Urine, Clean Catch  Result Value Ref Range Status   Specimen Description   Final    URINE, CLEAN CATCH Performed at [facility], [address], [place], [place] [address]    Special Requests   Final    NONE Performed at [facility], [place], [place] [address]    Culture >=100,000 COLONIES/mL ESCHERICHIA COLI (A)  Final   Report Status 07/25/2019 FINAL  Final   Organism ID, Bacteria ESCHERICHIA COLI (A)  Final      Susceptibility   Escherichia coli - MIC*    AMPICILLIN <=2 SENSITIVE Sensitive     CEFAZOLIN <=4 SENSITIVE Sensitive     CEFTRIAXONE <=1 SENSITIVE Sensitive     CIPROFLOXACIN <=0.25 SENSITIVE Sensitive     GENTAMICIN <=1 SENSITIVE Sensitive     IMIPENEM <=0.25 SENSITIVE Sensitive     NITROFURANTOIN <=16 SENSITIVE Sensitive     TRIMETH/SULFA <=20 SENSITIVE Sensitive     AMPICILLIN/SULBACTAM <=2 SENSITIVE Sensitive     PIP/TAZO <=4 SENSITIVE Sensitive     Extended ESBL NEGATIVE Sensitive     * >=100,000 COLONIES/mL ESCHERICHIA COLI  SARS CORONAVIRUS 2 (TAT 6-24 HRS) Nasopharyngeal Nasopharyngeal Swab     Status: None   Collection Time: 07/21/19  2:19 PM   Specimen: Nasopharyngeal Swab  Result Value Ref Range Status   SARS Coronavirus 2 NEGATIVE NEGATIVE Final    Comment: (NOTE) SARS-CoV-2 target nucleic acids are NOT DETECTED. The SARS-CoV-2 RNA is generally detectable in upper and lower respiratory specimens during the acute phase of infection. Negative results do not preclude SARS-CoV-2 infection, do not rule out co-infections with other pathogens, and should not be used as the sole basis for treatment or other patient management decisions. Negative results must be combined with clinical observations, patient history, and epidemiological information. The expected result is Negative. Fact Sheet for  Patients: SugarRoll.be Fact Sheet for Healthcare Providers: https://www.woods-mathews.com/ This test is not yet approved or cleared by the [place] FDA and  has been authorized for detection and/or diagnosis of SARS-CoV-2 by FDA under an Emergency Use Authorization (EUA). This EUA will remain  in effect (meaning this test can be used) for the duration of the COVID-19 declaration under Section 56 4(b)(1) of the Act, 21 U.S.C. section 360bbb-3(b)(1), unless the authorization is terminated or revoked sooner. Performed at [facility], [place] [address] [place], [place] [address]         Code Status Orders  (From admission, onward)         Start     Ordered   07/21/19 1552  Full code  Continuous     07/21/19 1551        Code Status History    This patient has a current code status but no historical code status.   Advance Care Planning Activity           Contact information for follow-up providers    Otilio Miu  C, MD Follow up in 2 week(s).   Specialty: Family Medicine Contact information: [address] [place] [address] [phone]            Contact information for after-discharge care    Destination    [facility] .   Service: Skilled Nursing Contact information: [address] [place] [phone]                  Discharge Medications   Allergies as of 07/27/2019   No Known Allergies     Medication List    STOP taking these medications   oxyCODONE 5 MG immediate release tablet Commonly known as: Roxicodone   ranitidine 300 MG tablet Commonly known as: ZANTAC   warfarin 1 MG tablet Commonly known as: COUMADIN   warfarin 2 MG tablet Commonly known as: COUMADIN     TAKE these medications   acetaminophen 500 MG tablet Commonly known as: TYLENOL Take 500-1,000 mg by mouth every 6 (six) hours as needed  for mild pain or fever. What changed: Another medication with the same name was added. Make sure you understand how and when to take each.   acetaminophen 325 MG tablet Commonly known as: TYLENOL Take 2 tablets (650 mg total) by mouth every 6 (six) hours as needed for up to 5 days for mild pain (or Fever >/= 101). What changed: You were already taking a medication with the same name, and this prescription was added. Make sure you understand how and when to take each.   amLODipine 5 MG tablet Commonly known as: NORVASC Take 1 tablet (5 mg total) by mouth daily.   cefdinir 300 MG capsule Commonly known as: OMNICEF Take 1 capsule (300 mg total) by mouth every 12 (twelve) hours for 4 days.   docusate sodium 100 MG capsule Commonly known as: COLACE Take 2 capsules (200 mg total) by mouth 2 (two) times daily.   feeding supplement (ENSURE ENLIVE) Liqd Take 237 mLs by mouth 2 (two) times daily between meals.   megestrol 400 MG/10ML suspension Commonly known as: MEGACE Take 20 mLs (800 mg total) by mouth daily.   Melatonin 3 MG Tabs Take 1 tablet by mouth at bedtime.   modafinil 100 MG tablet Commonly known as: PROVIGIL Take 1 tablet (100 mg total) by mouth daily.          Total Time in preparing paper work, data evaluation and todays exam - 88 minutes  Dustin Flock M.D on 07/27/2019 at 9:43 AM [place]  [phone]

## 2019-07-27 NOTE — Progress Notes (Addendum)
Called report to [facility]. Spoke with Claiborne Billings LPN. Questions asked and answered. Denies further questions. EMS called for transport.

## 2019-07-27 NOTE — TOC Transition Note (Signed)
Transition of Care [facility]) - CM/SW Discharge Note   Patient Details  Name: Laurie Fuentes MRN: [id number] Date of Birth: [birth year]  Transition of Care [facility] CM/SW Contact:  Beverly Sessions, RN Phone Number: 07/27/2019, 10:52 AM   Clinical Narrative:    Patient to [facility]   Debra from [facility] aware, sent discharge information in the [facility]  EMS packet on chart.   Patient can discharge after covid results return  Outpatient palliative referral made to [facility]    Final next level of care: [place] Barriers to Discharge: No Barriers Identified   Patient Goals and CMS Choice        Discharge Placement              Patient chooses bed at: [facility] Patient to be transferred to facility by: EMS Name of family member notified: MD contacte daughter [name] Patient and family notified of of transfer: 07/27/19  Discharge Plan and Services                                     Social Determinants of Health (SDOH) Interventions     Readmission Risk Interventions No flowsheet data found.

## 2019-07-29 DIAGNOSIS — E876 Hypokalemia: Secondary | ICD-10-CM | POA: Diagnosis not present

## 2019-07-29 DIAGNOSIS — N390 Urinary tract infection, site not specified: Secondary | ICD-10-CM | POA: Diagnosis not present

## 2019-07-29 DIAGNOSIS — R5381 Other malaise: Secondary | ICD-10-CM | POA: Diagnosis not present

## 2019-07-29 DIAGNOSIS — I82409 Acute embolism and thrombosis of unspecified deep veins of unspecified lower extremity: Secondary | ICD-10-CM | POA: Diagnosis not present

## 2019-07-29 DIAGNOSIS — S42001A Fracture of unspecified part of right clavicle, initial encounter for closed fracture: Secondary | ICD-10-CM | POA: Diagnosis not present

## 2019-07-29 DIAGNOSIS — I480 Paroxysmal atrial fibrillation: Secondary | ICD-10-CM | POA: Diagnosis not present

## 2019-07-29 DIAGNOSIS — I2510 Atherosclerotic heart disease of native coronary artery without angina pectoris: Secondary | ICD-10-CM | POA: Diagnosis not present

## 2019-07-29 DIAGNOSIS — F0150 Vascular dementia without behavioral disturbance: Secondary | ICD-10-CM | POA: Diagnosis not present

## 2019-07-29 DIAGNOSIS — K219 Gastro-esophageal reflux disease without esophagitis: Secondary | ICD-10-CM | POA: Diagnosis not present

## 2019-07-29 DIAGNOSIS — S065X9A Traumatic subdural hemorrhage with loss of consciousness of unspecified duration, initial encounter: Secondary | ICD-10-CM | POA: Diagnosis not present

## 2019-07-29 DIAGNOSIS — I6389 Other cerebral infarction: Secondary | ICD-10-CM | POA: Diagnosis not present

## 2019-07-29 DIAGNOSIS — I10 Essential (primary) hypertension: Secondary | ICD-10-CM | POA: Diagnosis not present

## 2019-08-03 DIAGNOSIS — F0150 Vascular dementia without behavioral disturbance: Secondary | ICD-10-CM | POA: Diagnosis not present

## 2019-08-03 DIAGNOSIS — S42001A Fracture of unspecified part of right clavicle, initial encounter for closed fracture: Secondary | ICD-10-CM | POA: Diagnosis not present

## 2019-08-03 DIAGNOSIS — I10 Essential (primary) hypertension: Secondary | ICD-10-CM | POA: Diagnosis not present

## 2019-08-03 DIAGNOSIS — I480 Paroxysmal atrial fibrillation: Secondary | ICD-10-CM | POA: Diagnosis not present

## 2019-08-03 DIAGNOSIS — E440 Moderate protein-calorie malnutrition: Secondary | ICD-10-CM | POA: Diagnosis not present

## 2019-08-03 DIAGNOSIS — N390 Urinary tract infection, site not specified: Secondary | ICD-10-CM | POA: Diagnosis not present

## 2019-08-05 ENCOUNTER — Telehealth: Payer: Self-pay

## 2019-08-05 NOTE — Telephone Encounter (Signed)
SW contacted [name] (patient's daughter) to discuss palliative care referral and get consent for RN/SW visit. [name] in agreement. SW provided palliative care phone number for [name] in case she has any questions/concerns.  SW contacted Neoma Laming at [facility] and she confirmed with DON that RN/SW could visit with PPE and screening completed at the door. Neoma Laming said team could visit during business hours. SW to coordinate with RN.

## 2019-08-06 ENCOUNTER — Other Ambulatory Visit: Payer: Self-pay

## 2019-08-06 ENCOUNTER — Other Ambulatory Visit: Payer: Medicare Other

## 2019-08-06 ENCOUNTER — Non-Acute Institutional Stay: Payer: Medicare Other

## 2019-08-06 DIAGNOSIS — Z515 Encounter for palliative care: Secondary | ICD-10-CM

## 2019-08-06 IMAGING — CR DG HIP (WITH OR WITHOUT PELVIS) 2-3V*R*
1 series · 3 of 3 positions shown · non-contrast
Comparison: 05/11/2015

CLINICAL DATA: Right hip pain

EXAM:
DG HIP (WITH OR WITHOUT PELVIS) 2-3V RIGHT

[Series 1: t pelvis ap · 0.14mm/px · 3 of 3 slices shown]
[im 1/3]
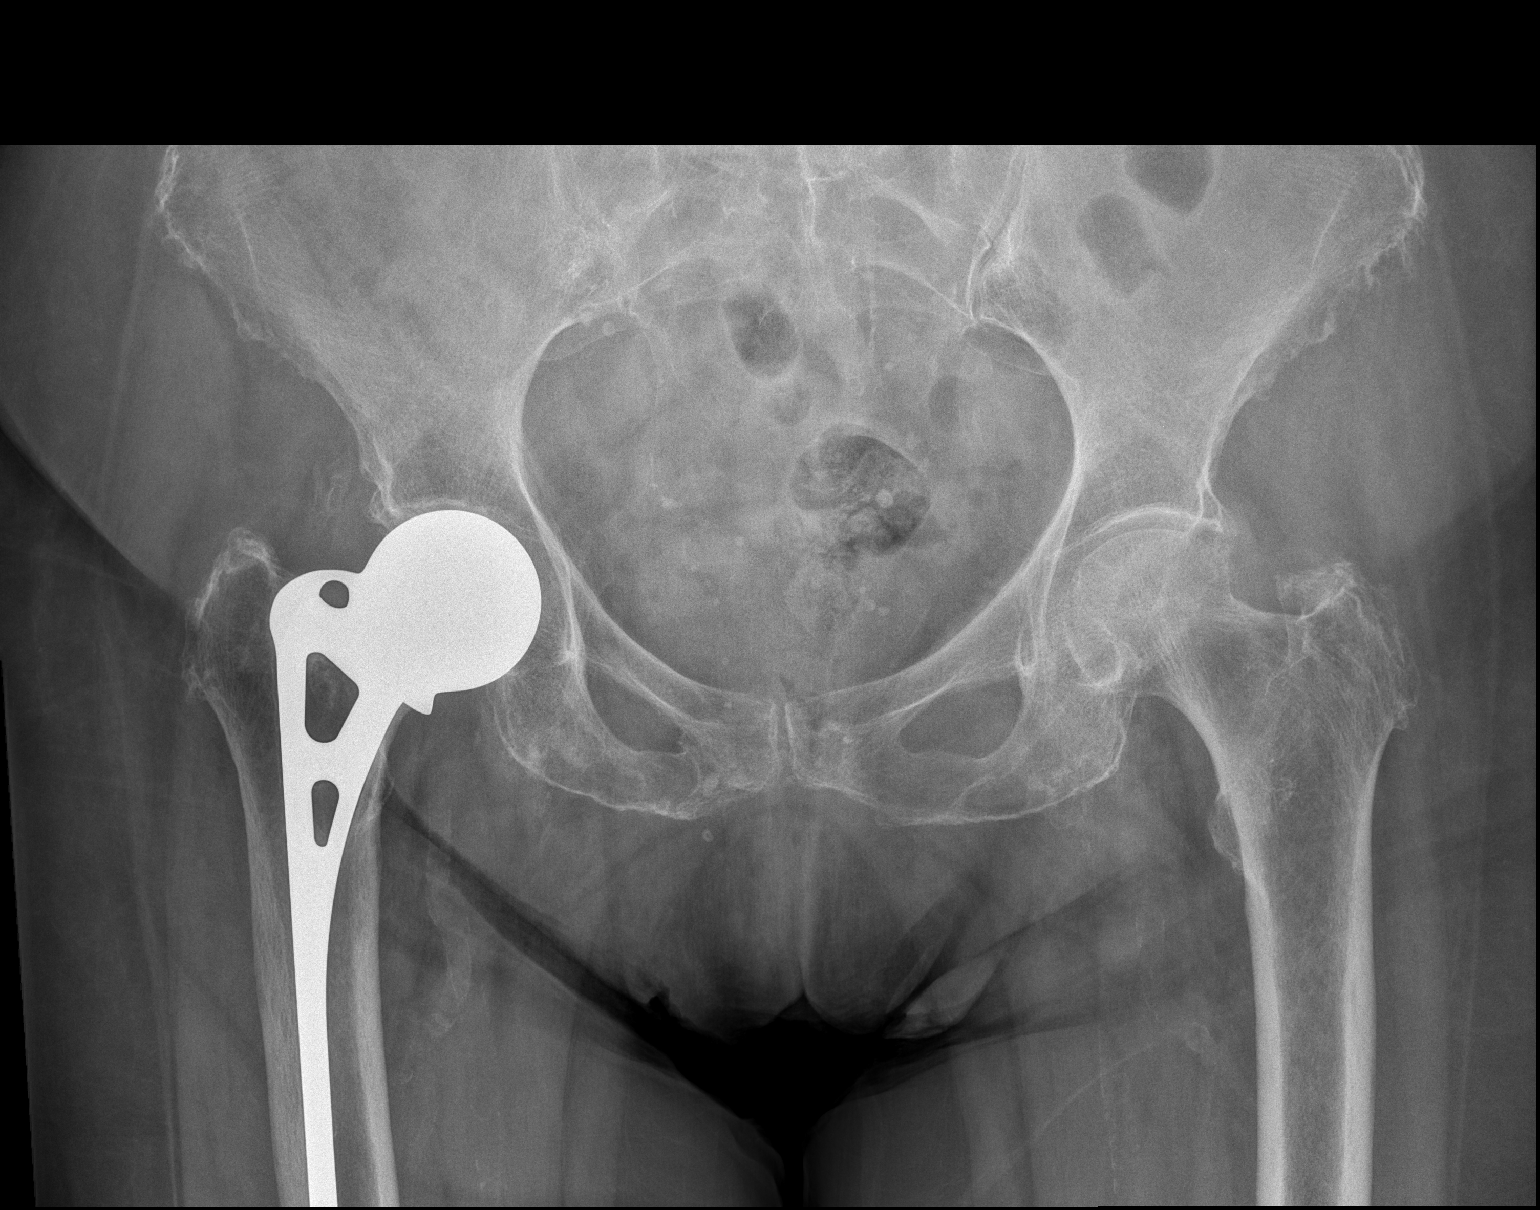
[im 2/3]
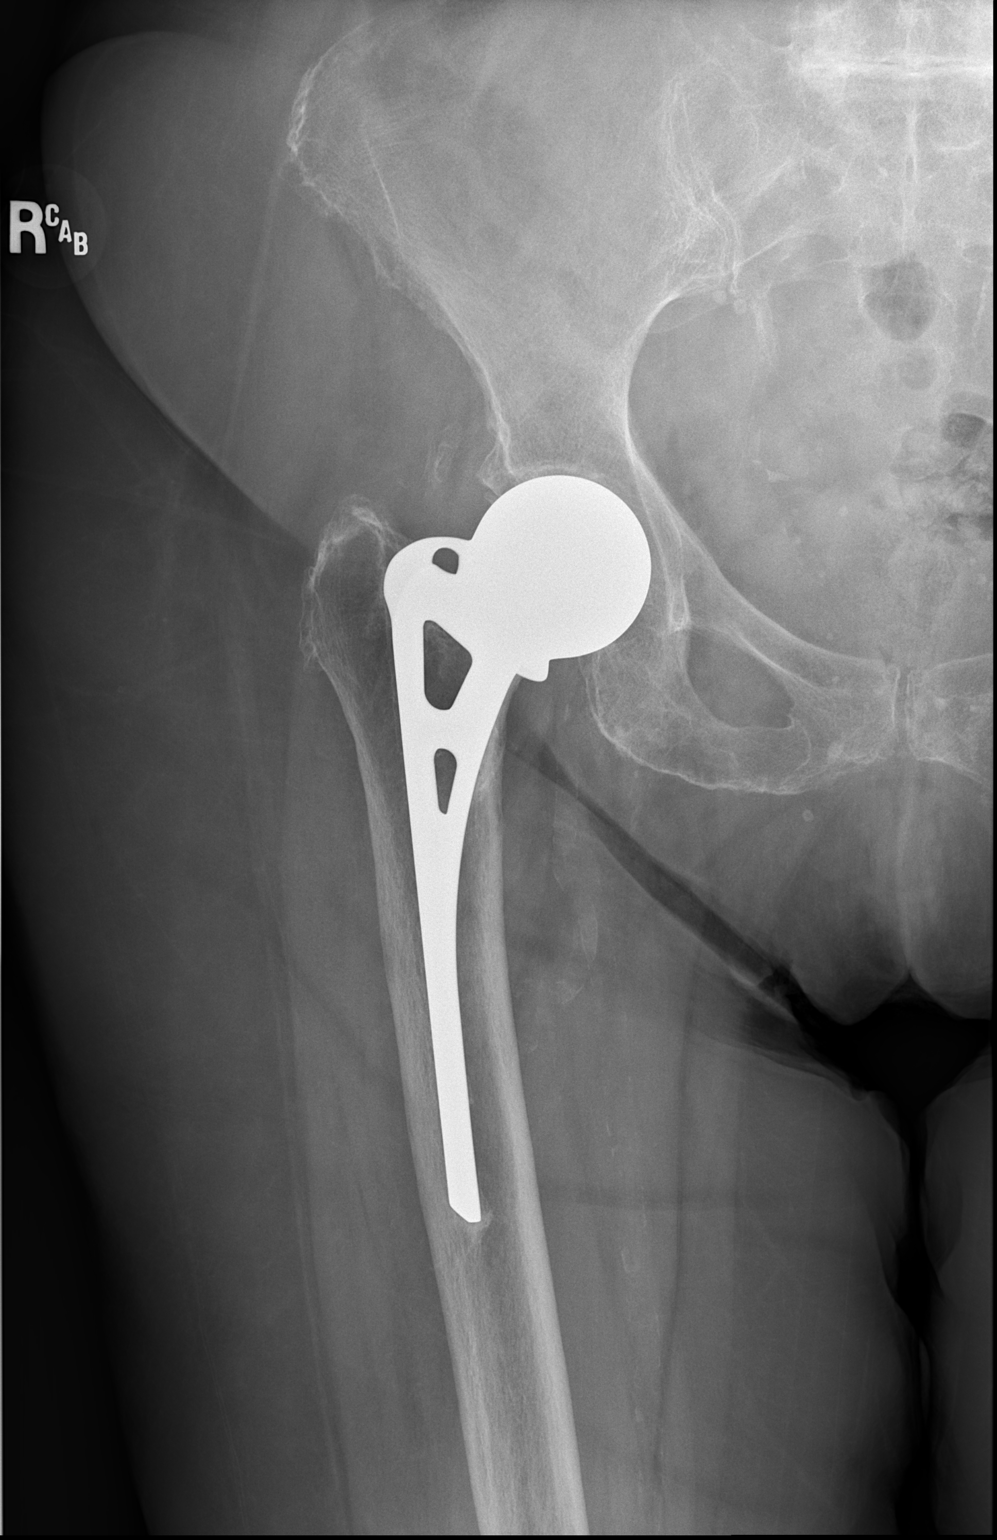
[im 3/3]
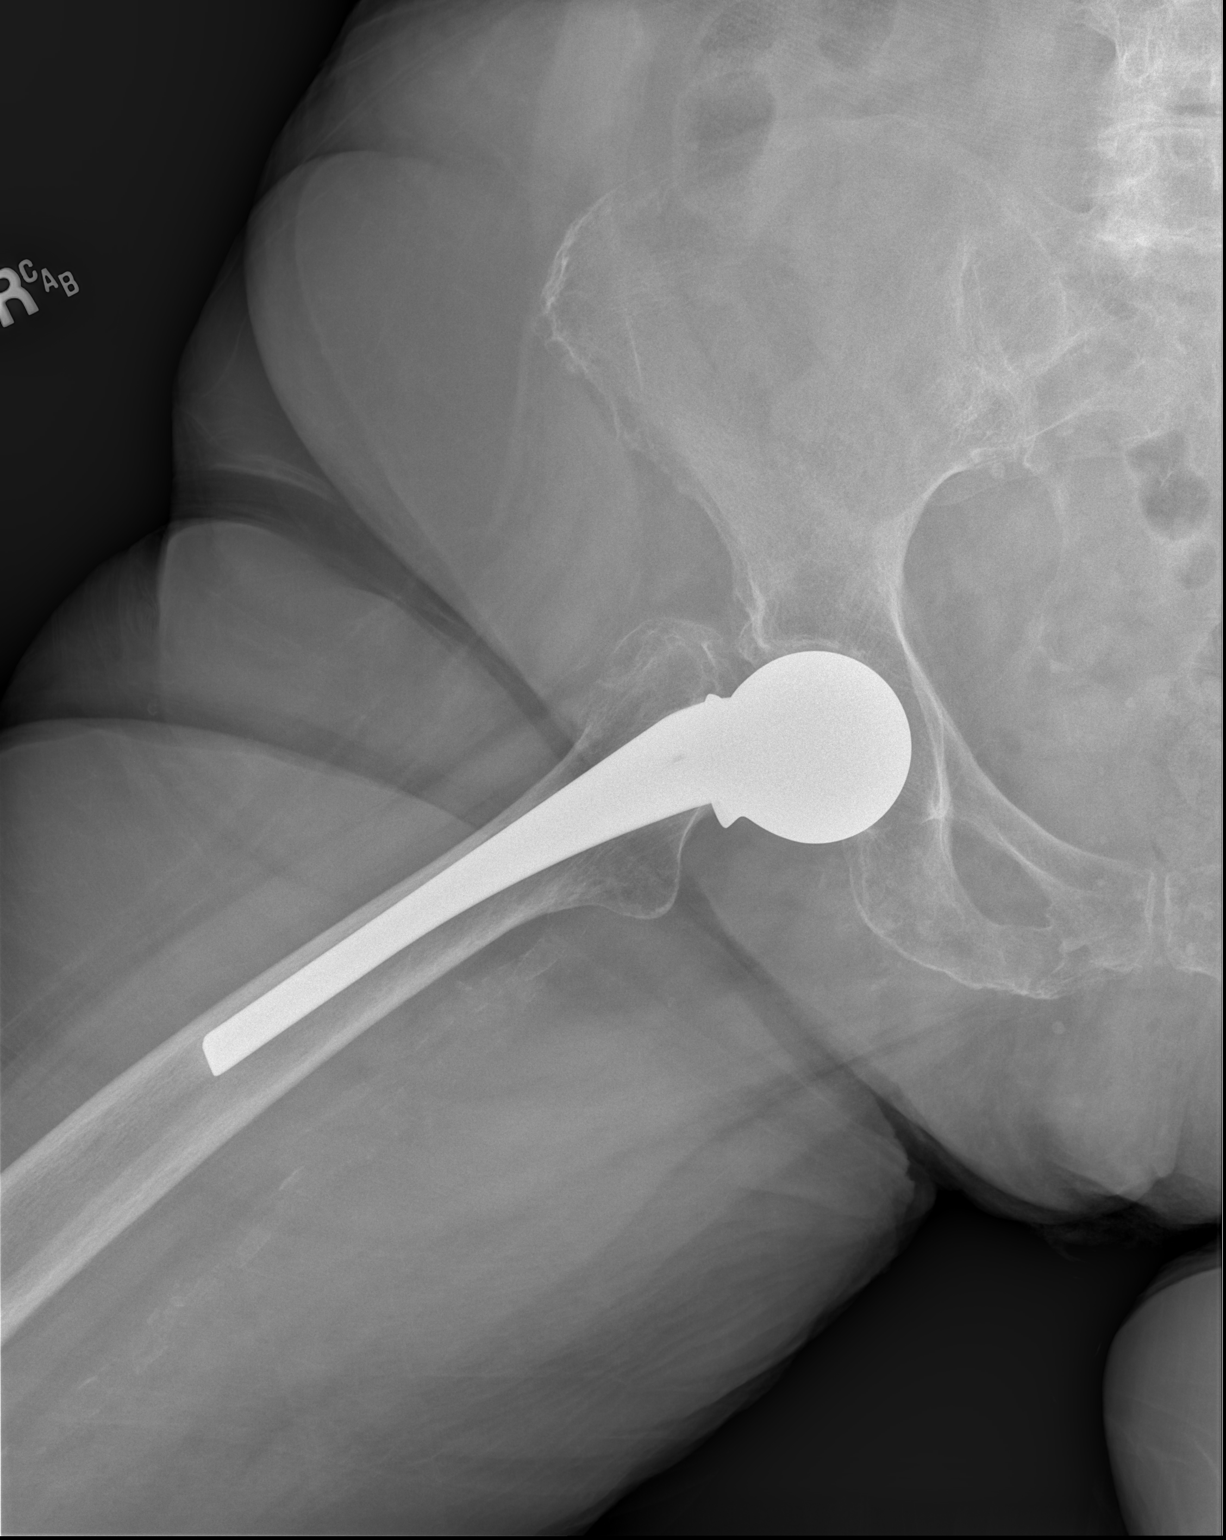

[3 of 3 positions shown; findings below may reference images not displayed]

FINDINGS: Changes of right hip replacement. Early degenerative changes in the
left hip with spurring. No acute bony abnormality. Specifically, no
fracture, subluxation, or dislocation. Soft tissues are intact.
IMPRESSION: Right hip replacement.  No acute bony abnormality.

## 2019-08-06 NOTE — Progress Notes (Signed)
COMMUNITY PALLIATIVE CARE SW NOTE  PATIENT NAME: Laurie Fuentes DOB: April 06, 1926 MRN: [id number]  PRIMARY CARE PROVIDER: Juline Patch, MD  RESPONSIBLE PARTY:  Acct ID - Guarantor Home Phone Work Phone Relationship Acct Type  [id number] [name], [name] [phone]  Self P/F     [address], [place], [place] [address]     PLAN OF CARE and INTERVENTIONS:             1. GOALS OF CARE/ ADVANCE CARE PLANNING:  Goal is for patient to improve. Patient is a DNR per daughter, [name]. Form is in the facility chart.  2. SOCIAL/EMOTIONAL/SPIRITUAL ASSESSMENT/ INTERVENTIONS:  SW and RN visited with patient at [facility]. Patient was working with Astronomer and eating her lunch. Patient was sitting in her wheelchair. Speech Therapist is unsure if patient is getting up and walking but a gait belt was beside patient for transfers. Patient was living at home with her grandson prior to this fall. Patient has been at [facility] for a little over a week. Patient is hard of hearing. Team spoke with nurse at facility. He said that patient has no pain concerns, seems to be doing well.  3. PATIENT/CAREGIVER EDUCATION/ COPING:  Patient was alert, awake. Patient was oriented to self. Patient kept asking team "to go home". Team provided reassurance and encouragement. Patient did not appear distressed or anxious.  4. PERSONAL EMERGENCY PLAN:  Facility will call 9-1-1 for emergencies. 5. COMMUNITY RESOURCES COORDINATION/ HEALTH CARE NAVIGATION:  Patient is receiving PT, OT and Speech at [facility].  6. FINANCIAL/LEGAL CONCERNS/INTERVENTIONS:  To be discussed.     SOCIAL HX:  Social History   Tobacco Use  . Smoking status: Never Smoker  . Smokeless tobacco: Never Used  Substance Use Topics  . Alcohol use: No    Alcohol/week: 0.0 standard drinks    CODE STATUS:   Code Status: Prior (DNR) ADVANCED DIRECTIVES: N MOST FORM COMPLETE:  No. HOSPICE EDUCATION PROVIDED: None.  PPS: Patient is dependent of most  ADLs. Was trying to feed herself during visit with assistance.  I spent 60 minutes with patient/family, from 12:30-1:30p providing education, support and consultation.   Margaretmary Lombard, LCSW

## 2019-08-06 NOTE — Progress Notes (Signed)
PATIENT NAME: Laurie Fuentes DOB: 08-Dec-1925 MRN: [id number]  PRIMARY CARE PROVIDER: Juline Patch, MD  RESPONSIBLE PARTY:  Acct ID - Guarantor Home Phone Work Phone Relationship Acct Type  [id number] JAMERA, [phone]  Self P/F     [address], [place], [place] [address]    PLAN OF CARE and INTERVENTIONS:               1.  GOALS OF CARE/ ADVANCE CARE PLANNING:  Reamin at facility for rehab.                2.  PATIENT/CAREGIVER EDUCATION:  Education on fall precautions, education on s/s of infection, education on s/s of pain.               4. PERSONAL EMERGENCY PLAN:  Patient is at facility.                5.  DISEASE STATUS: SW and RN made visit to facility for palliative visit. Social worker contacted patients daughter [name] and received verbal consent for patient to receive palliative care services. Patient sitting up in wheelchair in her room. Speech therapist Gaspar Bidding assisting patient with lunch and assessing patients swallowing. Speech therapist reports patients appetite is good and patient is on a soft mechanical diet with ground meats. Speech therapist reports patient has no signs or symptoms of aspiration. Nurse spoke with patients nurse Vicente Serene at facility. Vicente Serene reports therapy has been working with patient and patient has not received any Tylenol for pain today. Vicente Serene reports patient does have some discomfort with receiving therapy. Education to nurse to medicate patient prior to receiving therapy. Patients currently receiving Tylenol as needed for pain. Patient finished RX for omnicef for UTI. Patient has been at facility over a week. Patient's past medical history includes frequent falls, history of CVA, right clavicle fracture, vascular dementia, UTIs, hypertension and hypokalemia. Patient keeps stating she wants to go home. Patients grandson [name] lives with patient. Patient allows nurse to perform assessment. Patient breath sounds are clear and patient has no cough or  shortness of breath. Patient has trace edema in lower extremities. Facility reports patient has been resting well at night. Patient has no open areas of skin breakdown. Daughter informed social worker she is unsure if they will be able to bring patient home due to patient requiring a higher level of care. Patient has a DNR on chart at facility. Family and facility in agreement with palliative care services for patient. Facility staff encouraged to contact palliative care team with questions or concerns.    HISTORY OF PRESENT ILLNESS: Patient is a 85 year old patient who suffered a fall and admitted to facility for rehab.     CODE STATUS: DNR  ADVANCED DIRECTIVES: N MOST FORM: No PPS: 30%   PHYSICAL EXAM:   VITALS: See vital signs  LUNGS: clear to auscultation , decreased breath sounds CARDIAC: Cor RRR  EXTREMITIES: Trace edema SKIN: Skin color, texture, turgor normal. No rashes or lesions  NEURO: positive for gait problems, memory problems and weakness       [name], RN

## 2019-08-19 DIAGNOSIS — Z03818 Encounter for observation for suspected exposure to other biological agents ruled out: Secondary | ICD-10-CM | POA: Diagnosis not present

## 2019-08-19 DIAGNOSIS — Z23 Encounter for immunization: Secondary | ICD-10-CM | POA: Diagnosis not present

## 2019-08-19 DIAGNOSIS — M6281 Muscle weakness (generalized): Secondary | ICD-10-CM | POA: Diagnosis not present

## 2019-08-20 DIAGNOSIS — S42021A Displaced fracture of shaft of right clavicle, initial encounter for closed fracture: Secondary | ICD-10-CM | POA: Diagnosis not present

## 2019-08-21 DIAGNOSIS — Z23 Encounter for immunization: Secondary | ICD-10-CM | POA: Diagnosis not present

## 2019-08-21 DIAGNOSIS — M6281 Muscle weakness (generalized): Secondary | ICD-10-CM | POA: Diagnosis not present

## 2019-08-24 DIAGNOSIS — I10 Essential (primary) hypertension: Secondary | ICD-10-CM | POA: Diagnosis not present

## 2019-08-24 DIAGNOSIS — Z23 Encounter for immunization: Secondary | ICD-10-CM | POA: Diagnosis not present

## 2019-08-24 DIAGNOSIS — I82409 Acute embolism and thrombosis of unspecified deep veins of unspecified lower extremity: Secondary | ICD-10-CM | POA: Diagnosis not present

## 2019-08-24 DIAGNOSIS — E440 Moderate protein-calorie malnutrition: Secondary | ICD-10-CM | POA: Diagnosis not present

## 2019-08-24 DIAGNOSIS — S42001A Fracture of unspecified part of right clavicle, initial encounter for closed fracture: Secondary | ICD-10-CM | POA: Diagnosis not present

## 2019-08-24 DIAGNOSIS — M6281 Muscle weakness (generalized): Secondary | ICD-10-CM | POA: Diagnosis not present

## 2019-08-24 DIAGNOSIS — K219 Gastro-esophageal reflux disease without esophagitis: Secondary | ICD-10-CM | POA: Diagnosis not present

## 2019-08-24 DIAGNOSIS — I2510 Atherosclerotic heart disease of native coronary artery without angina pectoris: Secondary | ICD-10-CM | POA: Diagnosis not present

## 2019-08-24 DIAGNOSIS — E876 Hypokalemia: Secondary | ICD-10-CM | POA: Diagnosis not present

## 2019-08-24 DIAGNOSIS — I6389 Other cerebral infarction: Secondary | ICD-10-CM | POA: Diagnosis not present

## 2019-08-24 DIAGNOSIS — I480 Paroxysmal atrial fibrillation: Secondary | ICD-10-CM | POA: Diagnosis not present

## 2019-08-24 DIAGNOSIS — S065X9A Traumatic subdural hemorrhage with loss of consciousness of unspecified duration, initial encounter: Secondary | ICD-10-CM | POA: Diagnosis not present

## 2019-08-24 DIAGNOSIS — F0150 Vascular dementia without behavioral disturbance: Secondary | ICD-10-CM | POA: Diagnosis not present

## 2019-08-25 DIAGNOSIS — Z03818 Encounter for observation for suspected exposure to other biological agents ruled out: Secondary | ICD-10-CM | POA: Diagnosis not present

## 2019-08-25 DIAGNOSIS — M6281 Muscle weakness (generalized): Secondary | ICD-10-CM | POA: Diagnosis not present

## 2019-08-25 DIAGNOSIS — Z23 Encounter for immunization: Secondary | ICD-10-CM | POA: Diagnosis not present

## 2019-08-26 DIAGNOSIS — M6281 Muscle weakness (generalized): Secondary | ICD-10-CM | POA: Diagnosis not present

## 2019-08-26 DIAGNOSIS — Z23 Encounter for immunization: Secondary | ICD-10-CM | POA: Diagnosis not present

## 2019-08-27 DIAGNOSIS — Z23 Encounter for immunization: Secondary | ICD-10-CM | POA: Diagnosis not present

## 2019-08-27 DIAGNOSIS — M6281 Muscle weakness (generalized): Secondary | ICD-10-CM | POA: Diagnosis not present

## 2019-08-30 DIAGNOSIS — M6281 Muscle weakness (generalized): Secondary | ICD-10-CM | POA: Diagnosis not present

## 2019-08-30 DIAGNOSIS — Z23 Encounter for immunization: Secondary | ICD-10-CM | POA: Diagnosis not present

## 2019-08-31 DIAGNOSIS — Z23 Encounter for immunization: Secondary | ICD-10-CM | POA: Diagnosis not present

## 2019-08-31 DIAGNOSIS — M6281 Muscle weakness (generalized): Secondary | ICD-10-CM | POA: Diagnosis not present

## 2019-09-01 DIAGNOSIS — Z03818 Encounter for observation for suspected exposure to other biological agents ruled out: Secondary | ICD-10-CM | POA: Diagnosis not present

## 2019-09-01 DIAGNOSIS — M6281 Muscle weakness (generalized): Secondary | ICD-10-CM | POA: Diagnosis not present

## 2019-09-01 DIAGNOSIS — Z23 Encounter for immunization: Secondary | ICD-10-CM | POA: Diagnosis not present

## 2019-09-02 DIAGNOSIS — Z23 Encounter for immunization: Secondary | ICD-10-CM | POA: Diagnosis not present

## 2019-09-02 DIAGNOSIS — M6281 Muscle weakness (generalized): Secondary | ICD-10-CM | POA: Diagnosis not present

## 2019-09-03 DIAGNOSIS — Z23 Encounter for immunization: Secondary | ICD-10-CM | POA: Diagnosis not present

## 2019-09-03 DIAGNOSIS — M6281 Muscle weakness (generalized): Secondary | ICD-10-CM | POA: Diagnosis not present

## 2019-09-06 DIAGNOSIS — M6281 Muscle weakness (generalized): Secondary | ICD-10-CM | POA: Diagnosis not present

## 2019-09-07 DIAGNOSIS — M6281 Muscle weakness (generalized): Secondary | ICD-10-CM | POA: Diagnosis not present

## 2019-09-08 DIAGNOSIS — M6281 Muscle weakness (generalized): Secondary | ICD-10-CM | POA: Diagnosis not present

## 2019-09-08 DIAGNOSIS — Z03818 Encounter for observation for suspected exposure to other biological agents ruled out: Secondary | ICD-10-CM | POA: Diagnosis not present

## 2019-09-09 DIAGNOSIS — M6281 Muscle weakness (generalized): Secondary | ICD-10-CM | POA: Diagnosis not present

## 2019-09-10 ENCOUNTER — Telehealth: Payer: Self-pay

## 2019-09-10 DIAGNOSIS — M6281 Muscle weakness (generalized): Secondary | ICD-10-CM | POA: Diagnosis not present

## 2019-09-10 NOTE — Telephone Encounter (Signed)
SW spoke with Hilda Blades at [facility] to schedule telehealth visit. Hilda Blades has scheduled visit for RN/SW team with Ivin Booty on 09-13-19 at 10:30AM.

## 2019-09-13 ENCOUNTER — Non-Acute Institutional Stay: Payer: Medicare Other

## 2019-09-13 ENCOUNTER — Other Ambulatory Visit: Payer: Self-pay

## 2019-09-13 ENCOUNTER — Other Ambulatory Visit: Payer: Medicare Other

## 2019-09-13 DIAGNOSIS — M6281 Muscle weakness (generalized): Secondary | ICD-10-CM | POA: Diagnosis not present

## 2019-09-13 DIAGNOSIS — Z515 Encounter for palliative care: Secondary | ICD-10-CM

## 2019-09-13 NOTE — Progress Notes (Signed)
PATIENT NAME: Laurie Fuentes DOB: [birth year] MRN: [id number]  PRIMARY CARE PROVIDER: Juline Patch, MD  RESPONSIBLE PARTY:  Acct ID - Guarantor Home Phone Work Phone Relationship Acct Type  [id number] MICKIE, [phone]  Self P/F     [address], [place], [place] [address]    PLAN OF CARE and INTERVENTIONS:               1.  GOALS OF CARE/ ADVANCE CARE PLANNING:  Remain at long term care facility. Continue to receive PT.                 2.  PATIENT/CAREGIVER EDUCATION: Education to facility staff on palliative care, reviewed meds, support.                 3. DISEASE STATUS: Due to the COVID-19 crisis, this visit was done via telephonic from my office and it was initiated and consent by this patient and or family.  SW and RN made telephonic visit to staff at [facility] facility. Nurse and social worker spoke with [place] and Higher education careers adviser at facility. Patient had recent MD appointment and x-ray was completed. Patients right clavicle fracture is healing well and MD informed patient and staff that patient could remove her immobilizer. Staff nurse reports patient is not having  pain. Patient able to tolerate PT. Patient will be residing as a long-term resident at facility. Patients current weight is 115 lb. Patient remains non-ambulatory but is assisted up from bed to wheelchair daily. Facility staff reports  patient asks for assistance when she needs help. Facility nurse reports facility staff is continuing to test residents  weekly for covid. Patient still requires assist with meals. Patient remains confused and is very hard of hearing. Patient has not had any shortness of breath or cough per facility staff. Patient has not suffered any recent falls.   Patient has been sleeping well at night. Facility staff reports all in all patient is doing well. Facility staff encouraged to contact palliative care with questions or concerns.     HISTORY OF PRESENT ILLNESS:  Patient is  a 85 year old female who resides at long term care facility.  Patient is seen monthly ad prn by [facility] team.  CODE STATUS: DNR  ADVANCED DIRECTIVES: N MOST FORM: No PPS: 30%   PHYSICAL EXAM:   VITALS: See vital signs/ VS reported by facility staff nurse Ivin Booty.  LUNGS: No cough or shortness of breath per staff nurse CARDIAC: Cor RRR  EXTREMITIES: Trace edema SKIN: Skin color, texture, turgor normal. No rashes or lesions  NEURO: positive for gait problems, memory problems and weakness       Laurie Simmer, RN

## 2019-09-13 NOTE — Progress Notes (Signed)
COMMUNITY PALLIATIVE CARE SW NOTE  PATIENT NAME: Laurie Fuentes DOB: 1926/10/25 MRN: [id number]  PRIMARY CARE PROVIDER: Juline Patch, MD  RESPONSIBLE PARTY:  Acct ID - Guarantor Home Phone Work Phone Relationship Acct Type  [id number] GEETA, [phone]  Self P/F     [address], [place],  [address]     PLAN OF CARE and INTERVENTIONS:             1. GOALS OF CARE/ ADVANCE CARE PLANNING:  Goal is for patient to improve. Patient is a DNR. Form is in the facility chart.  2. SOCIAL/EMOTIONAL/SPIRITUAL ASSESSMENT/ INTERVENTIONS:  SW and RN completed TELEHEALTH visit with Ivin Booty, Therapist, sports and [place], SW at [facility]. Patient continues to work with therapy. No reported falls. Patient is eating well with assistance. RN discussed patient's vital signs. No other concerns from staff at this time.  3. PATIENT/CAREGIVER EDUCATION/ COPING:  Facility staff talk with family each week about COVID testing results. Facility staff deny concerns with patient's coping.  4. PERSONAL EMERGENCY PLAN:  Facility will call 9-1-1 for emergencies. 5. COMMUNITY RESOURCES COORDINATION/ HEALTH CARE NAVIGATION:  Patient went to see ortho on 10/20. Patient continues receiving PT, OT and Speech at [facility]. The plan to for patient to transition to LTC following rehab.  6. FINANCIAL/LEGAL CONCERNS/INTERVENTIONS:  None.     SOCIAL HX:  Social History   Tobacco Use  . Smoking status: Never Smoker  . Smokeless tobacco: Never Used  Substance Use Topics  . Alcohol use: No    Alcohol/week: 0.0 standard drinks    CODE STATUS:   Code Status: Prior (DNR) ADVANCED DIRECTIVES: N MOST FORM COMPLETE:  No. HOSPICE EDUCATION PROVIDED: None.  PPS: Patient is dependent of most ADLs.   I spent33minutes with patient/family, from 10:45-11:15a providing education, support and consultation.  Margaretmary Lombard, LCSW

## 2019-09-14 DIAGNOSIS — M6281 Muscle weakness (generalized): Secondary | ICD-10-CM | POA: Diagnosis not present

## 2019-09-15 DIAGNOSIS — Z03818 Encounter for observation for suspected exposure to other biological agents ruled out: Secondary | ICD-10-CM | POA: Diagnosis not present

## 2019-09-15 DIAGNOSIS — M6281 Muscle weakness (generalized): Secondary | ICD-10-CM | POA: Diagnosis not present

## 2019-09-16 DIAGNOSIS — M6281 Muscle weakness (generalized): Secondary | ICD-10-CM | POA: Diagnosis not present

## 2019-09-17 DIAGNOSIS — M6281 Muscle weakness (generalized): Secondary | ICD-10-CM | POA: Diagnosis not present

## 2019-09-20 DIAGNOSIS — M6281 Muscle weakness (generalized): Secondary | ICD-10-CM | POA: Diagnosis not present

## 2019-09-21 DIAGNOSIS — M6281 Muscle weakness (generalized): Secondary | ICD-10-CM | POA: Diagnosis not present

## 2019-09-21 DIAGNOSIS — I82409 Acute embolism and thrombosis of unspecified deep veins of unspecified lower extremity: Secondary | ICD-10-CM | POA: Diagnosis not present

## 2019-09-21 DIAGNOSIS — E440 Moderate protein-calorie malnutrition: Secondary | ICD-10-CM | POA: Diagnosis not present

## 2019-09-21 DIAGNOSIS — I2510 Atherosclerotic heart disease of native coronary artery without angina pectoris: Secondary | ICD-10-CM | POA: Diagnosis not present

## 2019-09-21 DIAGNOSIS — E876 Hypokalemia: Secondary | ICD-10-CM | POA: Diagnosis not present

## 2019-09-21 DIAGNOSIS — I6389 Other cerebral infarction: Secondary | ICD-10-CM | POA: Diagnosis not present

## 2019-09-21 DIAGNOSIS — S42001A Fracture of unspecified part of right clavicle, initial encounter for closed fracture: Secondary | ICD-10-CM | POA: Diagnosis not present

## 2019-09-21 DIAGNOSIS — F0150 Vascular dementia without behavioral disturbance: Secondary | ICD-10-CM | POA: Diagnosis not present

## 2019-09-21 DIAGNOSIS — S065X9A Traumatic subdural hemorrhage with loss of consciousness of unspecified duration, initial encounter: Secondary | ICD-10-CM | POA: Diagnosis not present

## 2019-09-21 DIAGNOSIS — K219 Gastro-esophageal reflux disease without esophagitis: Secondary | ICD-10-CM | POA: Diagnosis not present

## 2019-09-22 DIAGNOSIS — M6281 Muscle weakness (generalized): Secondary | ICD-10-CM | POA: Diagnosis not present

## 2019-09-22 DIAGNOSIS — Z03818 Encounter for observation for suspected exposure to other biological agents ruled out: Secondary | ICD-10-CM | POA: Diagnosis not present

## 2019-09-23 DIAGNOSIS — M6281 Muscle weakness (generalized): Secondary | ICD-10-CM | POA: Diagnosis not present

## 2019-09-24 DIAGNOSIS — M6281 Muscle weakness (generalized): Secondary | ICD-10-CM | POA: Diagnosis not present

## 2019-09-27 DIAGNOSIS — M6281 Muscle weakness (generalized): Secondary | ICD-10-CM | POA: Diagnosis not present

## 2019-09-28 DIAGNOSIS — M6281 Muscle weakness (generalized): Secondary | ICD-10-CM | POA: Diagnosis not present

## 2019-09-29 ENCOUNTER — Telehealth: Payer: Self-pay

## 2019-09-29 DIAGNOSIS — Z03818 Encounter for observation for suspected exposure to other biological agents ruled out: Secondary | ICD-10-CM | POA: Diagnosis not present

## 2019-09-29 DIAGNOSIS — M6281 Muscle weakness (generalized): Secondary | ICD-10-CM | POA: Diagnosis not present

## 2019-09-29 NOTE — Telephone Encounter (Signed)
Telephone call to facility to schedule palliative care visit with patient. Spoke with Hilda Blades and she is in agreement with telephonic on 10-13-19 at 1:45 PM.

## 2019-09-30 DIAGNOSIS — M6281 Muscle weakness (generalized): Secondary | ICD-10-CM | POA: Diagnosis not present

## 2019-10-01 DIAGNOSIS — M6281 Muscle weakness (generalized): Secondary | ICD-10-CM | POA: Diagnosis not present

## 2019-10-04 DIAGNOSIS — M6281 Muscle weakness (generalized): Secondary | ICD-10-CM | POA: Diagnosis not present

## 2019-10-05 DIAGNOSIS — D485 Neoplasm of uncertain behavior of skin: Secondary | ICD-10-CM | POA: Diagnosis not present

## 2019-10-05 DIAGNOSIS — C44622 Squamous cell carcinoma of skin of right upper limb, including shoulder: Secondary | ICD-10-CM | POA: Diagnosis not present

## 2019-10-05 DIAGNOSIS — Z03818 Encounter for observation for suspected exposure to other biological agents ruled out: Secondary | ICD-10-CM | POA: Diagnosis not present

## 2019-10-06 DIAGNOSIS — S42001A Fracture of unspecified part of right clavicle, initial encounter for closed fracture: Secondary | ICD-10-CM | POA: Diagnosis not present

## 2019-10-06 DIAGNOSIS — I10 Essential (primary) hypertension: Secondary | ICD-10-CM | POA: Diagnosis not present

## 2019-10-06 DIAGNOSIS — K5901 Slow transit constipation: Secondary | ICD-10-CM | POA: Diagnosis not present

## 2019-10-12 DIAGNOSIS — I739 Peripheral vascular disease, unspecified: Secondary | ICD-10-CM | POA: Diagnosis not present

## 2019-10-12 DIAGNOSIS — R600 Localized edema: Secondary | ICD-10-CM | POA: Diagnosis not present

## 2019-10-12 DIAGNOSIS — B351 Tinea unguium: Secondary | ICD-10-CM | POA: Diagnosis not present

## 2019-10-13 ENCOUNTER — Non-Acute Institutional Stay: Payer: Medicare Other

## 2019-10-13 ENCOUNTER — Other Ambulatory Visit: Payer: Self-pay

## 2019-10-13 ENCOUNTER — Other Ambulatory Visit: Payer: Medicare Other

## 2019-10-13 DIAGNOSIS — Z515 Encounter for palliative care: Secondary | ICD-10-CM

## 2019-10-13 DIAGNOSIS — F0150 Vascular dementia without behavioral disturbance: Secondary | ICD-10-CM | POA: Diagnosis not present

## 2019-10-13 DIAGNOSIS — I480 Paroxysmal atrial fibrillation: Secondary | ICD-10-CM | POA: Diagnosis not present

## 2019-10-13 DIAGNOSIS — C44622 Squamous cell carcinoma of skin of right upper limb, including shoulder: Secondary | ICD-10-CM | POA: Diagnosis not present

## 2019-10-13 DIAGNOSIS — S42001A Fracture of unspecified part of right clavicle, initial encounter for closed fracture: Secondary | ICD-10-CM | POA: Diagnosis not present

## 2019-10-13 DIAGNOSIS — Z03818 Encounter for observation for suspected exposure to other biological agents ruled out: Secondary | ICD-10-CM | POA: Diagnosis not present

## 2019-10-13 NOTE — Progress Notes (Signed)
COMMUNITY PALLIATIVE CARE SW NOTE  PATIENT NAME: Laurie Fuentes DOB: 1926-11-03 MRN: [id number]  PRIMARY CARE PROVIDER: Juline Patch, MD  RESPONSIBLE PARTY:  Acct ID - Guarantor Home Phone Work Phone Relationship Acct Type  [id number] TALENA, [phone]  Self P/F     [address], [place], [place] [address]     PLAN OF CARE and INTERVENTIONS:             1. GOALS OF CARE/ ADVANCE CARE PLANNING:  Goal is for patient to continue to improve. Patient is a DNR. Form is in the chart at the facility. 2. SOCIAL/EMOTIONAL/SPIRITUAL ASSESSMENT/ INTERVENTIONS:  SW and RN completed TELEHEALTH visit with Jamelle Rushing, Therapist, sports at [facility]. Patient is doing "well" per [place]. No issues with care. Patient is continuing to work with therapy. No reported falls. Patient is eating well and sleeping good. RN discussed patient's vital signs with staff, no concerns. 3. PATIENT/CAREGIVER EDUCATION/ COPING:  Denies concerns. SW attempted to contact patient's daughter to discuss and provide support. No answer and unable to leave VM. 4. PERSONAL EMERGENCY PLAN:  Facility will call 9-1-1 for emergencies. 5. COMMUNITY RESOURCES COORDINATION/ HEALTH CARE NAVIGATION:  None. 6. FINANCIAL/LEGAL CONCERNS/INTERVENTIONS:  None.     SOCIAL HX:  Social History   Tobacco Use  . Smoking status: Never Smoker  . Smokeless tobacco: Never Used  Substance Use Topics  . Alcohol use: No    Alcohol/week: 0.0 standard drinks    CODE STATUS:   Code Status: Prior (DNR) ADVANCED DIRECTIVES: N MOST FORM COMPLETE:  No. HOSPICE EDUCATION PROVIDED: None.  PPS: Patient is dependent of most ADLs, is able to feed herself.   Due to patient's stability, patient will no longer be followed by [facility] team at this time. Patient will remain with [place] team and be followed by NP.   I spent64minutes with patient/family, from1:30-2:00pproviding education, support and consultation.  Margaretmary Lombard, LCSW

## 2019-10-13 NOTE — Progress Notes (Signed)
PATIENT NAME: Laurie Fuentes DOB: 26-Sep-1926 MRN: [id number]  PRIMARY CARE PROVIDER: Juline Patch, MD  RESPONSIBLE PARTY:  Acct ID - Guarantor Home Phone Work Phone Relationship Acct Type  [id number] Laurie Fuentes, Laurie [phone]  Self P/F     [address], [place], [place] [address]    PLAN OF CARE and INTERVENTIONS:               1.  GOALS OF CARE/ ADVANCE CARE PLANNING:  Remain at facility and be comfortable.                2.  PATIENT/CAREGIVER EDUCATION:  Education on s/s of infection, education on fall precautions, reviewed meds                              3.  DISEASE STATUS: Due to the COVID-19 crisis, this visit was done via telephonic from my office and it was initiated and consent by this patient and or family. SW and RN completed scheduled telephonic visit.   Palliative team spoke with Lurline Del LPN at facility. Jamelle Rushing reports patient is doing good and vital signs have been normal. Patient did have biopsy done on lesion on right hand and area is being dressed by  facility staff nurse. Patient has no open areas of skin breakdown anywhere else per [facility].  Patient is got up from bed to her wheelchair everyday. Patient is feeding self independently which is an improvement as patient needed assist with feeding sef last month.  Patient has not suffered any falls in the last month. Patients current weight is 120.2 pounds. Patient is not on oxygen. Patient has been sleeping fine per staff nurse. Patient has no edema in her lower extremities. Patient has no cough or shortness of breath per staff nurse. Patient will be transferred to NP for future visits in the future as patient appears stable and has shown no decline. Palliative team will call and update patients family that patient will be followed by NP and should patient have a change in status could receive monthly visits again. Facility staff nurse Jamelle Rushing encouraged to contact palliative care team with questions or concerns.      HISTORY OF PRESENT ILLNESS:    CODE STATUS: DNR  ADVANCED DIRECTIVES: N MOST FORM: N PPS: 30%   PHYSICAL EXAM:   VITALS: Today's Vitals   10/13/19 1408  BP: (!) 154/63  Pulse: 69  Resp: 18  Temp: (!) 97.3 F (36.3 C)  TempSrc: Temporal  SpO2: 97%  Weight: 120 lb 3.2 oz (54.5 kg)  PainSc: 0-No pain    LUNGS: No cough or shortness of breath per LPN Loretha CARDIAC: Heart rate 69 EXTREMITIES: none edema SKIN: Wound on right hand from biopsy of lesion on right hand  NEURO: Alert to self       [name], RN

## 2019-10-14 ENCOUNTER — Telehealth: Payer: Self-pay

## 2019-10-14 DIAGNOSIS — I6389 Other cerebral infarction: Secondary | ICD-10-CM | POA: Diagnosis not present

## 2019-10-14 DIAGNOSIS — C44622 Squamous cell carcinoma of skin of right upper limb, including shoulder: Secondary | ICD-10-CM | POA: Diagnosis not present

## 2019-10-14 DIAGNOSIS — I2510 Atherosclerotic heart disease of native coronary artery without angina pectoris: Secondary | ICD-10-CM | POA: Diagnosis not present

## 2019-10-14 DIAGNOSIS — S065X9A Traumatic subdural hemorrhage with loss of consciousness of unspecified duration, initial encounter: Secondary | ICD-10-CM | POA: Diagnosis not present

## 2019-10-14 DIAGNOSIS — I10 Essential (primary) hypertension: Secondary | ICD-10-CM | POA: Diagnosis not present

## 2019-10-14 DIAGNOSIS — K219 Gastro-esophageal reflux disease without esophagitis: Secondary | ICD-10-CM | POA: Diagnosis not present

## 2019-10-14 DIAGNOSIS — K5901 Slow transit constipation: Secondary | ICD-10-CM | POA: Diagnosis not present

## 2019-10-14 DIAGNOSIS — I82409 Acute embolism and thrombosis of unspecified deep veins of unspecified lower extremity: Secondary | ICD-10-CM | POA: Diagnosis not present

## 2019-10-14 DIAGNOSIS — E440 Moderate protein-calorie malnutrition: Secondary | ICD-10-CM | POA: Diagnosis not present

## 2019-10-14 DIAGNOSIS — F0150 Vascular dementia without behavioral disturbance: Secondary | ICD-10-CM | POA: Diagnosis not present

## 2019-10-14 DIAGNOSIS — I480 Paroxysmal atrial fibrillation: Secondary | ICD-10-CM | POA: Diagnosis not present

## 2019-10-14 DIAGNOSIS — R5381 Other malaise: Secondary | ICD-10-CM | POA: Diagnosis not present

## 2019-10-14 NOTE — Telephone Encounter (Signed)
Pt's daughter/ [name] called to ask if mom has a place on her hand, the derm wants to remove it but has told her "it may leave a gaping hole in her hand." They want an answer today per [name]. I told her we can only advise her on pt's medical conditions. We have not seen the pt since she was placed in the facility. She does have a Hx of prediabetes, dementia and increased risk for falls/ hx of falls. All of which increase healing time for a pt. I also saw the dermatologist comment about it is not causing her any pain at this time. Advised if not causing pain at this time to hold off, but ultimately it is their decision

## 2019-11-05 DIAGNOSIS — R2681 Unsteadiness on feet: Secondary | ICD-10-CM | POA: Diagnosis not present

## 2019-11-05 DIAGNOSIS — M6281 Muscle weakness (generalized): Secondary | ICD-10-CM | POA: Diagnosis not present

## 2019-11-08 DIAGNOSIS — R2681 Unsteadiness on feet: Secondary | ICD-10-CM | POA: Diagnosis not present

## 2019-11-08 DIAGNOSIS — M6281 Muscle weakness (generalized): Secondary | ICD-10-CM | POA: Diagnosis not present

## 2019-11-09 DIAGNOSIS — Z23 Encounter for immunization: Secondary | ICD-10-CM | POA: Diagnosis not present

## 2019-11-09 DIAGNOSIS — M6281 Muscle weakness (generalized): Secondary | ICD-10-CM | POA: Diagnosis not present

## 2019-11-09 DIAGNOSIS — R2681 Unsteadiness on feet: Secondary | ICD-10-CM | POA: Diagnosis not present

## 2019-11-10 DIAGNOSIS — M6281 Muscle weakness (generalized): Secondary | ICD-10-CM | POA: Diagnosis not present

## 2019-11-10 DIAGNOSIS — R2681 Unsteadiness on feet: Secondary | ICD-10-CM | POA: Diagnosis not present

## 2019-11-11 DIAGNOSIS — R2681 Unsteadiness on feet: Secondary | ICD-10-CM | POA: Diagnosis not present

## 2019-11-11 DIAGNOSIS — M6281 Muscle weakness (generalized): Secondary | ICD-10-CM | POA: Diagnosis not present

## 2019-11-12 DIAGNOSIS — M6281 Muscle weakness (generalized): Secondary | ICD-10-CM | POA: Diagnosis not present

## 2019-11-12 DIAGNOSIS — R2681 Unsteadiness on feet: Secondary | ICD-10-CM | POA: Diagnosis not present

## 2019-11-14 DIAGNOSIS — R2681 Unsteadiness on feet: Secondary | ICD-10-CM | POA: Diagnosis not present

## 2019-11-14 DIAGNOSIS — M6281 Muscle weakness (generalized): Secondary | ICD-10-CM | POA: Diagnosis not present

## 2019-11-15 DIAGNOSIS — R2681 Unsteadiness on feet: Secondary | ICD-10-CM | POA: Diagnosis not present

## 2019-11-15 DIAGNOSIS — M6281 Muscle weakness (generalized): Secondary | ICD-10-CM | POA: Diagnosis not present

## 2019-11-17 DIAGNOSIS — M6281 Muscle weakness (generalized): Secondary | ICD-10-CM | POA: Diagnosis not present

## 2019-11-17 DIAGNOSIS — R2681 Unsteadiness on feet: Secondary | ICD-10-CM | POA: Diagnosis not present

## 2019-11-18 DIAGNOSIS — R2681 Unsteadiness on feet: Secondary | ICD-10-CM | POA: Diagnosis not present

## 2019-11-18 DIAGNOSIS — M6281 Muscle weakness (generalized): Secondary | ICD-10-CM | POA: Diagnosis not present

## 2019-11-18 DIAGNOSIS — I10 Essential (primary) hypertension: Secondary | ICD-10-CM | POA: Diagnosis not present

## 2019-11-19 DIAGNOSIS — M6281 Muscle weakness (generalized): Secondary | ICD-10-CM | POA: Diagnosis not present

## 2019-11-19 DIAGNOSIS — R2681 Unsteadiness on feet: Secondary | ICD-10-CM | POA: Diagnosis not present

## 2019-11-21 DIAGNOSIS — R2681 Unsteadiness on feet: Secondary | ICD-10-CM | POA: Diagnosis not present

## 2019-11-21 DIAGNOSIS — M6281 Muscle weakness (generalized): Secondary | ICD-10-CM | POA: Diagnosis not present

## 2019-11-22 DIAGNOSIS — Z03818 Encounter for observation for suspected exposure to other biological agents ruled out: Secondary | ICD-10-CM | POA: Diagnosis not present

## 2019-11-22 DIAGNOSIS — M6281 Muscle weakness (generalized): Secondary | ICD-10-CM | POA: Diagnosis not present

## 2019-11-22 DIAGNOSIS — R2681 Unsteadiness on feet: Secondary | ICD-10-CM | POA: Diagnosis not present

## 2019-11-23 DIAGNOSIS — M6281 Muscle weakness (generalized): Secondary | ICD-10-CM | POA: Diagnosis not present

## 2019-11-23 DIAGNOSIS — R2681 Unsteadiness on feet: Secondary | ICD-10-CM | POA: Diagnosis not present

## 2019-11-24 ENCOUNTER — Other Ambulatory Visit: Payer: Self-pay

## 2019-11-24 ENCOUNTER — Non-Acute Institutional Stay: Payer: Medicare Other

## 2019-11-24 ENCOUNTER — Telehealth: Payer: Self-pay

## 2019-11-24 DIAGNOSIS — M6281 Muscle weakness (generalized): Secondary | ICD-10-CM | POA: Diagnosis not present

## 2019-11-24 DIAGNOSIS — Z515 Encounter for palliative care: Secondary | ICD-10-CM

## 2019-11-24 DIAGNOSIS — R2681 Unsteadiness on feet: Secondary | ICD-10-CM | POA: Diagnosis not present

## 2019-11-24 NOTE — Telephone Encounter (Signed)
Telephone call to patient to schedule palliative care visit with patient. Patient/family in agreement with home visit on 11-24-19 at 1:30PM.

## 2019-11-24 NOTE — Progress Notes (Signed)
COMMUNITY PALLIATIVE CARE SW NOTE  PATIENT NAME: Laurie Fuentes DOB: Jul 02, 1926 MRN: [id number]  PRIMARY CARE PROVIDER: Juline Patch, MD  RESPONSIBLE PARTY:  Acct ID - Guarantor Home Phone Work Phone Relationship Acct Type  [id number] [name], [name]  Self P/F     [address], [place], [place] [address]     PLAN OF CARE and INTERVENTIONS:             1. GOALS OF CARE/ ADVANCE CARE PLANNING:  Goal is for patient to continue to improve but remain at the facility. Patient is a DNR. Form is in the chart at the facility. 2. SOCIAL/EMOTIONAL/SPIRITUAL ASSESSMENT/ INTERVENTIONS:  SW met with patient at [facility]. Patient reports pain in her left leg and right arm. SW notified Ty, RN at the facility. Ty discussed patient's care. No concerns. Patient takes scheduled tylenol. Ty reports that patient's appetite is good, drinks Ensures. Patient is resting well at night and naps occasionally during the day. Patient requires two person assist to transfer to the wheelchair, and sits up in the chair most of the day. Ty said patient is pleasant with staff, no issues with care. 3. PATIENT/CAREGIVER EDUCATION/ COPING:  Patient was alert, engaged when SW asked questions. Patient was oriented to self. Patient denies concerns and was calm during visit. SW attempted to contact daughter, but no answer and unable to leave VM. 4. PERSONAL EMERGENCY PLAN:  Facility will call 9-1-1 for emergencies. 5. COMMUNITY RESOURCES COORDINATION/ HEALTH CARE NAVIGATION:  None. 6. FINANCIAL/LEGAL CONCERNS/INTERVENTIONS:  None.      SOCIAL HX:  Social History   Tobacco Use  . Smoking status: Never Smoker  . Smokeless tobacco: Never Used  Substance Use Topics  . Alcohol use: No    Alcohol/week: 0.0 standard drinks    CODE STATUS:   Code Status: Prior (DNR) ADVANCED DIRECTIVES: N MOST FORM COMPLETE:  No. HOSPICE EDUCATION PROVIDED: None.  PPS: Patient is dependent ofmostADLs. Patient is two person  assist with transfers. Patient is able to feed herself.   Due to patient's stability, patient will no longer be followed by [facility] team at this time. Patient will remain with [place] team and be followed by NP.   I spent54mnutes with patient/family, from1:30-2:00pproviding education, support and consultation.  WMargaretmary Lombard LCSW

## 2019-11-25 DIAGNOSIS — M6281 Muscle weakness (generalized): Secondary | ICD-10-CM | POA: Diagnosis not present

## 2019-11-25 DIAGNOSIS — M25562 Pain in left knee: Secondary | ICD-10-CM | POA: Diagnosis not present

## 2019-11-25 DIAGNOSIS — M1612 Unilateral primary osteoarthritis, left hip: Secondary | ICD-10-CM | POA: Diagnosis not present

## 2019-11-25 DIAGNOSIS — R2681 Unsteadiness on feet: Secondary | ICD-10-CM | POA: Diagnosis not present

## 2019-11-25 DIAGNOSIS — M1712 Unilateral primary osteoarthritis, left knee: Secondary | ICD-10-CM | POA: Diagnosis not present

## 2019-11-26 DIAGNOSIS — M6281 Muscle weakness (generalized): Secondary | ICD-10-CM | POA: Diagnosis not present

## 2019-11-26 DIAGNOSIS — R2681 Unsteadiness on feet: Secondary | ICD-10-CM | POA: Diagnosis not present

## 2019-11-28 DIAGNOSIS — R2681 Unsteadiness on feet: Secondary | ICD-10-CM | POA: Diagnosis not present

## 2019-11-28 DIAGNOSIS — M6281 Muscle weakness (generalized): Secondary | ICD-10-CM | POA: Diagnosis not present

## 2019-11-29 DIAGNOSIS — M25562 Pain in left knee: Secondary | ICD-10-CM | POA: Diagnosis not present

## 2019-11-29 DIAGNOSIS — C44622 Squamous cell carcinoma of skin of right upper limb, including shoulder: Secondary | ICD-10-CM | POA: Diagnosis not present

## 2019-11-29 DIAGNOSIS — R2681 Unsteadiness on feet: Secondary | ICD-10-CM | POA: Diagnosis not present

## 2019-11-29 DIAGNOSIS — Z03818 Encounter for observation for suspected exposure to other biological agents ruled out: Secondary | ICD-10-CM | POA: Diagnosis not present

## 2019-11-29 DIAGNOSIS — M6281 Muscle weakness (generalized): Secondary | ICD-10-CM | POA: Diagnosis not present

## 2019-11-30 DIAGNOSIS — M6281 Muscle weakness (generalized): Secondary | ICD-10-CM | POA: Diagnosis not present

## 2019-11-30 DIAGNOSIS — R2681 Unsteadiness on feet: Secondary | ICD-10-CM | POA: Diagnosis not present

## 2019-12-01 DIAGNOSIS — M6281 Muscle weakness (generalized): Secondary | ICD-10-CM | POA: Diagnosis not present

## 2019-12-01 DIAGNOSIS — R2681 Unsteadiness on feet: Secondary | ICD-10-CM | POA: Diagnosis not present

## 2019-12-02 DIAGNOSIS — M6281 Muscle weakness (generalized): Secondary | ICD-10-CM | POA: Diagnosis not present

## 2019-12-02 DIAGNOSIS — R2681 Unsteadiness on feet: Secondary | ICD-10-CM | POA: Diagnosis not present

## 2019-12-03 DIAGNOSIS — M6281 Muscle weakness (generalized): Secondary | ICD-10-CM | POA: Diagnosis not present

## 2019-12-03 DIAGNOSIS — R2681 Unsteadiness on feet: Secondary | ICD-10-CM | POA: Diagnosis not present

## 2019-12-05 DIAGNOSIS — M6281 Muscle weakness (generalized): Secondary | ICD-10-CM | POA: Diagnosis not present

## 2019-12-05 DIAGNOSIS — R2681 Unsteadiness on feet: Secondary | ICD-10-CM | POA: Diagnosis not present

## 2019-12-06 DIAGNOSIS — R2681 Unsteadiness on feet: Secondary | ICD-10-CM | POA: Diagnosis not present

## 2019-12-06 DIAGNOSIS — M6281 Muscle weakness (generalized): Secondary | ICD-10-CM | POA: Diagnosis not present

## 2019-12-06 DIAGNOSIS — Z03818 Encounter for observation for suspected exposure to other biological agents ruled out: Secondary | ICD-10-CM | POA: Diagnosis not present

## 2019-12-07 DIAGNOSIS — R2681 Unsteadiness on feet: Secondary | ICD-10-CM | POA: Diagnosis not present

## 2019-12-07 DIAGNOSIS — M6281 Muscle weakness (generalized): Secondary | ICD-10-CM | POA: Diagnosis not present

## 2019-12-07 DIAGNOSIS — Z23 Encounter for immunization: Secondary | ICD-10-CM | POA: Diagnosis not present

## 2019-12-08 DIAGNOSIS — R2681 Unsteadiness on feet: Secondary | ICD-10-CM | POA: Diagnosis not present

## 2019-12-08 DIAGNOSIS — M6281 Muscle weakness (generalized): Secondary | ICD-10-CM | POA: Diagnosis not present

## 2019-12-09 DIAGNOSIS — M6281 Muscle weakness (generalized): Secondary | ICD-10-CM | POA: Diagnosis not present

## 2019-12-09 DIAGNOSIS — R2681 Unsteadiness on feet: Secondary | ICD-10-CM | POA: Diagnosis not present

## 2019-12-10 DIAGNOSIS — M6281 Muscle weakness (generalized): Secondary | ICD-10-CM | POA: Diagnosis not present

## 2019-12-10 DIAGNOSIS — R2681 Unsteadiness on feet: Secondary | ICD-10-CM | POA: Diagnosis not present

## 2019-12-13 DIAGNOSIS — R2681 Unsteadiness on feet: Secondary | ICD-10-CM | POA: Diagnosis not present

## 2019-12-13 DIAGNOSIS — Z03818 Encounter for observation for suspected exposure to other biological agents ruled out: Secondary | ICD-10-CM | POA: Diagnosis not present

## 2019-12-13 DIAGNOSIS — M6281 Muscle weakness (generalized): Secondary | ICD-10-CM | POA: Diagnosis not present

## 2019-12-14 DIAGNOSIS — M6281 Muscle weakness (generalized): Secondary | ICD-10-CM | POA: Diagnosis not present

## 2019-12-14 DIAGNOSIS — R2681 Unsteadiness on feet: Secondary | ICD-10-CM | POA: Diagnosis not present

## 2019-12-15 DIAGNOSIS — R2681 Unsteadiness on feet: Secondary | ICD-10-CM | POA: Diagnosis not present

## 2019-12-15 DIAGNOSIS — M6281 Muscle weakness (generalized): Secondary | ICD-10-CM | POA: Diagnosis not present

## 2019-12-17 DIAGNOSIS — R2681 Unsteadiness on feet: Secondary | ICD-10-CM | POA: Diagnosis not present

## 2019-12-17 DIAGNOSIS — M6281 Muscle weakness (generalized): Secondary | ICD-10-CM | POA: Diagnosis not present

## 2019-12-18 DIAGNOSIS — M6281 Muscle weakness (generalized): Secondary | ICD-10-CM | POA: Diagnosis not present

## 2019-12-18 DIAGNOSIS — R2681 Unsteadiness on feet: Secondary | ICD-10-CM | POA: Diagnosis not present

## 2019-12-19 DIAGNOSIS — M6281 Muscle weakness (generalized): Secondary | ICD-10-CM | POA: Diagnosis not present

## 2019-12-19 DIAGNOSIS — R2681 Unsteadiness on feet: Secondary | ICD-10-CM | POA: Diagnosis not present

## 2019-12-20 DIAGNOSIS — M6281 Muscle weakness (generalized): Secondary | ICD-10-CM | POA: Diagnosis not present

## 2019-12-20 DIAGNOSIS — R2681 Unsteadiness on feet: Secondary | ICD-10-CM | POA: Diagnosis not present

## 2019-12-20 DIAGNOSIS — Z03818 Encounter for observation for suspected exposure to other biological agents ruled out: Secondary | ICD-10-CM | POA: Diagnosis not present

## 2019-12-21 DIAGNOSIS — R600 Localized edema: Secondary | ICD-10-CM | POA: Diagnosis not present

## 2019-12-21 DIAGNOSIS — L853 Xerosis cutis: Secondary | ICD-10-CM | POA: Diagnosis not present

## 2019-12-21 DIAGNOSIS — I739 Peripheral vascular disease, unspecified: Secondary | ICD-10-CM | POA: Diagnosis not present

## 2019-12-21 DIAGNOSIS — B351 Tinea unguium: Secondary | ICD-10-CM | POA: Diagnosis not present

## 2019-12-22 DIAGNOSIS — R2681 Unsteadiness on feet: Secondary | ICD-10-CM | POA: Diagnosis not present

## 2019-12-22 DIAGNOSIS — M6281 Muscle weakness (generalized): Secondary | ICD-10-CM | POA: Diagnosis not present

## 2019-12-24 DIAGNOSIS — R2681 Unsteadiness on feet: Secondary | ICD-10-CM | POA: Diagnosis not present

## 2019-12-24 DIAGNOSIS — M6281 Muscle weakness (generalized): Secondary | ICD-10-CM | POA: Diagnosis not present

## 2019-12-25 DIAGNOSIS — M6281 Muscle weakness (generalized): Secondary | ICD-10-CM | POA: Diagnosis not present

## 2019-12-25 DIAGNOSIS — R2681 Unsteadiness on feet: Secondary | ICD-10-CM | POA: Diagnosis not present

## 2019-12-26 DIAGNOSIS — M6281 Muscle weakness (generalized): Secondary | ICD-10-CM | POA: Diagnosis not present

## 2019-12-26 DIAGNOSIS — R2681 Unsteadiness on feet: Secondary | ICD-10-CM | POA: Diagnosis not present

## 2019-12-27 DIAGNOSIS — R2681 Unsteadiness on feet: Secondary | ICD-10-CM | POA: Diagnosis not present

## 2019-12-27 DIAGNOSIS — M6281 Muscle weakness (generalized): Secondary | ICD-10-CM | POA: Diagnosis not present

## 2019-12-28 DIAGNOSIS — M6281 Muscle weakness (generalized): Secondary | ICD-10-CM | POA: Diagnosis not present

## 2019-12-28 DIAGNOSIS — R2681 Unsteadiness on feet: Secondary | ICD-10-CM | POA: Diagnosis not present

## 2019-12-29 DIAGNOSIS — R2681 Unsteadiness on feet: Secondary | ICD-10-CM | POA: Diagnosis not present

## 2019-12-29 DIAGNOSIS — M6281 Muscle weakness (generalized): Secondary | ICD-10-CM | POA: Diagnosis not present

## 2019-12-30 DIAGNOSIS — Z03818 Encounter for observation for suspected exposure to other biological agents ruled out: Secondary | ICD-10-CM | POA: Diagnosis not present

## 2019-12-30 DIAGNOSIS — M6281 Muscle weakness (generalized): Secondary | ICD-10-CM | POA: Diagnosis not present

## 2019-12-30 DIAGNOSIS — R2681 Unsteadiness on feet: Secondary | ICD-10-CM | POA: Diagnosis not present

## 2020-01-01 DIAGNOSIS — M6281 Muscle weakness (generalized): Secondary | ICD-10-CM | POA: Diagnosis not present

## 2020-01-01 DIAGNOSIS — R2681 Unsteadiness on feet: Secondary | ICD-10-CM | POA: Diagnosis not present

## 2020-01-04 DIAGNOSIS — M6281 Muscle weakness (generalized): Secondary | ICD-10-CM | POA: Diagnosis not present

## 2020-01-04 DIAGNOSIS — R2681 Unsteadiness on feet: Secondary | ICD-10-CM | POA: Diagnosis not present

## 2020-01-05 ENCOUNTER — Non-Acute Institutional Stay: Payer: Medicare Other | Admitting: Primary Care

## 2020-01-05 ENCOUNTER — Other Ambulatory Visit: Payer: Self-pay

## 2020-01-05 DIAGNOSIS — Z515 Encounter for palliative care: Secondary | ICD-10-CM

## 2020-01-05 DIAGNOSIS — M6281 Muscle weakness (generalized): Secondary | ICD-10-CM | POA: Diagnosis not present

## 2020-01-05 DIAGNOSIS — H903 Sensorineural hearing loss, bilateral: Secondary | ICD-10-CM | POA: Diagnosis not present

## 2020-01-05 DIAGNOSIS — R2681 Unsteadiness on feet: Secondary | ICD-10-CM | POA: Diagnosis not present

## 2020-01-05 NOTE — Progress Notes (Signed)
Manufacturing engineer., Community Palliative Care Consult Note Telephone: [phone]  Fax: [phone]  PATIENT NAME: Laurie Fuentes [address] [place] [address] [phone] (home)  DOB: [birth year] MRN: [id number]  PRIMARY CARE PROVIDER:  Alvester Morin, MD [address] [place] [address]  REFERRING PROVIDER: Alvester Morin, MD [address]. [place],  [place] [address] [phone]  RESPONSIBLE PARTY:   Extended Emergency Contact Information Primary Emergency Contact: Laurie Fuentes [place] Mobile Phone: [phone] Relation: Daughter Secondary Emergency Contact: Laurie Fuentes Mobile Phone: [phone] Relation: Yolanda Bonine   I met with [name] in her nursing home; this was my first visit with her. ASSESSMENT AND RECOMMENDATIONS:   1. Advance Care Planning/Goals of Care: Goals include to maximize quality of life and symptom management.  I spoke by phone with her power of attorney/ daughter Laurie Fuentes, who is her only living child,  who states that she is unable to care for her in her home due to her own  health.She was not aware that visitation and re-began and so she was going to call and make an appointment to do that.   2. Symptom Management:   HOH: Patient has been to the audiologist and needs new hearing aid. She was unable to participate in the interview today due to deafness. POA stated that her mother does have extreme  hearing loss and was looking forward to improvement with a new hearing aid. She was having her lunch and was not able to interact much due to hearing deficit.   Dementia: She's had some episodes of agitation recently. Staff reported they do not feel that they are associated with a urine infection since she can be easily calmed and redirected.   3. Family /Caregiver/Community Supports: Daughter is POA, has several area grand children. Resident of LTC.  4. Cognitive /  Functional decline:  Alert, oriented x 1. Non verbal due to hearing loss. Feeding self lunch. Needs assistance and cueing of most adls, all iadls.   5. Follow up Palliative Care Visit: Palliative care will continue to follow for goals of care clarification and symptom management. Return 4-6 weeks or prn.  I spent 25 minutes providing this consultation,  from 1130 to 1155. More than 50% of the time in this consultation was spent coordinating communication.   HISTORY OF PRESENT ILLNESS:  Laurie Fuentes is a 86 y.o. year old female with multiple medical problems including hearing loss, HTN, dementia, Chronic atrial fibrillation, s/p CVA. Palliative Care was asked to follow this patient by consultation request of Slade-Hartman, Ivette Loyal* to help address advance care planning and goals of care. This is a follow up visit.  CODE STATUS: DNR  PPS: 40% HOSPICE ELIGIBILITY/DIAGNOSIS: TBD  PAST MEDICAL HISTORY:  Past Medical History:  Diagnosis Date  . GERD (gastroesophageal reflux disease)   . Hypertension   . Hypokalemia   . Stroke [facility])     SOCIAL HX:  Social History   Tobacco Use  . Smoking status: Never Smoker  . Smokeless tobacco: Never Used  Substance Use Topics  . Alcohol use: No    Alcohol/week: 0.0 standard drinks    ALLERGIES: No Known Allergies   PERTINENT MEDICATIONS:  Outpatient Encounter Medications as of 01/05/2020  Medication Sig  . acetaminophen (TYLENOL) 500 MG tablet Take 500-1,000 mg by mouth every 6 (six) hours as needed for mild pain or fever.   Marland Kitchen amLODipine (NORVASC) 5 MG tablet Take 1 tablet (5 mg total)  by mouth daily.  . Melatonin 3 MG TABS Take 1 tablet by mouth at bedtime.  . naproxen sodium (ANAPROX) 275 MG tablet Take 550 mg by mouth 2 (two) times daily with a meal.  . polyethylene glycol (MIRALAX / GLYCOLAX) 17 g packet Take 17 g by mouth daily.  . sennosides-docusate sodium (SENOKOT-S) 8.6-50 MG tablet Take 2 tablets by mouth daily. AT BEDTIME  . feeding  supplement, ENSURE ENLIVE, (ENSURE ENLIVE) LIQD Take 237 mLs by mouth 2 (two) times daily between meals.  . [DISCONTINUED] docusate sodium (COLACE) 100 MG capsule Take 2 capsules (200 mg total) by mouth 2 (two) times daily.  . [DISCONTINUED] megestrol (MEGACE) 400 MG/10ML suspension Take 20 mLs (800 mg total) by mouth daily.  . [DISCONTINUED] modafinil (PROVIGIL) 100 MG tablet Take 1 tablet (100 mg total) by mouth daily.   No facility-administered encounter medications on file as of 01/05/2020.    PHYSICAL EXAM / ROS:   Current and past weights: 120 lbs early Dec, 2020. Current unavailable General: NAD, frail appearing, thin Cardiovascular: no chest pain reported, no edema,  Pulmonary: no cough, no increased SOB, room air Abdomen: appetite fair, denies constipation, continent of bowel at times GU: denies dysuria, continent of urine at times  MSK:  no joint deformities, ambulatory with assistance (device and stand by) Skin: right arm wrapped. Neurological: Weakness, dementia, periods of agitation  Jason Coop, NP [facility]  COVID-19 PATIENT SCREENING TOOL  Person answering questions: _______staff____________ _____   1.  Is the patient or any family member in the home showing any signs or symptoms regarding respiratory infection?               Person with Symptom- __________NA_________________  a. Fever                                                                          Yes___ No___          ___________________  b. Shortness of breath                                                    Yes___ No___          ___________________ c. Cough/congestion                                       Yes___  No___         ___________________ d. Body aches/pains                                                         Yes___ No___        ____________________ e. Gastrointestinal symptoms (diarrhea, nausea)           Yes___ No___        ____________________  2. Within the  past 14 days, has  anyone living in the home had any contact with someone with or under investigation for COVID-19?    Yes___ No_x_   Person __________________

## 2020-01-06 DIAGNOSIS — Z03818 Encounter for observation for suspected exposure to other biological agents ruled out: Secondary | ICD-10-CM | POA: Diagnosis not present

## 2020-01-06 DIAGNOSIS — M6281 Muscle weakness (generalized): Secondary | ICD-10-CM | POA: Diagnosis not present

## 2020-01-06 DIAGNOSIS — R2681 Unsteadiness on feet: Secondary | ICD-10-CM | POA: Diagnosis not present

## 2020-01-07 DIAGNOSIS — M6281 Muscle weakness (generalized): Secondary | ICD-10-CM | POA: Diagnosis not present

## 2020-01-07 DIAGNOSIS — R2681 Unsteadiness on feet: Secondary | ICD-10-CM | POA: Diagnosis not present

## 2020-01-08 DIAGNOSIS — M6281 Muscle weakness (generalized): Secondary | ICD-10-CM | POA: Diagnosis not present

## 2020-01-08 DIAGNOSIS — R2681 Unsteadiness on feet: Secondary | ICD-10-CM | POA: Diagnosis not present

## 2020-01-10 DIAGNOSIS — M6281 Muscle weakness (generalized): Secondary | ICD-10-CM | POA: Diagnosis not present

## 2020-01-10 DIAGNOSIS — R2681 Unsteadiness on feet: Secondary | ICD-10-CM | POA: Diagnosis not present

## 2020-01-11 DIAGNOSIS — R2681 Unsteadiness on feet: Secondary | ICD-10-CM | POA: Diagnosis not present

## 2020-01-11 DIAGNOSIS — M6281 Muscle weakness (generalized): Secondary | ICD-10-CM | POA: Diagnosis not present

## 2020-01-13 DIAGNOSIS — H906 Mixed conductive and sensorineural hearing loss, bilateral: Secondary | ICD-10-CM | POA: Diagnosis not present

## 2020-01-13 DIAGNOSIS — H65191 Other acute nonsuppurative otitis media, right ear: Secondary | ICD-10-CM | POA: Diagnosis not present

## 2020-01-14 DIAGNOSIS — R2681 Unsteadiness on feet: Secondary | ICD-10-CM | POA: Diagnosis not present

## 2020-01-14 DIAGNOSIS — M6281 Muscle weakness (generalized): Secondary | ICD-10-CM | POA: Diagnosis not present

## 2020-01-15 DIAGNOSIS — R2681 Unsteadiness on feet: Secondary | ICD-10-CM | POA: Diagnosis not present

## 2020-01-15 DIAGNOSIS — M6281 Muscle weakness (generalized): Secondary | ICD-10-CM | POA: Diagnosis not present

## 2020-01-16 DIAGNOSIS — M6281 Muscle weakness (generalized): Secondary | ICD-10-CM | POA: Diagnosis not present

## 2020-01-16 DIAGNOSIS — R2681 Unsteadiness on feet: Secondary | ICD-10-CM | POA: Diagnosis not present

## 2020-01-17 DIAGNOSIS — M6281 Muscle weakness (generalized): Secondary | ICD-10-CM | POA: Diagnosis not present

## 2020-01-17 DIAGNOSIS — R2681 Unsteadiness on feet: Secondary | ICD-10-CM | POA: Diagnosis not present

## 2020-01-18 DIAGNOSIS — R2681 Unsteadiness on feet: Secondary | ICD-10-CM | POA: Diagnosis not present

## 2020-01-18 DIAGNOSIS — M6281 Muscle weakness (generalized): Secondary | ICD-10-CM | POA: Diagnosis not present

## 2020-01-19 DIAGNOSIS — R2681 Unsteadiness on feet: Secondary | ICD-10-CM | POA: Diagnosis not present

## 2020-01-19 DIAGNOSIS — M6281 Muscle weakness (generalized): Secondary | ICD-10-CM | POA: Diagnosis not present

## 2020-01-20 DIAGNOSIS — R2681 Unsteadiness on feet: Secondary | ICD-10-CM | POA: Diagnosis not present

## 2020-01-20 DIAGNOSIS — M6281 Muscle weakness (generalized): Secondary | ICD-10-CM | POA: Diagnosis not present

## 2020-01-21 DIAGNOSIS — R2681 Unsteadiness on feet: Secondary | ICD-10-CM | POA: Diagnosis not present

## 2020-01-21 DIAGNOSIS — M6281 Muscle weakness (generalized): Secondary | ICD-10-CM | POA: Diagnosis not present

## 2020-01-24 DIAGNOSIS — R2681 Unsteadiness on feet: Secondary | ICD-10-CM | POA: Diagnosis not present

## 2020-01-24 DIAGNOSIS — M6281 Muscle weakness (generalized): Secondary | ICD-10-CM | POA: Diagnosis not present

## 2020-01-25 DIAGNOSIS — M6281 Muscle weakness (generalized): Secondary | ICD-10-CM | POA: Diagnosis not present

## 2020-01-25 DIAGNOSIS — R2681 Unsteadiness on feet: Secondary | ICD-10-CM | POA: Diagnosis not present

## 2020-01-26 DIAGNOSIS — M6281 Muscle weakness (generalized): Secondary | ICD-10-CM | POA: Diagnosis not present

## 2020-01-26 DIAGNOSIS — R2681 Unsteadiness on feet: Secondary | ICD-10-CM | POA: Diagnosis not present

## 2020-01-27 DIAGNOSIS — I480 Paroxysmal atrial fibrillation: Secondary | ICD-10-CM | POA: Diagnosis not present

## 2020-01-27 DIAGNOSIS — M6281 Muscle weakness (generalized): Secondary | ICD-10-CM | POA: Diagnosis not present

## 2020-01-27 DIAGNOSIS — Z9229 Personal history of other drug therapy: Secondary | ICD-10-CM | POA: Diagnosis not present

## 2020-01-27 DIAGNOSIS — F0150 Vascular dementia without behavioral disturbance: Secondary | ICD-10-CM | POA: Diagnosis not present

## 2020-01-27 DIAGNOSIS — I6389 Other cerebral infarction: Secondary | ICD-10-CM | POA: Diagnosis not present

## 2020-01-27 DIAGNOSIS — I10 Essential (primary) hypertension: Secondary | ICD-10-CM | POA: Diagnosis not present

## 2020-01-27 DIAGNOSIS — R2681 Unsteadiness on feet: Secondary | ICD-10-CM | POA: Diagnosis not present

## 2020-01-28 DIAGNOSIS — M6281 Muscle weakness (generalized): Secondary | ICD-10-CM | POA: Diagnosis not present

## 2020-01-28 DIAGNOSIS — R2681 Unsteadiness on feet: Secondary | ICD-10-CM | POA: Diagnosis not present

## 2020-01-31 DIAGNOSIS — D649 Anemia, unspecified: Secondary | ICD-10-CM | POA: Diagnosis not present

## 2020-01-31 DIAGNOSIS — E039 Hypothyroidism, unspecified: Secondary | ICD-10-CM | POA: Diagnosis not present

## 2020-01-31 DIAGNOSIS — M6281 Muscle weakness (generalized): Secondary | ICD-10-CM | POA: Diagnosis not present

## 2020-01-31 DIAGNOSIS — R2681 Unsteadiness on feet: Secondary | ICD-10-CM | POA: Diagnosis not present

## 2020-01-31 DIAGNOSIS — Z79899 Other long term (current) drug therapy: Secondary | ICD-10-CM | POA: Diagnosis not present

## 2020-02-01 DIAGNOSIS — R2681 Unsteadiness on feet: Secondary | ICD-10-CM | POA: Diagnosis not present

## 2020-02-01 DIAGNOSIS — M6281 Muscle weakness (generalized): Secondary | ICD-10-CM | POA: Diagnosis not present

## 2020-02-02 DIAGNOSIS — M6281 Muscle weakness (generalized): Secondary | ICD-10-CM | POA: Diagnosis not present

## 2020-02-02 DIAGNOSIS — R2681 Unsteadiness on feet: Secondary | ICD-10-CM | POA: Diagnosis not present

## 2020-02-03 DIAGNOSIS — M6281 Muscle weakness (generalized): Secondary | ICD-10-CM | POA: Diagnosis not present

## 2020-02-03 DIAGNOSIS — R2681 Unsteadiness on feet: Secondary | ICD-10-CM | POA: Diagnosis not present

## 2020-02-04 DIAGNOSIS — R2681 Unsteadiness on feet: Secondary | ICD-10-CM | POA: Diagnosis not present

## 2020-02-04 DIAGNOSIS — M6281 Muscle weakness (generalized): Secondary | ICD-10-CM | POA: Diagnosis not present

## 2020-02-07 DIAGNOSIS — R2681 Unsteadiness on feet: Secondary | ICD-10-CM | POA: Diagnosis not present

## 2020-02-07 DIAGNOSIS — M6281 Muscle weakness (generalized): Secondary | ICD-10-CM | POA: Diagnosis not present

## 2020-02-08 DIAGNOSIS — R2681 Unsteadiness on feet: Secondary | ICD-10-CM | POA: Diagnosis not present

## 2020-02-08 DIAGNOSIS — M6281 Muscle weakness (generalized): Secondary | ICD-10-CM | POA: Diagnosis not present

## 2020-02-09 DIAGNOSIS — R2681 Unsteadiness on feet: Secondary | ICD-10-CM | POA: Diagnosis not present

## 2020-02-09 DIAGNOSIS — M6281 Muscle weakness (generalized): Secondary | ICD-10-CM | POA: Diagnosis not present

## 2020-02-10 DIAGNOSIS — R2681 Unsteadiness on feet: Secondary | ICD-10-CM | POA: Diagnosis not present

## 2020-02-10 DIAGNOSIS — M6281 Muscle weakness (generalized): Secondary | ICD-10-CM | POA: Diagnosis not present

## 2020-02-12 DIAGNOSIS — R2681 Unsteadiness on feet: Secondary | ICD-10-CM | POA: Diagnosis not present

## 2020-02-12 DIAGNOSIS — M6281 Muscle weakness (generalized): Secondary | ICD-10-CM | POA: Diagnosis not present

## 2020-02-23 ENCOUNTER — Other Ambulatory Visit: Payer: Self-pay

## 2020-02-23 ENCOUNTER — Non-Acute Institutional Stay: Payer: Medicare Other | Admitting: Primary Care

## 2020-02-23 DIAGNOSIS — Z515 Encounter for palliative care: Secondary | ICD-10-CM | POA: Diagnosis not present

## 2020-02-23 NOTE — Progress Notes (Signed)
[facility] Consult Note Telephone: [phone]  Fax: [phone]    PATIENT NAME: Laurie Fuentes [address] [place] [address] [phone] (home)  DOB: 1926-06-11 MRN: [id number]  PRIMARY CARE PROVIDER:   Alvester Morin, MD, [place]. [place] [address] [phone]  REFERRING PROVIDER:  Alvester Morin, MD [place]. [place],  [place] [address] [phone]  RESPONSIBLE PARTY:   Extended Emergency Contact Information Primary Emergency Contact: Laurie Fuentes States of [place] Mobile Phone: [phone] Relation: Daughter Secondary Emergency Contact: Laurie Fuentes Mobile Phone: [phone] Relation: Yolanda Bonine   I met with patient at facility.  ASSESSMENT AND RECOMMENDATIONS:   1. Advance Care Planning/Goals of Care: Goals include to maximize quality of life and symptom management. DNR on file.  2. Symptom Management:   I visited with Laurie Fuentes in the nursing home today. She was taking herself to the bathroom in a wheelchair and able to transfer with some standby assistance to the toilet and toilet herself. She had eaten about half her lunch. Her weight has been fluctuating down but only a few pounds less than her normal fluctuation. I spoke with her family member  Laurie Fuentes about care decisions. She states her aunt is next of kin but that she was taking care of patient prior to nursing home placement.   Recommend hearing aid assessment: She also needs hearing assessment. Granddaughter states there are hearing aids in the facility but I did not look for them in her room today. I will find out if they are locked up for safety or in her bedside stand and if they need servicing will let granddaughter know. I encouraged her to call with any questions or concerns in the future.  3. Family /Caregiver/Community Supports: Laurie Fuentes is POA, was not able to reach as her phone did  not take messages. Lives in [place].  4. Cognitive / Functional decline: Very HOH, alert, oriented x 1. Able to mobilize in w/c and take self to bathroom. Can feed self.  5. Follow up Palliative Care Visit: Palliative care will continue to follow for goals of care clarification and symptom management. Return 6-8 weeks or prn.  I spent 25 minutes providing this consultation,  from 1000 to 1025. More than 50% of the time in this consultation was spent coordinating communication.   HISTORY OF PRESENT ILLNESS:  Laurie Fuentes is a 86 y.o. year old female with multiple medical problems including HTN, CVA, HOH. Palliative Care was asked to follow this patient by consultation request of Laurie Fuentes, Laurie Fuentes* to help address advance care planning and goals of care. This is a follow up visit.  CODE STATUS: DNR  PPS: 40% HOSPICE ELIGIBILITY/DIAGNOSIS: TBD  PAST MEDICAL HISTORY:  Past Medical History:  Diagnosis Date  . GERD (gastroesophageal reflux disease)   . Hypertension   . Hypokalemia   . Stroke [facility])     SOCIAL HX:  Social History   Tobacco Use  . Smoking status: Never Smoker  . Smokeless tobacco: Never Used  Substance Use Topics  . Alcohol use: No    Alcohol/week: 0.0 standard drinks    ALLERGIES: No Known Allergies   PERTINENT MEDICATIONS:  Outpatient Encounter Medications as of 84/21/2021  Medication Sig  . acetaminophen (TYLENOL) 500 MG tablet Take 500-1,000 mg by mouth every 6 (six) hours as needed for mild pain or fever.   Marland Kitchen amLODipine (NORVASC) 5 MG tablet Take 1 tablet (5 mg total) by mouth  daily.  . feeding supplement, ENSURE ENLIVE, (ENSURE ENLIVE) LIQD Take 237 mLs by mouth 2 (two) times daily between meals.  . Melatonin 3 MG TABS Take 1 tablet by mouth at bedtime.  . modafinil (PROVIGIL) 100 MG tablet Take 100 mg by mouth daily.  . polyethylene glycol (MIRALAX / GLYCOLAX) 17 g packet Take 17 g by mouth daily.  . sennosides-docusate sodium (SENOKOT-S) 8.6-50 MG tablet  Take 2 tablets by mouth daily.  . [DISCONTINUED] sennosides-docusate sodium (SENOKOT-S) 8.6-50 MG tablet Take 2 tablets by mouth daily. AT BEDTIME  . naproxen sodium (ANAPROX) 275 MG tablet Take 550 mg by mouth 2 (two) times daily with a meal.   No facility-administered encounter medications on file as of 84/21/2021.    PHYSICAL EXAM / ROS:   Current and past weights:  Current weight: 116 lbs, 120.4 lbs, March; 122. 4 lbs Feb; Wt on admission 115.6 lbs. Sept 119.lbs from Epic record. General: NAD, frail appearing, thin Cardiovascular:S1s2, no chest pain reported, no edema in LE Pulmonary: no cough, no increased SOB, LCTA Abdomen: appetite fair, intake 25%-50%, deniesconstipation, continent of bowel GU: denies dysuria, continent of urine MSK:  no joint deformities, non ambulatory, uses w/c Skin: no rashes or wounds reported Neurological: Weakness, Very HOH, no pain reported  Laurie Coop, NP [facility]  COVID-19 PATIENT SCREENING TOOL  Person answering questions: ____________Staff_______ _____   1.  Is the patient or any family member in the home showing any signs or symptoms regarding respiratory infection?               Person with Symptom- __________NA_________________  a. Fever                                                                          Yes___ No___          ___________________  b. Shortness of breath                                                    Yes___ No___          ___________________ c. Cough/congestion                                       Yes___  No___         ___________________ d. Body aches/pains                                                         Yes___ No___        ____________________ e. Gastrointestinal symptoms (diarrhea, nausea)           Yes___ No___        ____________________  2. Within the past 14 days, has anyone living in the home had any contact with someone with or under investigation  for COVID-19?    Yes___ No_X_   Person  __________________

## 2020-02-24 DIAGNOSIS — Z03818 Encounter for observation for suspected exposure to other biological agents ruled out: Secondary | ICD-10-CM | POA: Diagnosis not present

## 2020-02-29 DIAGNOSIS — Z03818 Encounter for observation for suspected exposure to other biological agents ruled out: Secondary | ICD-10-CM | POA: Diagnosis not present

## 2020-03-14 DIAGNOSIS — K219 Gastro-esophageal reflux disease without esophagitis: Secondary | ICD-10-CM | POA: Diagnosis not present

## 2020-03-14 DIAGNOSIS — I10 Essential (primary) hypertension: Secondary | ICD-10-CM | POA: Diagnosis not present

## 2020-03-14 DIAGNOSIS — S065X9A Traumatic subdural hemorrhage with loss of consciousness of unspecified duration, initial encounter: Secondary | ICD-10-CM | POA: Diagnosis not present

## 2020-03-14 DIAGNOSIS — C44622 Squamous cell carcinoma of skin of right upper limb, including shoulder: Secondary | ICD-10-CM | POA: Diagnosis not present

## 2020-03-14 DIAGNOSIS — I82409 Acute embolism and thrombosis of unspecified deep veins of unspecified lower extremity: Secondary | ICD-10-CM | POA: Diagnosis not present

## 2020-03-14 DIAGNOSIS — K5901 Slow transit constipation: Secondary | ICD-10-CM | POA: Diagnosis not present

## 2020-03-14 DIAGNOSIS — E43 Unspecified severe protein-calorie malnutrition: Secondary | ICD-10-CM | POA: Diagnosis not present

## 2020-03-14 DIAGNOSIS — I480 Paroxysmal atrial fibrillation: Secondary | ICD-10-CM | POA: Diagnosis not present

## 2020-03-14 DIAGNOSIS — S42001A Fracture of unspecified part of right clavicle, initial encounter for closed fracture: Secondary | ICD-10-CM | POA: Diagnosis not present

## 2020-03-14 DIAGNOSIS — F0150 Vascular dementia without behavioral disturbance: Secondary | ICD-10-CM | POA: Diagnosis not present

## 2020-03-14 DIAGNOSIS — I2510 Atherosclerotic heart disease of native coronary artery without angina pectoris: Secondary | ICD-10-CM | POA: Diagnosis not present

## 2020-03-14 DIAGNOSIS — I6389 Other cerebral infarction: Secondary | ICD-10-CM | POA: Diagnosis not present

## 2020-04-05 ENCOUNTER — Non-Acute Institutional Stay: Payer: Medicare Other | Admitting: Primary Care

## 2020-04-05 ENCOUNTER — Other Ambulatory Visit: Payer: Self-pay

## 2020-04-05 DIAGNOSIS — I679 Cerebrovascular disease, unspecified: Secondary | ICD-10-CM | POA: Diagnosis not present

## 2020-04-05 DIAGNOSIS — Z515 Encounter for palliative care: Secondary | ICD-10-CM

## 2020-04-05 NOTE — Progress Notes (Signed)
[facility] Consult Note Telephone: [phone]  Fax: [phone]  PATIENT NAME: Laurie Fuentes [address] [place] [address] [phone] (home)  DOB: [birth year] MRN: [id number]  PRIMARY CARE PROVIDER:    Alvester Morin, MD,  [place]. [place] [address] [phone]  REFERRING PROVIDER:   Alvester Morin, MD [place]. [place],  [place] [address] [phone]  RESPONSIBLE PARTY:   Extended Emergency Contact Information Primary Emergency Contact: Laurie Fuentes States of [place] Mobile Phone: [phone] Relation: Daughter Secondary Emergency Contact: Laurie Fuentes Mobile Phone: [phone] Relation: Laurie Fuentes  I met with patient in the  facility.  ASSESSMENT AND RECOMMENDATIONS:   1. Advance Care Planning/Goals of Care: Goals include to maximize quality of life and symptom management. DNR on file.  2. Symptom Management:   I met with Laurie Fuentes in her nursing home room. Staff reports she had her hearing aids serviced and it was clear she could hear much better then at our last interview. She had had lunch and was sitting in a chair. She endorsed pain in her left leg and knee. Staff states that radiologic studies recently done were negative for fracture. She has some swelling and a wound also in the left lower leg. I would recommend around the clock acetaminophen 500 mg every eight hours in addition to her Naproxen. All meds reviewed with nursing home staff. Pain mgmt: Recommend 500 mg q 8 hr ATC and prn  3. Family /Caregiver/Community Supports: Daughter is POA, attempted to phone but no answer and no ability to leave a message.   4. Cognitive / Functional decline: a and o x 1. Staff reports she attempts to get to bathroom on her own. Denies falls. Needs help with many alds, all iadls.   5. Follow up Palliative Care Visit: Palliative care will continue to follow  for goals of care clarification and symptom management. Return 6-8 weeks or prn.  I spent 35 minutes providing this consultation,  from 1035 to 1110. More than 50% of the time in this consultation was spent coordinating communication.   HISTORY OF PRESENT ILLNESS:  Laurie Fuentes is a 86 y.o. year old female with multiple medical problems including HTN, CVA, HOH. Palliative Care was asked to follow this patient by consultation request of Slade-Hartman, Ivette Loyal* to help address advance care planning and goals of care. This is a follow up visit.  CODE STATUS: DNR  PPS: 30% HOSPICE ELIGIBILITY/DIAGNOSIS: TBD  PAST MEDICAL HISTORY:  Past Medical History:  Diagnosis Date  . GERD (gastroesophageal reflux disease)   . Hypertension   . Hypokalemia   . Stroke [facility])     SOCIAL HX:  Social History   Tobacco Use  . Smoking status: Never Smoker  . Smokeless tobacco: Never Used  Substance Use Topics  . Alcohol use: No    Alcohol/week: 0.0 standard drinks    ALLERGIES: No Known Allergies   PERTINENT MEDICATIONS:  Outpatient Encounter Medications as of 04/05/2020  Medication Sig  . amLODipine (NORVASC) 5 MG tablet Take 1 tablet (5 mg total) by mouth daily.  Marland Kitchen acetaminophen (TYLENOL) 500 MG tablet Take 500-1,000 mg by mouth every 6 (six) hours as needed for mild pain or fever.   . feeding supplement, ENSURE ENLIVE, (ENSURE ENLIVE) LIQD Take 237 mLs by mouth 2 (two) times daily between meals.  . Melatonin 3 MG TABS Take 1 tablet by mouth at bedtime.  . naproxen sodium (ANAPROX) 275  MG tablet Take 550 mg by mouth 2 (two) times daily with a meal.  . polyethylene glycol (MIRALAX / GLYCOLAX) 17 g packet Take 17 g by mouth daily.  . sennosides-docusate sodium (SENOKOT-S) 8.6-50 MG tablet Take 2 tablets by mouth daily.  . [DISCONTINUED] modafinil (PROVIGIL) 100 MG tablet Take 100 mg by mouth daily.   No facility-administered encounter medications on file as of 04/05/2020.    PHYSICAL EXAM / ROS:     Current and past weights: 118.8 lbs, Jan 2021-122.4;  3% loss General: NAD, frail appearing, thin, very HOH Cardiovascular: no chest pain reported, no edema  Pulmonary: no cough, no increased SOB, room air Abdomen: appetite fair,  Denies constipation, continent of bowel GU: denies dysuria, continent of urine MSK:  + joint and ROM abnormalities, non-ambulatory, can transfer with help Skin: hand lesion of skin cancer, L pretibial wound Neurological: Weakness, HOH, endorses Left knee pain, no insomnia  Laurie Coop, NP [facility]  COVID-19 PATIENT SCREENING TOOL  Person answering questions: ____________Staff_______ _____   1.  Is the patient or any family member in the home showing any signs or symptoms regarding respiratory infection?               Person with Symptom- __________NA_________________  a. Fever                                                                          Yes___ No___          ___________________  b. Shortness of breath                                                    Yes___ No___          ___________________ c. Cough/congestion                                       Yes___  No___         ___________________ d. Body aches/pains                                                         Yes___ No___        ____________________ e. Gastrointestinal symptoms (diarrhea, nausea)           Yes___ No___        ____________________  2. Within the past 14 days, has anyone living in the home had any contact with someone with or under investigation for COVID-19?    Yes___ No_X_   Person __________________

## 2020-05-15 DIAGNOSIS — Z03818 Encounter for observation for suspected exposure to other biological agents ruled out: Secondary | ICD-10-CM | POA: Diagnosis not present

## 2020-05-17 DIAGNOSIS — S81812A Laceration without foreign body, left lower leg, initial encounter: Secondary | ICD-10-CM | POA: Diagnosis not present

## 2020-05-22 DIAGNOSIS — Z03818 Encounter for observation for suspected exposure to other biological agents ruled out: Secondary | ICD-10-CM | POA: Diagnosis not present

## 2020-05-26 ENCOUNTER — Other Ambulatory Visit: Payer: Self-pay

## 2020-05-26 ENCOUNTER — Non-Acute Institutional Stay: Payer: Medicare Other | Admitting: Primary Care

## 2020-05-26 DIAGNOSIS — I679 Cerebrovascular disease, unspecified: Secondary | ICD-10-CM | POA: Diagnosis not present

## 2020-05-26 DIAGNOSIS — Z515 Encounter for palliative care: Secondary | ICD-10-CM

## 2020-05-26 NOTE — Progress Notes (Signed)
[facility] Consult Note Telephone: [phone]  Fax: [phone]  PATIENT NAME: Laurie Fuentes [address] [place] [address] [phone] (home)  DOB: [birth year] MRN: [id number]  PRIMARY CARE PROVIDER:    Alvester Morin, MD,  [place]. [place] [address] [phone]  REFERRING PROVIDER:   Alvester Morin, MD [place]. [place],  [place] [address] [phone]  RESPONSIBLE PARTY:   Extended Emergency Contact Information Primary Emergency Contact: Ivor Costa States of [place] Mobile Phone: [phone] Relation: Daughter Secondary Emergency Contact: MayLynnae Sandhoff Mobile Phone: [phone] Relation: Yolanda Bonine  I met face to face with patient in the facility.  ASSESSMENT AND RECOMMENDATIONS:   1. Advance Care Planning/Goals of Care: Goals include to maximize quality of life and symptom management. DNR on file. T/c to [facility] to discuss any questions or concerns in the context of  goals of care. She has concerns about multiple lost hearing aids.   2. Symptom Management:   Pain: Endorses pain in hands and leg. She is currently on ATC naproxyn. This is likely arthritic and chronic. Is not sufficiently Rx with NSAID.  Recommend 25 mg tramadol prn tid for unrelieved pain.Recommend ATC acetaminophen.   Hearing: Needs hearing aid replacement. Email to SW to inquire. Today aid is lost again; last visit she had it and heard much better. Daughter states SNF was to replace lost hearing aid.Recommend a large storage box so it won't be lost again, once replaced.  Pt has significant hearing loss but only uses aid in right ear.  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 Endorses pain, see above. Also  Has great LE edema in LLE, Recommend elevation of LE while sitting to decrease edema and wounds.  3. Family  /Caregiver/Community Supports: Daughter is POA. Lives in [place].  4. Cognitive / Functional decline:  A and O x 1, staff reports she continues to become agitated when roommate uses the bathroom.   5. Follow up Palliative Care Visit: Palliative care will continue to follow for goals of care clarification and symptom management. Return 6-8 weeks or prn.  I spent 35 minutes providing this consultation,  from 1135 to 1210. More than 50% of the time in this consultation was spent coordinating communication.   CHIEF COMPLAINT: HOH, loss of QOL, pain  HISTORY OF PRESENT ILLNESS: Laurie Fuentes is a 86 y.o. year old female with multiple medical problems including HTN, CVA, HOH. Palliative Care was asked to follow this patient by consultation request of Slade-Hartman, Ivette Loyal* to help address advance care planning and goals of care. This is a follow up visit.  CODE STATUS: DNR  PPS: 50%  HOSPICE ELIGIBILITY/DIAGNOSIS: no  PAST MEDICAL HISTORY:  Past Medical History:  Diagnosis Date  . GERD (gastroesophageal reflux disease)   . Hypertension   . Hypokalemia   . Stroke [facility])     SOCIAL HX:  Social History   Tobacco Use  . Smoking status: Never Smoker  . Smokeless tobacco: Never Used  Substance Use Topics  . Alcohol use: No    Alcohol/week: 0.0 standard drinks   FAMILY HX:  Family History  Problem Relation Age of Onset  . Diabetes Sister     ALLERGIES: No Known Allergies   PERTINENT MEDICATIONS:  Outpatient Encounter Medications as of 05/26/2020  Medication Sig  . acetaminophen (TYLENOL) 500 MG tablet Take 500-1,000 mg by mouth every 6 (  six) hours as needed for mild pain or fever.   Marland Kitchen amLODipine (NORVASC) 5 MG tablet Take 1 tablet (5 mg total) by mouth daily.  . feeding supplement, ENSURE ENLIVE, (ENSURE ENLIVE) LIQD Take 237 mLs by mouth 2 (two) times daily between meals.  . Melatonin 3 MG TABS Take 1 tablet by mouth at bedtime.  . naproxen sodium (ANAPROX) 275 MG tablet Take 550  mg by mouth 2 (two) times daily with a meal.  . polyethylene glycol (MIRALAX / GLYCOLAX) 17 g packet Take 17 g by mouth daily.  . sennosides-docusate sodium (SENOKOT-S) 8.6-50 MG tablet Take 2 tablets by mouth daily.  . [DISCONTINUED] ammonium lactate (AMLACTIN) 12 % cream Apply topically as needed for dry skin.  . [DISCONTINUED] pantoprazole (PROTONIX) 40 MG tablet Take 40 mg by mouth daily.   No facility-administered encounter medications on file as of 05/26/2020.    PHYSICAL EXAM / ROS:   Current and past weights: 123.6 lbs current, 122.4 lbs January 2021 General: NAD, frail appearing, thin HEENT: HOH Cardiovascular: no chest pain reported, S1S2,  4/6 murmur, R 2+ edema, L 4 + edema  Pulmonary: no cough, no increased SOB, LCTA bil, room air Abdomen: appetite good, denies constipation, continent of bowel GU: denies dysuria, incontinent of urine at times MSK:  Arthritic  joint and ROM abnormalities,non  Ambulatory, up in chair Skin: lesions on R hand, tears on LE Neurological: Weakness, advanced dementia, HOH (sensorial loss)  Jason Coop, NP , DNP, MPH, [facility]  COVID-19 PATIENT SCREENING TOOL  Person answering questions: ____________Staff_______ _____   1.  Is the patient or any family member in the home showing any signs or symptoms regarding respiratory infection?               Person with Symptom- __________NA_________________  a. Fever                                                                          Yes___ No___          ___________________  b. Shortness of breath                                                    Yes___ No___          ___________________ c. Cough/congestion                                       Yes___  No___         ___________________ d. Body aches/pains                                                         Yes___ No___        ____________________ e. Gastrointestinal symptoms (diarrhea, nausea)  Yes___ No___         ____________________  2. Within the past 14 days, has anyone living in the home had any contact with someone with or under investigation for COVID-19?    Yes___ No_X_   Person __________________

## 2020-05-29 DIAGNOSIS — Z03818 Encounter for observation for suspected exposure to other biological agents ruled out: Secondary | ICD-10-CM | POA: Diagnosis not present

## 2020-05-30 DIAGNOSIS — S81812A Laceration without foreign body, left lower leg, initial encounter: Secondary | ICD-10-CM | POA: Diagnosis not present

## 2020-06-06 DIAGNOSIS — W1789XA Other fall from one level to another, initial encounter: Secondary | ICD-10-CM | POA: Diagnosis not present

## 2020-06-06 DIAGNOSIS — S81812A Laceration without foreign body, left lower leg, initial encounter: Secondary | ICD-10-CM | POA: Diagnosis not present

## 2020-06-06 DIAGNOSIS — I10 Essential (primary) hypertension: Secondary | ICD-10-CM | POA: Diagnosis not present

## 2020-06-12 DIAGNOSIS — M6281 Muscle weakness (generalized): Secondary | ICD-10-CM | POA: Diagnosis not present

## 2020-06-12 DIAGNOSIS — R2681 Unsteadiness on feet: Secondary | ICD-10-CM | POA: Diagnosis not present

## 2020-06-12 DIAGNOSIS — R1311 Dysphagia, oral phase: Secondary | ICD-10-CM | POA: Diagnosis not present

## 2020-06-13 DIAGNOSIS — B351 Tinea unguium: Secondary | ICD-10-CM | POA: Diagnosis not present

## 2020-06-13 DIAGNOSIS — R600 Localized edema: Secondary | ICD-10-CM | POA: Diagnosis not present

## 2020-06-13 DIAGNOSIS — L853 Xerosis cutis: Secondary | ICD-10-CM | POA: Diagnosis not present

## 2020-06-13 DIAGNOSIS — I739 Peripheral vascular disease, unspecified: Secondary | ICD-10-CM | POA: Diagnosis not present

## 2020-06-14 DIAGNOSIS — R2681 Unsteadiness on feet: Secondary | ICD-10-CM | POA: Diagnosis not present

## 2020-06-14 DIAGNOSIS — R1311 Dysphagia, oral phase: Secondary | ICD-10-CM | POA: Diagnosis not present

## 2020-06-14 DIAGNOSIS — M6281 Muscle weakness (generalized): Secondary | ICD-10-CM | POA: Diagnosis not present

## 2020-06-16 DIAGNOSIS — R1311 Dysphagia, oral phase: Secondary | ICD-10-CM | POA: Diagnosis not present

## 2020-06-16 DIAGNOSIS — R2681 Unsteadiness on feet: Secondary | ICD-10-CM | POA: Diagnosis not present

## 2020-06-16 DIAGNOSIS — M6281 Muscle weakness (generalized): Secondary | ICD-10-CM | POA: Diagnosis not present

## 2020-06-17 DIAGNOSIS — M6281 Muscle weakness (generalized): Secondary | ICD-10-CM | POA: Diagnosis not present

## 2020-06-17 DIAGNOSIS — R2681 Unsteadiness on feet: Secondary | ICD-10-CM | POA: Diagnosis not present

## 2020-06-17 DIAGNOSIS — R1311 Dysphagia, oral phase: Secondary | ICD-10-CM | POA: Diagnosis not present

## 2020-06-18 DIAGNOSIS — M6281 Muscle weakness (generalized): Secondary | ICD-10-CM | POA: Diagnosis not present

## 2020-06-18 DIAGNOSIS — R2681 Unsteadiness on feet: Secondary | ICD-10-CM | POA: Diagnosis not present

## 2020-06-18 DIAGNOSIS — R1311 Dysphagia, oral phase: Secondary | ICD-10-CM | POA: Diagnosis not present

## 2020-06-19 DIAGNOSIS — R2681 Unsteadiness on feet: Secondary | ICD-10-CM | POA: Diagnosis not present

## 2020-06-19 DIAGNOSIS — M6281 Muscle weakness (generalized): Secondary | ICD-10-CM | POA: Diagnosis not present

## 2020-06-19 DIAGNOSIS — R1311 Dysphagia, oral phase: Secondary | ICD-10-CM | POA: Diagnosis not present

## 2020-06-20 DIAGNOSIS — R2681 Unsteadiness on feet: Secondary | ICD-10-CM | POA: Diagnosis not present

## 2020-06-20 DIAGNOSIS — M6281 Muscle weakness (generalized): Secondary | ICD-10-CM | POA: Diagnosis not present

## 2020-06-20 DIAGNOSIS — R1311 Dysphagia, oral phase: Secondary | ICD-10-CM | POA: Diagnosis not present

## 2020-06-21 DIAGNOSIS — R1311 Dysphagia, oral phase: Secondary | ICD-10-CM | POA: Diagnosis not present

## 2020-06-21 DIAGNOSIS — R2681 Unsteadiness on feet: Secondary | ICD-10-CM | POA: Diagnosis not present

## 2020-06-21 DIAGNOSIS — M6281 Muscle weakness (generalized): Secondary | ICD-10-CM | POA: Diagnosis not present

## 2020-06-22 DIAGNOSIS — M6281 Muscle weakness (generalized): Secondary | ICD-10-CM | POA: Diagnosis not present

## 2020-06-22 DIAGNOSIS — R1311 Dysphagia, oral phase: Secondary | ICD-10-CM | POA: Diagnosis not present

## 2020-06-22 DIAGNOSIS — R2681 Unsteadiness on feet: Secondary | ICD-10-CM | POA: Diagnosis not present

## 2020-06-22 DIAGNOSIS — Z03818 Encounter for observation for suspected exposure to other biological agents ruled out: Secondary | ICD-10-CM | POA: Diagnosis not present

## 2020-06-23 DIAGNOSIS — M6281 Muscle weakness (generalized): Secondary | ICD-10-CM | POA: Diagnosis not present

## 2020-06-23 DIAGNOSIS — R2681 Unsteadiness on feet: Secondary | ICD-10-CM | POA: Diagnosis not present

## 2020-06-23 DIAGNOSIS — R1311 Dysphagia, oral phase: Secondary | ICD-10-CM | POA: Diagnosis not present

## 2020-06-24 DIAGNOSIS — M6281 Muscle weakness (generalized): Secondary | ICD-10-CM | POA: Diagnosis not present

## 2020-06-24 DIAGNOSIS — R1311 Dysphagia, oral phase: Secondary | ICD-10-CM | POA: Diagnosis not present

## 2020-06-24 DIAGNOSIS — R2681 Unsteadiness on feet: Secondary | ICD-10-CM | POA: Diagnosis not present

## 2020-06-25 DIAGNOSIS — R1311 Dysphagia, oral phase: Secondary | ICD-10-CM | POA: Diagnosis not present

## 2020-06-25 DIAGNOSIS — M6281 Muscle weakness (generalized): Secondary | ICD-10-CM | POA: Diagnosis not present

## 2020-06-25 DIAGNOSIS — R2681 Unsteadiness on feet: Secondary | ICD-10-CM | POA: Diagnosis not present

## 2020-06-26 DIAGNOSIS — R1311 Dysphagia, oral phase: Secondary | ICD-10-CM | POA: Diagnosis not present

## 2020-06-26 DIAGNOSIS — M6281 Muscle weakness (generalized): Secondary | ICD-10-CM | POA: Diagnosis not present

## 2020-06-26 DIAGNOSIS — R2681 Unsteadiness on feet: Secondary | ICD-10-CM | POA: Diagnosis not present

## 2020-06-27 DIAGNOSIS — R2681 Unsteadiness on feet: Secondary | ICD-10-CM | POA: Diagnosis not present

## 2020-06-27 DIAGNOSIS — M6281 Muscle weakness (generalized): Secondary | ICD-10-CM | POA: Diagnosis not present

## 2020-06-27 DIAGNOSIS — R1311 Dysphagia, oral phase: Secondary | ICD-10-CM | POA: Diagnosis not present

## 2020-06-28 DIAGNOSIS — Z03818 Encounter for observation for suspected exposure to other biological agents ruled out: Secondary | ICD-10-CM | POA: Diagnosis not present

## 2020-06-28 DIAGNOSIS — R1311 Dysphagia, oral phase: Secondary | ICD-10-CM | POA: Diagnosis not present

## 2020-06-28 DIAGNOSIS — R2681 Unsteadiness on feet: Secondary | ICD-10-CM | POA: Diagnosis not present

## 2020-06-28 DIAGNOSIS — M6281 Muscle weakness (generalized): Secondary | ICD-10-CM | POA: Diagnosis not present

## 2020-06-29 DIAGNOSIS — R2681 Unsteadiness on feet: Secondary | ICD-10-CM | POA: Diagnosis not present

## 2020-06-29 DIAGNOSIS — R1311 Dysphagia, oral phase: Secondary | ICD-10-CM | POA: Diagnosis not present

## 2020-06-29 DIAGNOSIS — M6281 Muscle weakness (generalized): Secondary | ICD-10-CM | POA: Diagnosis not present

## 2020-06-30 DIAGNOSIS — R1311 Dysphagia, oral phase: Secondary | ICD-10-CM | POA: Diagnosis not present

## 2020-06-30 DIAGNOSIS — M6281 Muscle weakness (generalized): Secondary | ICD-10-CM | POA: Diagnosis not present

## 2020-06-30 DIAGNOSIS — R2681 Unsteadiness on feet: Secondary | ICD-10-CM | POA: Diagnosis not present

## 2020-07-03 DIAGNOSIS — R2681 Unsteadiness on feet: Secondary | ICD-10-CM | POA: Diagnosis not present

## 2020-07-03 DIAGNOSIS — M6281 Muscle weakness (generalized): Secondary | ICD-10-CM | POA: Diagnosis not present

## 2020-07-03 DIAGNOSIS — R1311 Dysphagia, oral phase: Secondary | ICD-10-CM | POA: Diagnosis not present

## 2020-07-04 DIAGNOSIS — M6281 Muscle weakness (generalized): Secondary | ICD-10-CM | POA: Diagnosis not present

## 2020-07-04 DIAGNOSIS — R1311 Dysphagia, oral phase: Secondary | ICD-10-CM | POA: Diagnosis not present

## 2020-07-04 DIAGNOSIS — R2681 Unsteadiness on feet: Secondary | ICD-10-CM | POA: Diagnosis not present

## 2020-07-05 DIAGNOSIS — R1311 Dysphagia, oral phase: Secondary | ICD-10-CM | POA: Diagnosis not present

## 2020-07-05 DIAGNOSIS — M6281 Muscle weakness (generalized): Secondary | ICD-10-CM | POA: Diagnosis not present

## 2020-07-05 DIAGNOSIS — R2681 Unsteadiness on feet: Secondary | ICD-10-CM | POA: Diagnosis not present

## 2020-07-05 DIAGNOSIS — Z03818 Encounter for observation for suspected exposure to other biological agents ruled out: Secondary | ICD-10-CM | POA: Diagnosis not present

## 2020-07-06 DIAGNOSIS — M6281 Muscle weakness (generalized): Secondary | ICD-10-CM | POA: Diagnosis not present

## 2020-07-06 DIAGNOSIS — R2681 Unsteadiness on feet: Secondary | ICD-10-CM | POA: Diagnosis not present

## 2020-07-06 DIAGNOSIS — R1311 Dysphagia, oral phase: Secondary | ICD-10-CM | POA: Diagnosis not present

## 2020-07-07 DIAGNOSIS — R2681 Unsteadiness on feet: Secondary | ICD-10-CM | POA: Diagnosis not present

## 2020-07-07 DIAGNOSIS — M6281 Muscle weakness (generalized): Secondary | ICD-10-CM | POA: Diagnosis not present

## 2020-07-07 DIAGNOSIS — R1311 Dysphagia, oral phase: Secondary | ICD-10-CM | POA: Diagnosis not present

## 2020-07-08 DIAGNOSIS — M6281 Muscle weakness (generalized): Secondary | ICD-10-CM | POA: Diagnosis not present

## 2020-07-08 DIAGNOSIS — R1311 Dysphagia, oral phase: Secondary | ICD-10-CM | POA: Diagnosis not present

## 2020-07-08 DIAGNOSIS — R2681 Unsteadiness on feet: Secondary | ICD-10-CM | POA: Diagnosis not present

## 2020-07-11 DIAGNOSIS — R2681 Unsteadiness on feet: Secondary | ICD-10-CM | POA: Diagnosis not present

## 2020-07-11 DIAGNOSIS — M6281 Muscle weakness (generalized): Secondary | ICD-10-CM | POA: Diagnosis not present

## 2020-07-11 DIAGNOSIS — R1311 Dysphagia, oral phase: Secondary | ICD-10-CM | POA: Diagnosis not present

## 2020-07-12 DIAGNOSIS — Z03818 Encounter for observation for suspected exposure to other biological agents ruled out: Secondary | ICD-10-CM | POA: Diagnosis not present

## 2020-07-12 DIAGNOSIS — M6281 Muscle weakness (generalized): Secondary | ICD-10-CM | POA: Diagnosis not present

## 2020-07-12 DIAGNOSIS — R1311 Dysphagia, oral phase: Secondary | ICD-10-CM | POA: Diagnosis not present

## 2020-07-12 DIAGNOSIS — R2681 Unsteadiness on feet: Secondary | ICD-10-CM | POA: Diagnosis not present

## 2020-07-13 DIAGNOSIS — R2681 Unsteadiness on feet: Secondary | ICD-10-CM | POA: Diagnosis not present

## 2020-07-13 DIAGNOSIS — M6281 Muscle weakness (generalized): Secondary | ICD-10-CM | POA: Diagnosis not present

## 2020-07-13 DIAGNOSIS — R1311 Dysphagia, oral phase: Secondary | ICD-10-CM | POA: Diagnosis not present

## 2020-07-14 DIAGNOSIS — R1311 Dysphagia, oral phase: Secondary | ICD-10-CM | POA: Diagnosis not present

## 2020-07-14 DIAGNOSIS — R2681 Unsteadiness on feet: Secondary | ICD-10-CM | POA: Diagnosis not present

## 2020-07-14 DIAGNOSIS — M6281 Muscle weakness (generalized): Secondary | ICD-10-CM | POA: Diagnosis not present

## 2020-07-15 DIAGNOSIS — R1311 Dysphagia, oral phase: Secondary | ICD-10-CM | POA: Diagnosis not present

## 2020-07-15 DIAGNOSIS — M6281 Muscle weakness (generalized): Secondary | ICD-10-CM | POA: Diagnosis not present

## 2020-07-15 DIAGNOSIS — R2681 Unsteadiness on feet: Secondary | ICD-10-CM | POA: Diagnosis not present

## 2020-07-17 DIAGNOSIS — R2681 Unsteadiness on feet: Secondary | ICD-10-CM | POA: Diagnosis not present

## 2020-07-17 DIAGNOSIS — R1311 Dysphagia, oral phase: Secondary | ICD-10-CM | POA: Diagnosis not present

## 2020-07-17 DIAGNOSIS — M6281 Muscle weakness (generalized): Secondary | ICD-10-CM | POA: Diagnosis not present

## 2020-07-18 DIAGNOSIS — R1311 Dysphagia, oral phase: Secondary | ICD-10-CM | POA: Diagnosis not present

## 2020-07-18 DIAGNOSIS — M6281 Muscle weakness (generalized): Secondary | ICD-10-CM | POA: Diagnosis not present

## 2020-07-18 DIAGNOSIS — R2681 Unsteadiness on feet: Secondary | ICD-10-CM | POA: Diagnosis not present

## 2020-07-19 DIAGNOSIS — R1311 Dysphagia, oral phase: Secondary | ICD-10-CM | POA: Diagnosis not present

## 2020-07-19 DIAGNOSIS — R2681 Unsteadiness on feet: Secondary | ICD-10-CM | POA: Diagnosis not present

## 2020-07-19 DIAGNOSIS — Z03818 Encounter for observation for suspected exposure to other biological agents ruled out: Secondary | ICD-10-CM | POA: Diagnosis not present

## 2020-07-19 DIAGNOSIS — M6281 Muscle weakness (generalized): Secondary | ICD-10-CM | POA: Diagnosis not present

## 2020-07-20 DIAGNOSIS — R1311 Dysphagia, oral phase: Secondary | ICD-10-CM | POA: Diagnosis not present

## 2020-07-20 DIAGNOSIS — M6281 Muscle weakness (generalized): Secondary | ICD-10-CM | POA: Diagnosis not present

## 2020-07-20 DIAGNOSIS — R2681 Unsteadiness on feet: Secondary | ICD-10-CM | POA: Diagnosis not present

## 2020-07-21 DIAGNOSIS — M6281 Muscle weakness (generalized): Secondary | ICD-10-CM | POA: Diagnosis not present

## 2020-07-21 DIAGNOSIS — R2681 Unsteadiness on feet: Secondary | ICD-10-CM | POA: Diagnosis not present

## 2020-07-21 DIAGNOSIS — R1311 Dysphagia, oral phase: Secondary | ICD-10-CM | POA: Diagnosis not present

## 2020-07-24 DIAGNOSIS — M6281 Muscle weakness (generalized): Secondary | ICD-10-CM | POA: Diagnosis not present

## 2020-07-24 DIAGNOSIS — R1311 Dysphagia, oral phase: Secondary | ICD-10-CM | POA: Diagnosis not present

## 2020-07-24 DIAGNOSIS — R2681 Unsteadiness on feet: Secondary | ICD-10-CM | POA: Diagnosis not present

## 2020-07-25 DIAGNOSIS — R2681 Unsteadiness on feet: Secondary | ICD-10-CM | POA: Diagnosis not present

## 2020-07-25 DIAGNOSIS — R1311 Dysphagia, oral phase: Secondary | ICD-10-CM | POA: Diagnosis not present

## 2020-07-25 DIAGNOSIS — M6281 Muscle weakness (generalized): Secondary | ICD-10-CM | POA: Diagnosis not present

## 2020-07-26 DIAGNOSIS — R1311 Dysphagia, oral phase: Secondary | ICD-10-CM | POA: Diagnosis not present

## 2020-07-26 DIAGNOSIS — R2681 Unsteadiness on feet: Secondary | ICD-10-CM | POA: Diagnosis not present

## 2020-07-26 DIAGNOSIS — Z03818 Encounter for observation for suspected exposure to other biological agents ruled out: Secondary | ICD-10-CM | POA: Diagnosis not present

## 2020-07-26 DIAGNOSIS — M6281 Muscle weakness (generalized): Secondary | ICD-10-CM | POA: Diagnosis not present

## 2020-07-28 DIAGNOSIS — R1311 Dysphagia, oral phase: Secondary | ICD-10-CM | POA: Diagnosis not present

## 2020-07-28 DIAGNOSIS — R2681 Unsteadiness on feet: Secondary | ICD-10-CM | POA: Diagnosis not present

## 2020-07-28 DIAGNOSIS — M6281 Muscle weakness (generalized): Secondary | ICD-10-CM | POA: Diagnosis not present

## 2020-07-30 DIAGNOSIS — M6281 Muscle weakness (generalized): Secondary | ICD-10-CM | POA: Diagnosis not present

## 2020-07-30 DIAGNOSIS — R2681 Unsteadiness on feet: Secondary | ICD-10-CM | POA: Diagnosis not present

## 2020-07-30 DIAGNOSIS — R1311 Dysphagia, oral phase: Secondary | ICD-10-CM | POA: Diagnosis not present

## 2020-07-31 DIAGNOSIS — R2681 Unsteadiness on feet: Secondary | ICD-10-CM | POA: Diagnosis not present

## 2020-07-31 DIAGNOSIS — R1311 Dysphagia, oral phase: Secondary | ICD-10-CM | POA: Diagnosis not present

## 2020-07-31 DIAGNOSIS — M6281 Muscle weakness (generalized): Secondary | ICD-10-CM | POA: Diagnosis not present

## 2020-08-01 DIAGNOSIS — Z03818 Encounter for observation for suspected exposure to other biological agents ruled out: Secondary | ICD-10-CM | POA: Diagnosis not present

## 2020-08-01 DIAGNOSIS — M6281 Muscle weakness (generalized): Secondary | ICD-10-CM | POA: Diagnosis not present

## 2020-08-01 DIAGNOSIS — R2681 Unsteadiness on feet: Secondary | ICD-10-CM | POA: Diagnosis not present

## 2020-08-01 DIAGNOSIS — R1311 Dysphagia, oral phase: Secondary | ICD-10-CM | POA: Diagnosis not present

## 2020-08-02 DIAGNOSIS — R2681 Unsteadiness on feet: Secondary | ICD-10-CM | POA: Diagnosis not present

## 2020-08-02 DIAGNOSIS — R1311 Dysphagia, oral phase: Secondary | ICD-10-CM | POA: Diagnosis not present

## 2020-08-02 DIAGNOSIS — M6281 Muscle weakness (generalized): Secondary | ICD-10-CM | POA: Diagnosis not present

## 2020-08-03 DIAGNOSIS — R2681 Unsteadiness on feet: Secondary | ICD-10-CM | POA: Diagnosis not present

## 2020-08-03 DIAGNOSIS — R1311 Dysphagia, oral phase: Secondary | ICD-10-CM | POA: Diagnosis not present

## 2020-08-03 DIAGNOSIS — M6281 Muscle weakness (generalized): Secondary | ICD-10-CM | POA: Diagnosis not present

## 2020-08-07 DIAGNOSIS — R2681 Unsteadiness on feet: Secondary | ICD-10-CM | POA: Diagnosis not present

## 2020-08-07 DIAGNOSIS — W19XXXA Unspecified fall, initial encounter: Secondary | ICD-10-CM | POA: Diagnosis not present

## 2020-08-07 DIAGNOSIS — M6281 Muscle weakness (generalized): Secondary | ICD-10-CM | POA: Diagnosis not present

## 2020-08-07 DIAGNOSIS — Z23 Encounter for immunization: Secondary | ICD-10-CM | POA: Diagnosis not present

## 2020-08-08 DIAGNOSIS — Z03818 Encounter for observation for suspected exposure to other biological agents ruled out: Secondary | ICD-10-CM | POA: Diagnosis not present

## 2020-08-08 DIAGNOSIS — R2681 Unsteadiness on feet: Secondary | ICD-10-CM | POA: Diagnosis not present

## 2020-08-08 DIAGNOSIS — Z23 Encounter for immunization: Secondary | ICD-10-CM | POA: Diagnosis not present

## 2020-08-08 DIAGNOSIS — M6281 Muscle weakness (generalized): Secondary | ICD-10-CM | POA: Diagnosis not present

## 2020-08-08 DIAGNOSIS — W19XXXA Unspecified fall, initial encounter: Secondary | ICD-10-CM | POA: Diagnosis not present

## 2020-08-10 DIAGNOSIS — R2681 Unsteadiness on feet: Secondary | ICD-10-CM | POA: Diagnosis not present

## 2020-08-10 DIAGNOSIS — Z23 Encounter for immunization: Secondary | ICD-10-CM | POA: Diagnosis not present

## 2020-08-10 DIAGNOSIS — M6281 Muscle weakness (generalized): Secondary | ICD-10-CM | POA: Diagnosis not present

## 2020-08-10 DIAGNOSIS — W19XXXA Unspecified fall, initial encounter: Secondary | ICD-10-CM | POA: Diagnosis not present

## 2020-08-11 ENCOUNTER — Non-Acute Institutional Stay: Payer: Medicare Other | Admitting: Primary Care

## 2020-08-11 ENCOUNTER — Other Ambulatory Visit: Payer: Self-pay

## 2020-08-11 DIAGNOSIS — Z23 Encounter for immunization: Secondary | ICD-10-CM | POA: Diagnosis not present

## 2020-08-11 DIAGNOSIS — Z515 Encounter for palliative care: Secondary | ICD-10-CM

## 2020-08-11 DIAGNOSIS — I679 Cerebrovascular disease, unspecified: Secondary | ICD-10-CM | POA: Diagnosis not present

## 2020-08-11 DIAGNOSIS — R2681 Unsteadiness on feet: Secondary | ICD-10-CM | POA: Diagnosis not present

## 2020-08-11 DIAGNOSIS — M6281 Muscle weakness (generalized): Secondary | ICD-10-CM | POA: Diagnosis not present

## 2020-08-11 DIAGNOSIS — W19XXXA Unspecified fall, initial encounter: Secondary | ICD-10-CM | POA: Diagnosis not present

## 2020-08-11 NOTE — Progress Notes (Signed)
[facility] Consult Note Telephone: [phone]  Fax: [phone]  PATIENT NAME: Laurie Fuentes [address] [place] [address] [phone] (home)  DOB: May 16, 1926 MRN: [id number]  PRIMARY CARE PROVIDER:    Venia Carbon, MD,  [place] [address] [phone]  REFERRING PROVIDER:   Gennie Alma, [place],   [phone]  RESPONSIBLE PARTY:   Extended Emergency Contact Information Primary Emergency Contact: [place] Mobile Phone: [phone] Relation: Daughter Secondary Emergency Contact: MayLynnae Sandhoff Mobile Phone: [phone] Relation: Yolanda Bonine  I met face to face with patient in /facility.   ASSESSMENT AND RECOMMENDATIONS:   1. Advance Care Planning/Goals of Care: Goals include to maximize quality of life and symptom management. DNR on record  2. Symptom Management:   I met with Mrs. Bahner in her nursing home room. She endorses a fall recently which I was not able to corroborate with several staff. She is reporting some right arm pain but no loss of function. She has her right hand bandaged due to her ongoing squamous cell carcinoma wound. She's very hard of hearing and staff reports that family is looking into hearing aids. These have been discussed extensively in the past. Patient said she would like to go home. Staff indoor she has a good appetite and her weights remain stable.   3. Follow up Palliative Care Visit: Palliative care will continue to follow for goals of care clarification and symptom management. Return 8 weeks or prn.  4. Family /Caregiver/Community Supports: daughter is POA, lives in [place].  5. Cognitive / Functional decline: A and o x 2, very HOH which impacts orientation. Needs assistance with many adls, all iadls.  I spent 25 minutes providing this consultation,  from 1235 to 1300. More than  50% of the time in this consultation was spent coordinating communication.   CHIEF COMPLAINT: frequent falls  HISTORY OF PRESENT ILLNESS:  Laurie Fuentes is a 86 y.o. year old female with debility, frequent falls, dementia, hearing loss. She continues to have falls due to attempts to ambulate. We are asked to consult around chronic illness and decline of advancing disease.  Palliative Care was asked to follow this patient by consultation request of Gennie Alma, MD to help address advance care planning and goals of care. This is a follow up visit.  CODE STATUS: DNR  PPS: 40%  HOSPICE ELIGIBILITY/DIAGNOSIS: no  ROS  General: NAD ENMT: very HOH, denies dysphagia Cardiovascular: denies chest pain Pulmonary: denies  cough, denies increased SOB,  Abdomen: endorses appetite as good, endorses  occ constipation, endorses continence of bowel at times  GU: denies dysuria, endorses continence of urine at times MSK:  endorses ROM limitations, + falls reported Skin: denies rashes or wounds Neurological: endorses weakness, denies pain, denies insomnia Psych: Endorses flat mood  Physical Exam: Current and past weights:126.8 Constitutional:  NAD , sitting in w/c General :frail appearing, thin EYES: anicteric sclera,EOMI, lids intact, no discharge  ENMT: very severe loss of  hearing,oral mucous membranes moist, dentition largely  intact CV: S1S2, RRR, no LE edema Pulmonary: LCTA, no increased work of breathing, no cough, no audible wheezes, room air Abdomen: soft and non tender, no ascites GU: deferred MSK: moderate sacropenia, decreased ROM in all extremities, ambulatory with support and walker. Skin: warm and dry, skin tears, ongoing squamous cell wound. Neuro: Weakness,  cognitive impairment, grossly non focal other  than hearing loss Psych: anxious affective, A and O x 1-2    CURRENT PROBLEM LIST:  Patient Active Problem List   Diagnosis Date Noted  . Palliative care by specialist     . Goals of care, counseling/discussion   . Fall   . Subdural hematoma ([place])   . Closed displaced fracture of shaft of right clavicle   . UTI (urinary tract infection) 07/21/2019  . Rib contusion, left, subsequent encounter 08/08/2017  . Chronic atrial fibrillation ([place]) 05/28/2016  . Hypokalemia 05/28/2016  . Anticoagulant long-term use 04/02/2016  . Hip fracture requiring operative repair ([place]) 01/07/2016  . Essential hypertension 11/29/2015  . Cerebral vascular disease 11/29/2015  . GERD (gastroesophageal reflux disease) 06/14/2015  . DVT of lower extremity (deep venous thrombosis) ([place]) 06/24/2014   PAST MEDICAL HISTORY:  Past Medical History:  Diagnosis Date  . GERD (gastroesophageal reflux disease)   . Hypertension   . Hypokalemia   . Stroke [facility])     SOCIAL HX:  Social History   Tobacco Use  . Smoking status: Never Smoker  . Smokeless tobacco: Never Used  Substance Use Topics  . Alcohol use: No    Alcohol/week: 0.0 standard drinks   FAMILY HX:  Family History  Problem Relation Age of Onset  . Diabetes Sister     ALLERGIES: No Known Allergies   PERTINENT MEDICATIONS:  Outpatient Encounter Medications as of 08/11/2020  Medication Sig  . acetaminophen (TYLENOL) 500 MG tablet Take 500-1,000 mg by mouth every 6 (six) hours as needed for mild pain or fever.   Marland Kitchen amLODipine (NORVASC) 5 MG tablet Take 1 tablet (5 mg total) by mouth daily.  . ciclopirox (LOPROX) 0.77 % cream Apply 1 application topically daily.  . feeding supplement, ENSURE ENLIVE, (ENSURE ENLIVE) LIQD Take 237 mLs by mouth 2 (two) times daily between meals.  . Melatonin 3 MG TABS Take 1 tablet by mouth at bedtime.  . naproxen sodium (ANAPROX) 275 MG tablet Take 550 mg by mouth 2 (two) times daily with a meal.  . polyethylene glycol (MIRALAX / GLYCOLAX) 17 g packet Take 17 g by mouth daily.  . sennosides-docusate sodium (SENOKOT-S) 8.6-50 MG tablet Take 2 tablets by mouth daily.   No  facility-administered encounter medications on file as of 08/11/2020.     Jason Coop, NP , DNP, MPH, AGPCNP-BC, [facility]  COVID-19 PATIENT SCREENING TOOL  Person answering questions: ____________staff______ _____   1.  Is the patient or any family member in the home showing any signs or symptoms regarding respiratory infection?               Person with Symptom- __________NA_________________  a. Fever                                                                          Yes___ No___          ___________________  b. Shortness of breath  Yes___ No___          ___________________ c. Cough/congestion                                       Yes___  No___         ___________________ d. Body aches/pains                                                         Yes___ No___        ____________________ e. Gastrointestinal symptoms (diarrhea, nausea)           Yes___ No___        ____________________  2. Within the past 14 days, has anyone living in the home had any contact with someone with or under investigation for COVID-19?    Yes___ No_X_   Person __________________   

## 2020-08-14 DIAGNOSIS — Z23 Encounter for immunization: Secondary | ICD-10-CM | POA: Diagnosis not present

## 2020-08-14 DIAGNOSIS — R2681 Unsteadiness on feet: Secondary | ICD-10-CM | POA: Diagnosis not present

## 2020-08-14 DIAGNOSIS — M6281 Muscle weakness (generalized): Secondary | ICD-10-CM | POA: Diagnosis not present

## 2020-08-14 DIAGNOSIS — W19XXXA Unspecified fall, initial encounter: Secondary | ICD-10-CM | POA: Diagnosis not present

## 2020-08-15 DIAGNOSIS — Z23 Encounter for immunization: Secondary | ICD-10-CM | POA: Diagnosis not present

## 2020-08-15 DIAGNOSIS — R2681 Unsteadiness on feet: Secondary | ICD-10-CM | POA: Diagnosis not present

## 2020-08-15 DIAGNOSIS — W19XXXA Unspecified fall, initial encounter: Secondary | ICD-10-CM | POA: Diagnosis not present

## 2020-08-15 DIAGNOSIS — M6281 Muscle weakness (generalized): Secondary | ICD-10-CM | POA: Diagnosis not present

## 2020-08-16 DIAGNOSIS — Z23 Encounter for immunization: Secondary | ICD-10-CM | POA: Diagnosis not present

## 2020-08-16 DIAGNOSIS — R2681 Unsteadiness on feet: Secondary | ICD-10-CM | POA: Diagnosis not present

## 2020-08-16 DIAGNOSIS — M6281 Muscle weakness (generalized): Secondary | ICD-10-CM | POA: Diagnosis not present

## 2020-08-16 DIAGNOSIS — W19XXXA Unspecified fall, initial encounter: Secondary | ICD-10-CM | POA: Diagnosis not present

## 2020-08-17 DIAGNOSIS — R2681 Unsteadiness on feet: Secondary | ICD-10-CM | POA: Diagnosis not present

## 2020-08-17 DIAGNOSIS — M6281 Muscle weakness (generalized): Secondary | ICD-10-CM | POA: Diagnosis not present

## 2020-08-17 DIAGNOSIS — W19XXXA Unspecified fall, initial encounter: Secondary | ICD-10-CM | POA: Diagnosis not present

## 2020-08-17 DIAGNOSIS — Z23 Encounter for immunization: Secondary | ICD-10-CM | POA: Diagnosis not present

## 2020-08-18 DIAGNOSIS — M6281 Muscle weakness (generalized): Secondary | ICD-10-CM | POA: Diagnosis not present

## 2020-08-18 DIAGNOSIS — F0150 Vascular dementia without behavioral disturbance: Secondary | ICD-10-CM | POA: Diagnosis not present

## 2020-08-18 DIAGNOSIS — Z23 Encounter for immunization: Secondary | ICD-10-CM | POA: Diagnosis not present

## 2020-08-18 DIAGNOSIS — W19XXXA Unspecified fall, initial encounter: Secondary | ICD-10-CM | POA: Diagnosis not present

## 2020-08-18 DIAGNOSIS — R54 Age-related physical debility: Secondary | ICD-10-CM | POA: Diagnosis not present

## 2020-08-18 DIAGNOSIS — R296 Repeated falls: Secondary | ICD-10-CM | POA: Diagnosis not present

## 2020-08-18 DIAGNOSIS — R2681 Unsteadiness on feet: Secondary | ICD-10-CM | POA: Diagnosis not present

## 2020-08-21 DIAGNOSIS — M6281 Muscle weakness (generalized): Secondary | ICD-10-CM | POA: Diagnosis not present

## 2020-08-21 DIAGNOSIS — W19XXXA Unspecified fall, initial encounter: Secondary | ICD-10-CM | POA: Diagnosis not present

## 2020-08-21 DIAGNOSIS — Z23 Encounter for immunization: Secondary | ICD-10-CM | POA: Diagnosis not present

## 2020-08-21 DIAGNOSIS — R2681 Unsteadiness on feet: Secondary | ICD-10-CM | POA: Diagnosis not present

## 2020-08-22 DIAGNOSIS — W19XXXA Unspecified fall, initial encounter: Secondary | ICD-10-CM | POA: Diagnosis not present

## 2020-08-22 DIAGNOSIS — Z23 Encounter for immunization: Secondary | ICD-10-CM | POA: Diagnosis not present

## 2020-08-22 DIAGNOSIS — R2681 Unsteadiness on feet: Secondary | ICD-10-CM | POA: Diagnosis not present

## 2020-08-22 DIAGNOSIS — M6281 Muscle weakness (generalized): Secondary | ICD-10-CM | POA: Diagnosis not present

## 2020-08-23 DIAGNOSIS — W19XXXA Unspecified fall, initial encounter: Secondary | ICD-10-CM | POA: Diagnosis not present

## 2020-08-23 DIAGNOSIS — R2681 Unsteadiness on feet: Secondary | ICD-10-CM | POA: Diagnosis not present

## 2020-08-23 DIAGNOSIS — M6281 Muscle weakness (generalized): Secondary | ICD-10-CM | POA: Diagnosis not present

## 2020-08-23 DIAGNOSIS — Z23 Encounter for immunization: Secondary | ICD-10-CM | POA: Diagnosis not present

## 2020-08-24 DIAGNOSIS — Z23 Encounter for immunization: Secondary | ICD-10-CM | POA: Diagnosis not present

## 2020-08-24 DIAGNOSIS — R2681 Unsteadiness on feet: Secondary | ICD-10-CM | POA: Diagnosis not present

## 2020-08-24 DIAGNOSIS — M6281 Muscle weakness (generalized): Secondary | ICD-10-CM | POA: Diagnosis not present

## 2020-08-24 DIAGNOSIS — W19XXXA Unspecified fall, initial encounter: Secondary | ICD-10-CM | POA: Diagnosis not present

## 2020-08-25 DIAGNOSIS — Z23 Encounter for immunization: Secondary | ICD-10-CM | POA: Diagnosis not present

## 2020-08-25 DIAGNOSIS — W19XXXA Unspecified fall, initial encounter: Secondary | ICD-10-CM | POA: Diagnosis not present

## 2020-08-25 DIAGNOSIS — M6281 Muscle weakness (generalized): Secondary | ICD-10-CM | POA: Diagnosis not present

## 2020-08-25 DIAGNOSIS — R2681 Unsteadiness on feet: Secondary | ICD-10-CM | POA: Diagnosis not present

## 2020-08-25 DIAGNOSIS — Z79899 Other long term (current) drug therapy: Secondary | ICD-10-CM | POA: Diagnosis not present

## 2020-08-25 DIAGNOSIS — E119 Type 2 diabetes mellitus without complications: Secondary | ICD-10-CM | POA: Diagnosis not present

## 2020-08-28 DIAGNOSIS — W19XXXA Unspecified fall, initial encounter: Secondary | ICD-10-CM | POA: Diagnosis not present

## 2020-08-28 DIAGNOSIS — Z23 Encounter for immunization: Secondary | ICD-10-CM | POA: Diagnosis not present

## 2020-08-28 DIAGNOSIS — M6281 Muscle weakness (generalized): Secondary | ICD-10-CM | POA: Diagnosis not present

## 2020-08-28 DIAGNOSIS — R2681 Unsteadiness on feet: Secondary | ICD-10-CM | POA: Diagnosis not present

## 2020-08-29 DIAGNOSIS — W19XXXA Unspecified fall, initial encounter: Secondary | ICD-10-CM | POA: Diagnosis not present

## 2020-08-29 DIAGNOSIS — M6281 Muscle weakness (generalized): Secondary | ICD-10-CM | POA: Diagnosis not present

## 2020-08-29 DIAGNOSIS — Z23 Encounter for immunization: Secondary | ICD-10-CM | POA: Diagnosis not present

## 2020-08-29 DIAGNOSIS — R2681 Unsteadiness on feet: Secondary | ICD-10-CM | POA: Diagnosis not present

## 2020-08-30 DIAGNOSIS — R2681 Unsteadiness on feet: Secondary | ICD-10-CM | POA: Diagnosis not present

## 2020-08-30 DIAGNOSIS — W19XXXA Unspecified fall, initial encounter: Secondary | ICD-10-CM | POA: Diagnosis not present

## 2020-08-30 DIAGNOSIS — M6281 Muscle weakness (generalized): Secondary | ICD-10-CM | POA: Diagnosis not present

## 2020-08-30 DIAGNOSIS — Z79899 Other long term (current) drug therapy: Secondary | ICD-10-CM | POA: Diagnosis not present

## 2020-08-30 DIAGNOSIS — Z23 Encounter for immunization: Secondary | ICD-10-CM | POA: Diagnosis not present

## 2020-08-31 DIAGNOSIS — Z23 Encounter for immunization: Secondary | ICD-10-CM | POA: Diagnosis not present

## 2020-08-31 DIAGNOSIS — M6281 Muscle weakness (generalized): Secondary | ICD-10-CM | POA: Diagnosis not present

## 2020-08-31 DIAGNOSIS — W19XXXA Unspecified fall, initial encounter: Secondary | ICD-10-CM | POA: Diagnosis not present

## 2020-08-31 DIAGNOSIS — R2681 Unsteadiness on feet: Secondary | ICD-10-CM | POA: Diagnosis not present

## 2020-09-01 DIAGNOSIS — M6281 Muscle weakness (generalized): Secondary | ICD-10-CM | POA: Diagnosis not present

## 2020-09-01 DIAGNOSIS — W19XXXA Unspecified fall, initial encounter: Secondary | ICD-10-CM | POA: Diagnosis not present

## 2020-09-01 DIAGNOSIS — R2681 Unsteadiness on feet: Secondary | ICD-10-CM | POA: Diagnosis not present

## 2020-09-01 DIAGNOSIS — Z23 Encounter for immunization: Secondary | ICD-10-CM | POA: Diagnosis not present

## 2020-09-04 DIAGNOSIS — M6281 Muscle weakness (generalized): Secondary | ICD-10-CM | POA: Diagnosis not present

## 2020-09-04 DIAGNOSIS — W19XXXA Unspecified fall, initial encounter: Secondary | ICD-10-CM | POA: Diagnosis not present

## 2020-09-04 DIAGNOSIS — R2681 Unsteadiness on feet: Secondary | ICD-10-CM | POA: Diagnosis not present

## 2020-09-04 DIAGNOSIS — Z03818 Encounter for observation for suspected exposure to other biological agents ruled out: Secondary | ICD-10-CM | POA: Diagnosis not present

## 2020-09-05 DIAGNOSIS — R2681 Unsteadiness on feet: Secondary | ICD-10-CM | POA: Diagnosis not present

## 2020-09-05 DIAGNOSIS — W19XXXA Unspecified fall, initial encounter: Secondary | ICD-10-CM | POA: Diagnosis not present

## 2020-09-05 DIAGNOSIS — M6281 Muscle weakness (generalized): Secondary | ICD-10-CM | POA: Diagnosis not present

## 2020-09-07 DIAGNOSIS — Z03818 Encounter for observation for suspected exposure to other biological agents ruled out: Secondary | ICD-10-CM | POA: Diagnosis not present

## 2020-09-14 DIAGNOSIS — R54 Age-related physical debility: Secondary | ICD-10-CM | POA: Diagnosis not present

## 2020-09-14 DIAGNOSIS — F0150 Vascular dementia without behavioral disturbance: Secondary | ICD-10-CM | POA: Diagnosis not present

## 2020-09-14 DIAGNOSIS — M6281 Muscle weakness (generalized): Secondary | ICD-10-CM | POA: Diagnosis not present

## 2020-09-14 DIAGNOSIS — R296 Repeated falls: Secondary | ICD-10-CM | POA: Diagnosis not present

## 2020-09-14 DIAGNOSIS — R2681 Unsteadiness on feet: Secondary | ICD-10-CM | POA: Diagnosis not present

## 2020-09-19 DIAGNOSIS — F0150 Vascular dementia without behavioral disturbance: Secondary | ICD-10-CM | POA: Diagnosis not present

## 2020-09-19 DIAGNOSIS — R296 Repeated falls: Secondary | ICD-10-CM | POA: Diagnosis not present

## 2020-09-19 DIAGNOSIS — M6281 Muscle weakness (generalized): Secondary | ICD-10-CM | POA: Diagnosis not present

## 2020-09-19 DIAGNOSIS — R2681 Unsteadiness on feet: Secondary | ICD-10-CM | POA: Diagnosis not present

## 2020-09-19 DIAGNOSIS — R54 Age-related physical debility: Secondary | ICD-10-CM | POA: Diagnosis not present

## 2020-09-20 DIAGNOSIS — Z23 Encounter for immunization: Secondary | ICD-10-CM | POA: Diagnosis not present

## 2020-10-06 ENCOUNTER — Other Ambulatory Visit
Admission: RE | Admit: 2020-10-06 | Discharge: 2020-10-06 | Disposition: A | Discharge status: Home / Self Care | Payer: Medicare Other | Source: Ambulatory Visit | Attending: General Practice | Admitting: General Practice

## 2020-10-06 DIAGNOSIS — R82998 Other abnormal findings in urine: Secondary | ICD-10-CM | POA: Insufficient documentation

## 2020-10-06 LAB — URINALYSIS, COMPLETE (UACMP) WITH MICROSCOPIC
Bilirubin Urine: NEGATIVE
Glucose, UA: NEGATIVE mg/dL
Hgb urine dipstick: NEGATIVE
Ketones, ur: NEGATIVE mg/dL
Leukocytes,Ua: NEGATIVE
Nitrite: NEGATIVE
Protein, ur: NEGATIVE mg/dL
Specific Gravity, Urine: 1.009 (ref 1.005–1.030)
pH: 7 (ref 5.0–8.0)

## 2020-10-08 LAB — URINE CULTURE

## 2020-10-16 DIAGNOSIS — K219 Gastro-esophageal reflux disease without esophagitis: Secondary | ICD-10-CM | POA: Diagnosis not present

## 2020-10-16 DIAGNOSIS — K5900 Constipation, unspecified: Secondary | ICD-10-CM | POA: Diagnosis not present

## 2020-10-16 DIAGNOSIS — F0150 Vascular dementia without behavioral disturbance: Secondary | ICD-10-CM | POA: Diagnosis not present

## 2020-10-16 DIAGNOSIS — G4700 Insomnia, unspecified: Secondary | ICD-10-CM | POA: Diagnosis not present

## 2020-10-16 DIAGNOSIS — I10 Essential (primary) hypertension: Secondary | ICD-10-CM | POA: Diagnosis not present

## 2020-11-10 ENCOUNTER — Non-Acute Institutional Stay: Payer: Medicare Other | Admitting: Primary Care

## 2020-11-10 ENCOUNTER — Other Ambulatory Visit: Payer: Self-pay

## 2020-11-10 DIAGNOSIS — I679 Cerebrovascular disease, unspecified: Secondary | ICD-10-CM

## 2020-11-10 DIAGNOSIS — Z515 Encounter for palliative care: Secondary | ICD-10-CM

## 2020-11-10 DIAGNOSIS — I482 Chronic atrial fibrillation, unspecified: Secondary | ICD-10-CM

## 2020-11-10 DIAGNOSIS — S065X9A Traumatic subdural hemorrhage with loss of consciousness of unspecified duration, initial encounter: Secondary | ICD-10-CM

## 2020-11-10 DIAGNOSIS — S065XAA Traumatic subdural hemorrhage with loss of consciousness status unknown, initial encounter: Secondary | ICD-10-CM

## 2020-11-10 NOTE — Progress Notes (Signed)
[facility] Palliative Care Consult Note Telephone: [phone]  Fax: [phone]     Date of encounter: 11/10/20 PATIENT NAME: Laurie Fuentes [address] [place] [address] [phone] (home)  DOB: Sep 12, 1926 MRN: [id number]  PRIMARY CARE PROVIDER:    Gennie Fuentes, [place],  [place] [address] [phone]  REFERRING PROVIDER:   Gennie Fuentes, [place],  [place] [address] [phone]  RESPONSIBLE PARTY:   Extended Emergency Contact Information Primary Emergency Contact: Laurie Fuentes Mobile Phone: [phone] Relation: Daughter Secondary Emergency Contact: MayLynnae Sandhoff Mobile Phone: [phone] Relation: Yolanda Bonine  I met face to face with patient in facility. Palliative Care was asked to follow this patient by consultation request of Laurie Alma, MD  to help address advance care planning and goals of care. This is a follow up  visit.   ASSESSMENT AND RECOMMENDATIONS:   1. Advance Care Planning/Goals of Care: Goals include to maximize quality of life and symptom management. Current DNR unchanged. Call to Laurie Fuentes to discuss case. No new concerns. She herself has been ill with a cold for over 10 days and has not been up to visit due to active symptoms.  2. Symptom Management:   Sensory isolation: Hearing aid went missing. Discussed with Laurie Fuentes, daughter, to pursue putting in a claim. Patient would benefit from improved sensory input.  Nutrition: Patient appears to be gaining weight although she does keep an amount of LE edema.   3. Follow up Palliative Care Visit: Palliative care will continue to follow for goals of care clarification and symptom management. Return 12 weeks or prn.  4. Family /Caregiver/Community Supports: Daughter is Designer, industrial/product, lives in [place].  5. Cognitive / Functional decline: A and O x 2,did subscribe to loss of hearing aid,  dependent in  all iadls, most adls.  I spent 25 minutes providing this consultation,  from 1000 to 1025. More than 50% of the time in this consultation was spent coordinating communication.   CODE STATUS:DNR  PPS: 40%  HOSPICE ELIGIBILITY/DIAGNOSIS: no  Subjective:  CHIEF COMPLAINT: debility  HISTORY OF PRESENT ILLNESS:  Laurie Fuentes is a 87 y.o. year old female  with debility, hearing loss, skin tears, fall risk. We are asked to consult around advance care planning and complex medical decision making.    History obtained from review of EMR, discussion with primary team, and  interview with family, caregiver  and/or Laurie Fuentes. Records reviewed and summarized above.   CURRENT PROBLEM LIST:  Patient Active Problem List   Diagnosis Date Noted  . Palliative care by specialist   . Goals of care, counseling/discussion   . Fall   . Subdural hematoma ([place])   . Closed displaced fracture of shaft of right clavicle   . UTI (urinary tract infection) 07/21/2019  . Rib contusion, left, subsequent encounter 08/08/2017  . Chronic atrial fibrillation (Laurie Fuentes) 05/28/2016  . Hypokalemia 05/28/2016  . Anticoagulant long-term use 04/02/2016  . Hip fracture requiring operative repair ([place]) 01/07/2016  . Essential hypertension 11/29/2015  . Cerebral vascular disease 11/29/2015  . GERD (gastroesophageal reflux disease) 06/14/2015  . DVT of lower extremity (deep venous thrombosis) ([place]) 06/24/2014   PAST MEDICAL HISTORY:  Active Ambulatory Problems    Diagnosis Date Noted  . DVT of lower extremity (deep venous thrombosis) ([place]) 06/24/2014  . GERD (gastroesophageal reflux disease) 06/14/2015  . Essential hypertension 11/29/2015  . Cerebral vascular disease 11/29/2015  .  Anticoagulant long-term use 04/02/2016  . Chronic atrial fibrillation ([place]) 05/28/2016  . Hypokalemia 05/28/2016  . Rib contusion, left, subsequent encounter 08/08/2017  . Hip fracture requiring operative repair ([place]) 01/07/2016  . UTI  (urinary tract infection) 07/21/2019  . Palliative care by specialist   . Goals of care, counseling/discussion   . Fall   . Subdural hematoma ([place])   . Closed displaced fracture of shaft of right clavicle    Resolved Ambulatory Problems    Diagnosis Date Noted  . No Resolved Ambulatory Problems   Past Medical History:  Diagnosis Date  . Hypertension   . Stroke [facility])    SOCIAL HX:  Social History   Tobacco Use  . Smoking status: Never Smoker  . Smokeless tobacco: Never Used  Substance Use Topics  . Alcohol use: No    Alcohol/week: 0.0 standard drinks   FAMILY HX:  Family History  Problem Relation Age of Onset  . Diabetes Sister       ALLERGIES: No Known Allergies   PERTINENT MEDICATIONS:  Outpatient Encounter Medications as of 11/10/2020  Medication Sig  . acetaminophen (TYLENOL) 500 MG tablet Take 500-1,000 mg by mouth every 6 (six) hours as needed for mild pain or fever.   Marland Kitchen amLODipine (NORVASC) 5 MG tablet Take 1 tablet (5 mg total) by mouth daily.  . ciclopirox (LOPROX) 0.77 % cream Apply 1 application topically daily.  . feeding supplement, ENSURE ENLIVE, (ENSURE ENLIVE) LIQD Take 237 mLs by mouth 2 (two) times daily between meals.  . Melatonin 3 MG TABS Take 1 tablet by mouth at bedtime.  . naproxen sodium (ANAPROX) 275 MG tablet Take 550 mg by mouth 2 (two) times daily with a meal.  . polyethylene glycol (MIRALAX / GLYCOLAX) 17 g packet Take 17 g by mouth daily.  . sennosides-docusate sodium (SENOKOT-S) 8.6-50 MG tablet Take 2 tablets by mouth daily.   No facility-administered encounter medications on file as of 11/10/2020.    Objective: ROS Laurie Fuentes   General: NAD EYES: denies vision changes ENMT: denies dysphagia, HOH Cardiovascular: denies chest pain Pulmonary: denies cough, denies increased SOB Abdomen: endorses good appetite, denies constipation, endorses incontinence of bowel GU: denies dysuria, endorses incontinence of urine MSK:  endorses ROM  limitations, no falls reported, fall risk Skin: fraible with tears, wounds on R FA, LLE, sacrum per facility report Neurological: endorses weakness, endorse occ  pain, denies insomnia Psych: Endorses positive mood Heme/lymph/immuno: denies bruises, abnormal bleeding  Physical Exam: Current and past weights: 132 lb  Constitutional: NAD General: frail appearing, thin EYES: anicteric sclera,lids intact, no discharge  ENMT: intact hearing,oral mucous membranes moist, hearing aid lost CV: S1S2, RRR, L LE edema 3+, R 2+ Pulmonary: LCTA, no increased work of breathing, no cough, no audible wheezes, room air Abdomen: intake 76-100%, normo-active BS +  4 quadrants, soft and non tender, no ascites GU: deferred MSK: severe sarcopenia, decreased ROM in all extremities, no contractures of LE, non ambulatory but attempts Skin:friable, dressing on R FA Neuro: Generalized weakness, severe cognitive impairment Psych: non-anxious affect, A and O x 1-2 Hem/lymph/immuno: no widespread bruising   Thank you for the opportunity to participate in the care of Laurie Fuentes.  The palliative care team will continue to follow. Please call our office at [phone] if we can be of additional assistance.  Jason Coop, NP , DNP, MPH, AGPCNP-BC, ACHPN  COVID-19 PATIENT SCREENING TOOL  Person answering questions: ____________staff______ _____   1.  Is the patient or any  family member in the home showing any signs or symptoms regarding respiratory infection?               Person with Symptom- __________NA_________________  a. Fever                                                                          Yes___ No___          ___________________  b. Shortness of breath                                                    Yes___ No___          ___________________ c. Cough/congestion                                       Yes___  No___         ___________________ d. Body aches/pains                                                          Yes___ No___        ____________________ e. Gastrointestinal symptoms (diarrhea, nausea)           Yes___ No___        ____________________  2. Within the past 14 days, has anyone living in the home had any contact with someone with or under investigation for COVID-19?    Yes___ No_X_   Person __________________

## 2021-02-11 ENCOUNTER — Encounter: Payer: Self-pay | Admitting: Radiology

## 2021-02-11 ENCOUNTER — Emergency Department: Payer: Medicare Other

## 2021-02-11 ENCOUNTER — Emergency Department
Admission: EM | Admit: 2021-02-11 | Discharge: 2021-02-11 | Disposition: A | Discharge status: Home / Self Care | Payer: Medicare Other | Attending: Emergency Medicine | Admitting: Emergency Medicine

## 2021-02-11 DIAGNOSIS — S728X1A Other fracture of right femur, initial encounter for closed fracture: Secondary | ICD-10-CM | POA: Insufficient documentation

## 2021-02-11 DIAGNOSIS — Z79899 Other long term (current) drug therapy: Secondary | ICD-10-CM | POA: Diagnosis not present

## 2021-02-11 DIAGNOSIS — X58XXXA Exposure to other specified factors, initial encounter: Secondary | ICD-10-CM | POA: Diagnosis not present

## 2021-02-11 DIAGNOSIS — F0390 Unspecified dementia without behavioral disturbance: Secondary | ICD-10-CM | POA: Diagnosis not present

## 2021-02-11 DIAGNOSIS — S79911A Unspecified injury of right hip, initial encounter: Secondary | ICD-10-CM | POA: Diagnosis present

## 2021-02-11 DIAGNOSIS — Z7901 Long term (current) use of anticoagulants: Secondary | ICD-10-CM | POA: Insufficient documentation

## 2021-02-11 DIAGNOSIS — I10 Essential (primary) hypertension: Secondary | ICD-10-CM | POA: Insufficient documentation

## 2021-02-11 LAB — COMPREHENSIVE METABOLIC PANEL
ALT: 8 U/L (ref 0–44)
AST: 14 U/L — ABNORMAL LOW (ref 15–41)
Albumin: 3.1 g/dL — ABNORMAL LOW (ref 3.5–5.0)
Alkaline Phosphatase: 94 U/L (ref 38–126)
Anion gap: 6 (ref 5–15)
BUN: 22 mg/dL (ref 8–23)
CO2: 27 mmol/L (ref 22–32)
Calcium: 8.4 mg/dL — ABNORMAL LOW (ref 8.9–10.3)
Chloride: 102 mmol/L (ref 98–111)
Creatinine, Ser: 0.56 mg/dL (ref 0.44–1.00)
GFR, Estimated: >60 mL/min (ref >60)
Glucose, Bld: 101 mg/dL — ABNORMAL HIGH (ref 70–99)
Potassium: 4 mmol/L (ref 3.5–5.1)
Sodium: 135 mmol/L (ref 135–145)
Total Bilirubin: 0.5 mg/dL (ref 0.3–1.2)
Total Protein: 5.8 g/dL — ABNORMAL LOW (ref 6.5–8.1)

## 2021-02-11 LAB — CBC
HCT: 28.6 % — ABNORMAL LOW (ref 36.0–46.0)
Hemoglobin: 9.2 g/dL — ABNORMAL LOW (ref 12.0–15.0)
MCH: 25.4 pg — ABNORMAL LOW (ref 26.0–34.0)
MCHC: 32.2 g/dL (ref 30.0–36.0)
MCV: 79 fL — ABNORMAL LOW (ref 80.0–100.0)
Platelets: 211 10*3/uL (ref 150–400)
RBC: 3.62 MIL/uL — ABNORMAL LOW (ref 3.87–5.11)
RDW: 15.9 % — ABNORMAL HIGH (ref 11.5–15.5)
WBC: 5.3 10*3/uL (ref 4.0–10.5)
nRBC: 0 % (ref 0.0–0.2)

## 2021-02-11 LAB — APTT: aPTT: 31 seconds (ref 24–36)

## 2021-02-11 LAB — PROTIME-INR
INR: 1.2 (ref 0.8–1.2)
Prothrombin Time: 14.5 seconds (ref 11.4–15.2)

## 2021-02-11 NOTE — ED Provider Notes (Signed)
[facility] Emergency Department Provider Note   ____________________________________________    I have reviewed the triage vital signs and the nursing notes.   HISTORY  Chief Complaint Hip Pain  History limited by dementia   HPI Laurie Fuentes is a 87 y.o. female who was sent from [facility] for refusal to bear weight on the right leg, apparently had a stat x-ray there which demonstrated a femur fracture.  Patient is unable to to provide additional history, she appears comfortable.  Past Medical History:  Diagnosis Date  . GERD (gastroesophageal reflux disease)   . Hypertension   . Hypokalemia   . Stroke [facility])     Patient Active Problem List   Diagnosis Date Noted  . Palliative care by specialist   . Goals of care, counseling/discussion   . Fall   . Subdural hematoma ([place])   . Closed displaced fracture of shaft of right clavicle   . UTI (urinary tract infection) 07/21/2019  . Rib contusion, left, subsequent encounter 08/08/2017  . Chronic atrial fibrillation ([place]) 05/28/2016  . Hypokalemia 05/28/2016  . Anticoagulant long-term use 04/02/2016  . Hip fracture requiring operative repair ([place]) 01/07/2016  . Essential hypertension 11/29/2015  . Cerebral vascular disease 11/29/2015  . GERD (gastroesophageal reflux disease) 06/14/2015  . DVT of lower extremity (deep venous thrombosis) ([place]) 06/24/2014    Past Surgical History:  Procedure Laterality Date  . GALLBLADDER SURGERY    . HIP FRACTURE SURGERY Right     Prior to Admission medications   Medication Sig Start Date End Date Taking? Authorizing Provider  acetaminophen (TYLENOL) 500 MG tablet Take 500-1,000 mg by mouth every 6 (six) hours as needed for mild pain or fever.     [provider]  amLODipine (NORVASC) 5 MG tablet Take 1 tablet (5 mg total) by mouth daily. 07/27/19   Dustin Flock, MD  feeding supplement, ENSURE ENLIVE, (ENSURE ENLIVE) LIQD Take 237 mLs by  mouth 2 (two) times daily between meals. 07/27/19   Dustin Flock, MD  Melatonin 3 MG TABS Take 1 tablet by mouth at bedtime.    [provider]  naproxen sodium (ANAPROX) 275 MG tablet Take 550 mg by mouth 2 (two) times daily with a meal.    [provider]  polyethylene glycol (MIRALAX / GLYCOLAX) 17 g packet Take 17 g by mouth daily.    [provider]  sennosides-docusate sodium (SENOKOT-S) 8.6-50 MG tablet Take 2 tablets by mouth daily.    [provider]     Allergies Patient has no known allergies.  Family History  Problem Relation Age of Onset  . Diabetes Sister     Social History Social History   Tobacco Use  . Smoking status: Never Smoker  . Smokeless tobacco: Never Used  Substance Use Topics  . Alcohol use: No    Alcohol/week: 0.0 standard drinks  . Drug use: No    Review of Systems limited by dementia     ____________________________________________   PHYSICAL EXAM:  VITAL SIGNS: ED Triage Vitals  Enc Vitals Group     BP 02/11/21 1912 (!) 149/61     Pulse Rate 02/11/21 1912 69     Resp 02/11/21 1912 18     Temp 02/11/21 1912 98.4 F (36.9 C)     Temp Source 02/11/21 1912 Oral     SpO2 02/11/21 1911 96 %     Weight --      Height --  Head Circumference --      Peak Flow --      Pain Score --      Pain Loc --      Pain Edu? --      Excl. in [place]? --     Constitutional: Alert, no acute distress Eyes: Conjunctivae are normal.  Head: Atraumatic. Nose: No congestion/rhinnorhea. Mouth/Throat: Mucous membranes are moist.   Neck:  Painless ROM Cardiovascular: Normal rate, regular rhythm.  Good peripheral circulation. Respiratory: Normal respiratory effort.  No retractions. Lungs CTAB. Gastrointestinal: Soft and nontender. No distention.  No CVA tenderness.  Musculoskeletal: Right lower leg: Held slightly abducted, no significant shortening, warm and well perfused Neurologic:  No gross focal neurologic  deficits are appreciated.  Skin:  Skin is warm, dry and intact. No rash noted. Psychiatric: Mood and affect are normal. Speech and behavior are normal.  ____________________________________________   LABS (all labs ordered are listed, but only abnormal results are displayed)  Labs Reviewed  CBC - Abnormal; Notable for the following components:      Result Value   RBC 3.62 (*)    Hemoglobin 9.2 (*)    HCT 28.6 (*)    MCV 79.0 (*)    MCH 25.4 (*)    RDW 15.9 (*)    All other components within normal limits  COMPREHENSIVE METABOLIC PANEL - Abnormal; Notable for the following components:   Glucose, Bld 101 (*)    Calcium 8.4 (*)    Total Protein 5.8 (*)    Albumin 3.1 (*)    AST 14 (*)    All other components within normal limits  APTT  PROTIME-INR   ____________________________________________  EKG   ____________________________________________  RADIOLOGY  Pelvis and femur x-ray ____________________________________________   PROCEDURES  Procedure(s) performed: No  Procedures   Critical Care performed: No ____________________________________________   INITIAL IMPRESSION / ASSESSMENT AND PLAN / ED COURSE  Pertinent labs & imaging results that were available during my care of the patient were reviewed by me and considered in my medical decision making (see chart for details).  Patient sent in for possible femur injury, will repeat x-rays, she does not appear to be any pain at this time.  Will obtain IV, labs and reevaluate  X-ray demonstrates spiral fracture between 2 screws in the femur fixation hardware  Discussed with Dr. Harlow Mares who studied the x-ray and the notes no indication for admission as the patient is in a nursing home, patient should only bear weight on the left leg, and follow-up with him as an outpatient    ____________________________________________   FINAL CLINICAL IMPRESSION(S) / ED DIAGNOSES  Final diagnoses:  Other closed fracture  of right femur, unspecified portion of femur, initial encounter [facility])        Note:  This document was prepared using Dragon voice recognition software and may include unintentional dictation errors.   [place] Drafts, MD 02/11/21 2242

## 2021-02-11 NOTE — ED Notes (Signed)
Pt arrived via EMS from [facility] with complaints of right hip pain. Pt unable to bear weight per report. History of fixated femur fx. Denies injury/fall. Hx dementia and HOH. Awaiting MD

## 2021-02-11 NOTE — Discharge Instructions (Signed)
Laurie Fuentes has a fracture between two screws that are holding her femur together. Our orthopedist recommends transfer using other leg (non-weight bearing on the right leg) and follow up with him this week.

## 2021-02-13 ENCOUNTER — Other Ambulatory Visit
Admission: RE | Admit: 2021-02-13 | Discharge: 2021-02-13 | Disposition: A | Discharge status: Home / Self Care | Payer: Medicare Other | Source: Ambulatory Visit | Attending: Family Medicine | Admitting: Family Medicine

## 2021-02-13 DIAGNOSIS — R300 Dysuria: Secondary | ICD-10-CM | POA: Diagnosis present

## 2021-02-13 LAB — URINALYSIS, COMPLETE (UACMP) WITH MICROSCOPIC
Bacteria, UA: NONE SEEN
Bilirubin Urine: NEGATIVE
Glucose, UA: NEGATIVE mg/dL
Hgb urine dipstick: NEGATIVE
Ketones, ur: 5 mg/dL — AB
Nitrite: NEGATIVE
Protein, ur: NEGATIVE mg/dL
Specific Gravity, Urine: 1.027 (ref 1.005–1.030)
pH: 5 (ref 5.0–8.0)

## 2021-02-14 LAB — URINE CULTURE: Culture: NO GROWTH

## 2021-02-28 ENCOUNTER — Other Ambulatory Visit: Payer: Self-pay

## 2021-02-28 ENCOUNTER — Non-Acute Institutional Stay: Payer: Medicare Other | Admitting: Primary Care

## 2021-02-28 DIAGNOSIS — Z515 Encounter for palliative care: Secondary | ICD-10-CM

## 2021-02-28 DIAGNOSIS — I679 Cerebrovascular disease, unspecified: Secondary | ICD-10-CM

## 2021-02-28 NOTE — Progress Notes (Signed)
[facility] Consult Note Telephone: [phone]  Fax: [phone]    Date of encounter: 02/28/21 PATIENT NAME: Laurie Fuentes [address] [place] [address]   [phone] (home)  DOB: [birth year] MRN: [id number] PRIMARY CARE PROVIDER:   Brayton Mars, [place] [address] [phone]  REFERRING PROVIDER:   Brayton Mars, [place] [address] [place] [address] [phone]  RESPONSIBLE PARTY:    Contact Information    Name Relation Home Work Mobile   Bar[place]ton Granddaughter [phone]     Kallie Edward Daughter   [phone]   Neomia Dear   [phone]   Elton Sin [phone]        I met face to face with patient in facility. Palliative Care was asked to follow this patient by consultation request of  Brayton Mars, MD to address advance care planning and complex medical decision making. This is a follow up visit.                                   ASSESSMENT AND PLAN / RECOMMENDATIONS:   Advance Care Planning/Goals of Care: Goals include to maximize quality of life and symptom management.   CODE STATUS: DNR  Symptom Management/Plan:  Staff reports patient pain that was assessed as fx between two previous screws in hip hardware. Ordered non weight bearing but staff reports she still goes to bathroom by herself. This was her habit prior and her dementia is so advanced she cannot retain not to go alone. HOH remains very difficult to interview/converse.   She appears to have gained weight and staff endorse she has a hearty appetite. Her albumin in ED however was decreased. Recommend monitoring for edema, Recommend monitoring for falls and assist with transfers.   Follow up Palliative Care Visit: Palliative care will continue to follow for complex medical decision making, advance care planning, and clarification of goals. Return 4-6 weeks or prn.  I  spent 15 minutes providing this consultation. More than 50% of the time in this consultation was spent in counseling and care coordination.  PPS: 40%  HOSPICE ELIGIBILITY/DIAGNOSIS: no  Chief Complaint: pain in hip, immobility  HISTORY OF PRESENT ILLNESS:  Laurie Fuentes is a 87 y.o. year old female  with debility, frailty, dementia, hip fx d/t hardware. She is at her baseline with mentation and activity but in w/c due to NWb order.   History obtained from review of EMR, discussion with primary team, and interview with family, facility staff/caregiver and/or Ms. Yarde.  I reviewed available labs, medications, imaging, studies and related documents from the EMR.  Records reviewed and summarized above.   ROS  General: NAD Pulmonary: denies cough, denies increased SOB Abdomen: endorses good appetite, denies constipation, endorses continence of bowel GU: denies dysuria, endorses continence of urine MSK:  endorses weakness,  no falls reported Skin: denies rashes or wounds Neurological: + pain, denies insomnia Psych: Endorses positive mood Heme/lymph/immuno: denies bruises, abnormal bleeding  Physical Exam: Current and past weights: 1/22 = 132 lbs; 4/22 = 139.4 Constitutional: NAD  General: frail appearing, thin EYES: anicteric sclera, lids intact, no discharge  ENMT: hard of  hearing, oral mucous membranes moist CV: 1+ LE edema Pulmonary:  no increased work of breathing, no cough, room air Abdomen: intake 75%, no ascites MSK: ++ sarcopenia, moves all extremities, ambulatory ( order NWB) Skin: warm  and dry, no rashes or wounds on visible skin Neuro:  ++ generalized weakness,  Severe cognitive impairment Psych: non-anxious affect, A and O x 1 Hem/lymph/immuno: no widespread bruising  Thank you for the opportunity to participate in the care of Ms. Rosenfield.  The palliative care team will continue to follow. Please call our office at [phone] if we can be of additional assistance.    Jason Coop, NP , DNP, MPH, AGPCNP-BC, ACHPN  COVID-19 PATIENT SCREENING TOOL Asked and negative response unless otherwise noted:   Have you had symptoms of covid, tested positive or been in contact with someone with symptoms/positive test in the past 5-10 days?

## 2021-05-08 IMAGING — CT CT CERVICAL SPINE W/O CM
5 of 8 series · 15 of 33 positions shown, 16 images · non-contrast
Comparison: CT scan of May 02, 2018.

CLINICAL DATA: Fall last night.

EXAM:
CT HEAD WITHOUT CONTRAST
CT CERVICAL SPINE WITHOUT CONTRAST
TECHNIQUE: Multidetector CT imaging of the head and cervical spine was
performed following the standard protocol without intravenous
contrast. Multiplanar CT image reconstructions of the cervical spine
were also generated.

[Series 2: head bone · axial · 0.42mm/px · z∈[-82,-34]mm · 2 of 73 slices shown]
[im 25/73  bone]
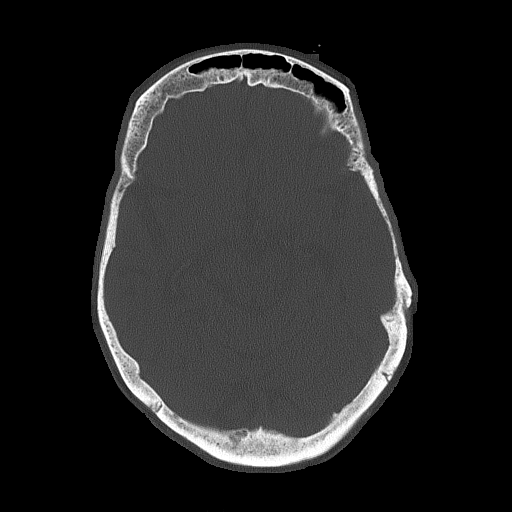
[im 49/73  bone]
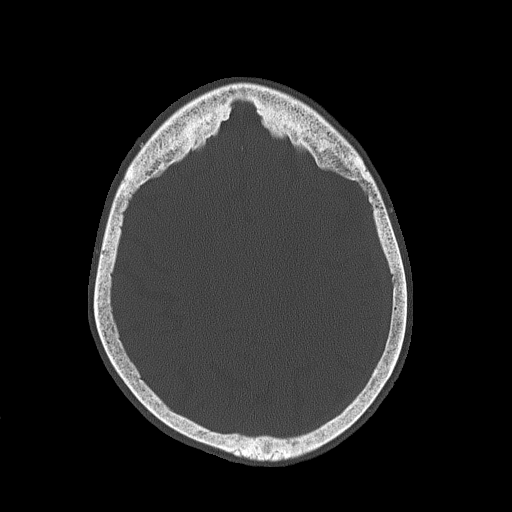

[Series 5: coronal soft tissue · coronal · 0.33mm/px · 3 of 65 slices shown]
[im 17/65  bone]
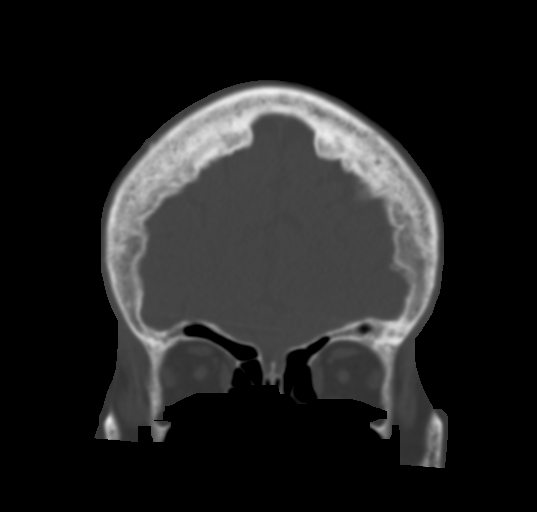
[im 33/65  bone]
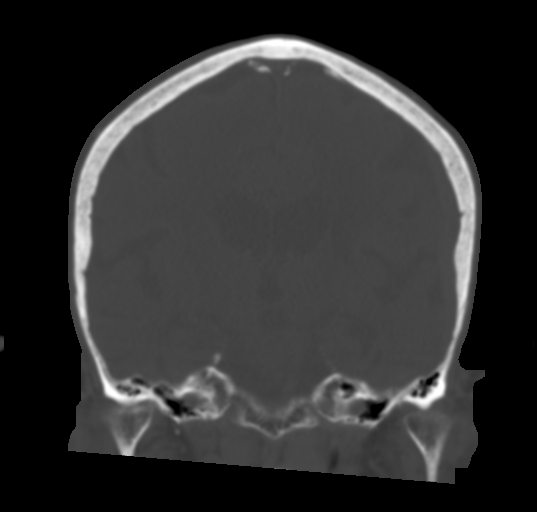
[im 49/65  bone]
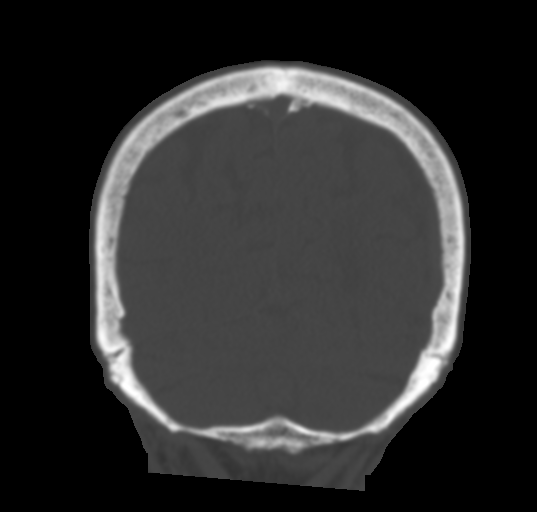

[Series 7: c spine soft · axial · 0.37mm/px · z∈[-211,-157]mm · 2 of 73 slices shown]
[im 25/73  soft-tissue]
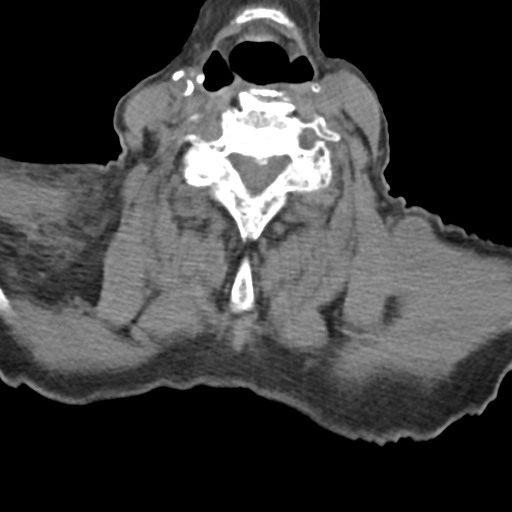
[im 49/73  soft-tissue]
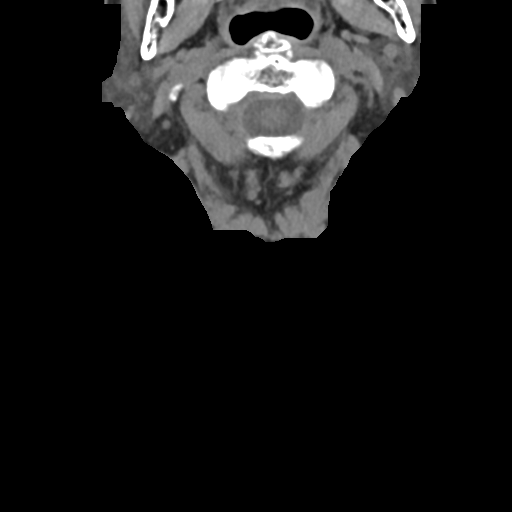

[Series 10: sagittal bone · sagittal · 0.30mm/px · 5 of 58 slices shown]
[im 10/58  bone]
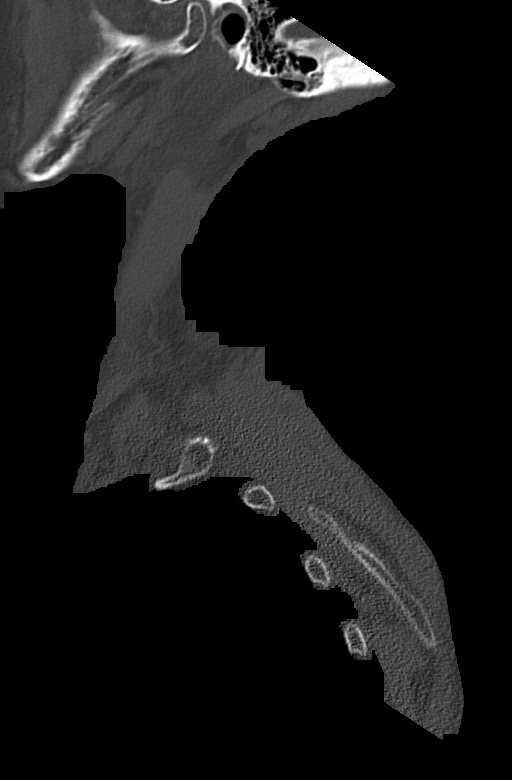
[im 20/58  bone]
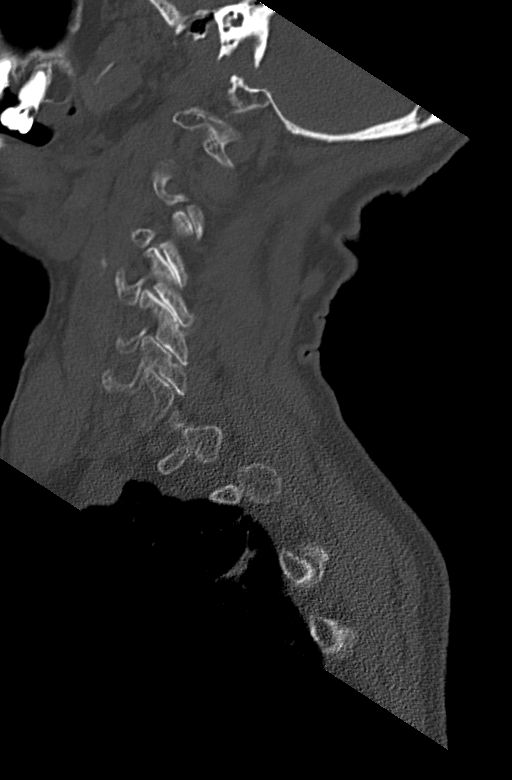
[im 29/58  bone]
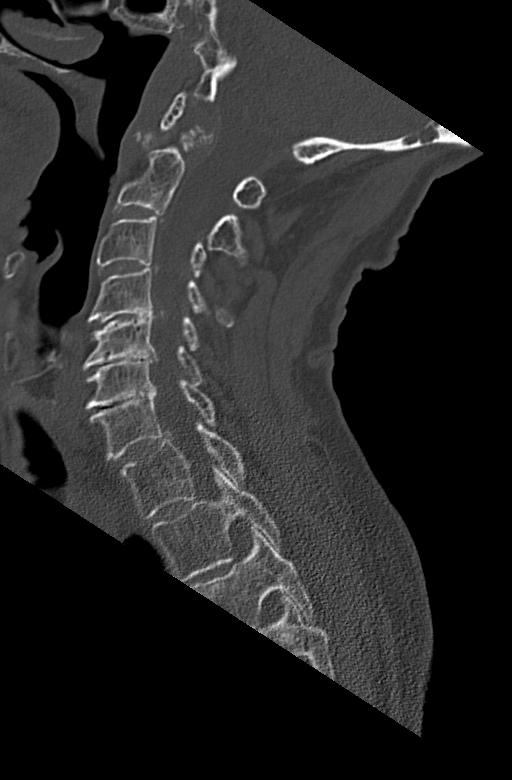
[im 39/58  bone]
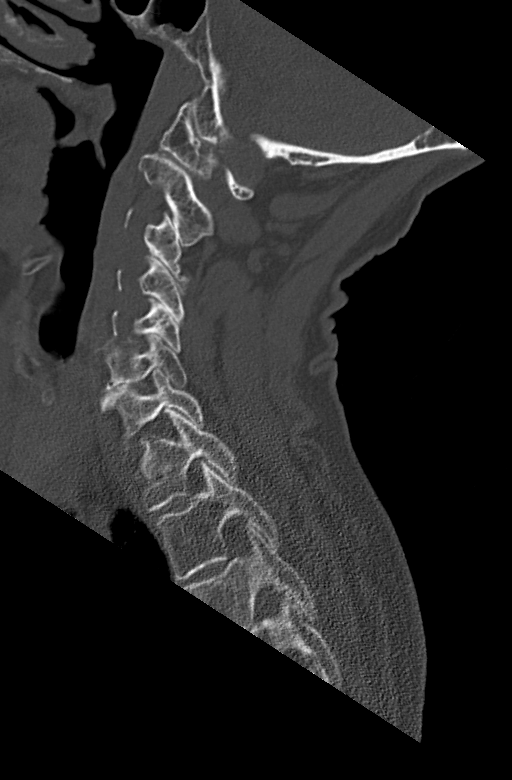
[im 48/58  bone]
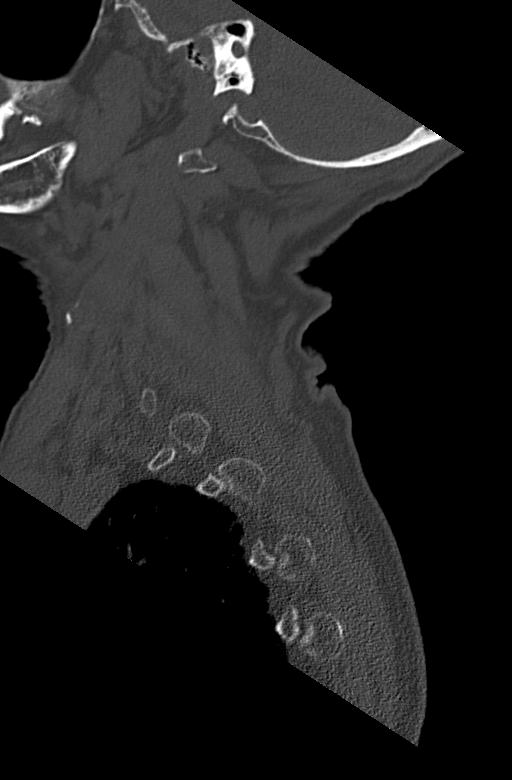

[Series 12: orthogonal bone · axial · 0.23mm/px · z∈[-272,-186]mm · 3 of 105 slices shown, 4 images]
[im 27/105  soft-tissue]
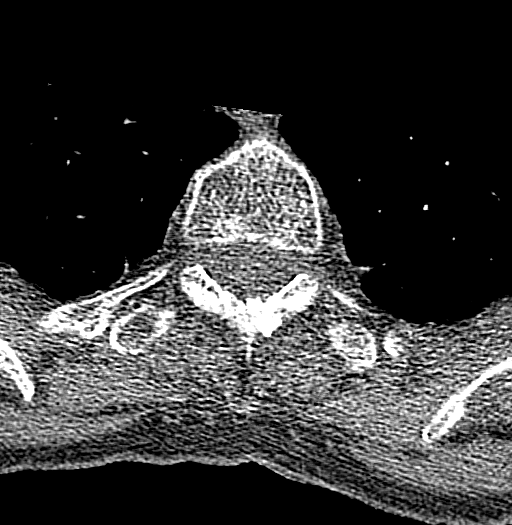
[im 27/105  bone]
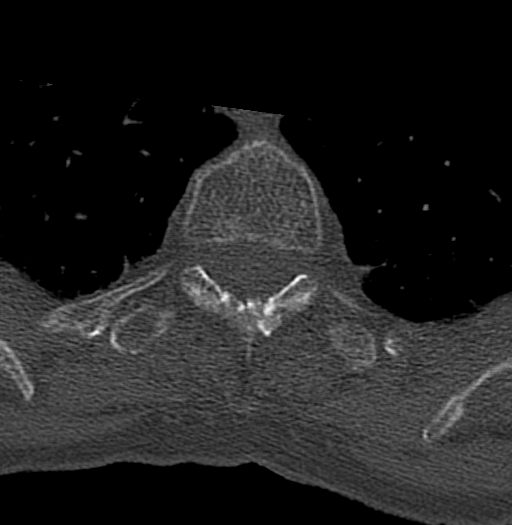
[im 53/105  bone]
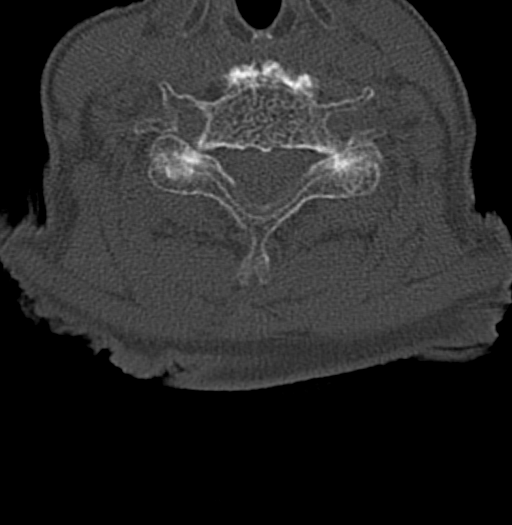
[im 79/105  bone]
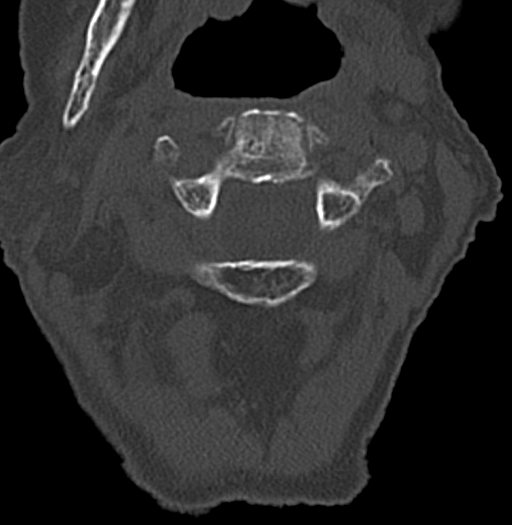

[15 of 33 positions shown; findings below may reference images not displayed]

FINDINGS: CT HEAD FINDINGS

Brain: Mild chronic ischemic white matter disease is noted. Old
infarcts are seen involving both basal ganglia. Small subdural
hematoma is seen overlying left cerebral convexity with maximum
thickness of 6 mm. No significant mass effect or midline shift is
noted. Ventricular size is within normal limits. No mass lesion or
acute infarction is noted.

Vascular: No hyperdense vessel or unexpected calcification.

Skull: Normal. Negative for fracture or focal lesion.

Sinuses/Orbits: No acute finding.

Other: None.

CT CERVICAL SPINE FINDINGS

Alignment: Normal.

Skull base and vertebrae: No acute fracture. No primary bone lesion
or focal pathologic process.

Soft tissues and spinal canal: No prevertebral fluid or swelling. No
visible canal hematoma.

Disc levels: Severe degenerative disc disease is noted at C4-5, C5-6
and C6-7 with anterior posterior osteophyte formation

Upper chest: Negative.

Other: Degenerative changes are seen involving posterior facet
joints bilaterally.
IMPRESSION: Small subdural hematoma is seen overlying left cerebral convexity.
No definite mass effect or midline shift is noted. Critical
Value/emergent results were called by telephone at the time of
interpretation on 07/21/2019 at [DATE] to providerDr. Zintalem, who
verbally acknowledged these results.

Severe multilevel degenerative disc disease. No acute abnormality
seen in the cervical spine.

## 2021-07-13 ENCOUNTER — Other Ambulatory Visit: Payer: Self-pay

## 2021-07-13 ENCOUNTER — Non-Acute Institutional Stay: Payer: Medicare Other | Admitting: Primary Care

## 2021-07-13 DIAGNOSIS — I679 Cerebrovascular disease, unspecified: Secondary | ICD-10-CM

## 2021-07-13 DIAGNOSIS — Z515 Encounter for palliative care: Secondary | ICD-10-CM

## 2021-07-13 NOTE — Progress Notes (Signed)
[facility] Consult Note Telephone: [phone]  Fax: [phone]    Date of encounter: 07/13/21 1:26 PM PATIENT NAME: Laurie Fuentes [address] [place] [address]   [phone] (home)  DOB: Aug 21, 1926 MRN: [id number] PRIMARY CARE PROVIDER:    Brayton Mars, MD,  [address] [place] [address] [phone]  [place]:   Brayton Mars, [place],  [place] [address] [phone]  RESPONSIBLE PARTY:    Contact Information     Name Relation Home Work Kyle Granddaughter [phone]     Kallie Edward Daughter   [phone]   Neomia Dear   [phone]   Elton Sin [phone]         I met face to face with patient in [facility] facility. Palliative Care was asked to follow this patient by consultation request of  Brayton Mars, MD to address advance care planning and complex medical decision making. This is a follow up visit.                                   ASSESSMENT AND PLAN / RECOMMENDATIONS:   Advance Care Planning/Goals of Care: Goals include to maximize quality of life and symptom management.  CODE STATUS: DNR  Symptom Management/Plan:  Patient is up in w/c, able to ambulate self in halls. Appears comfortable, calm. Staff endorses she asks to go in the bath room but not able to use the toilet.  She is HOH. Has lesion on R wrist, now bandaged. Staff endorses she is eating well. No falls reported.   Follow up Palliative Care Visit: Palliative care will continue to follow for complex medical decision making, advance care planning, and clarification of goals. Return 8 weeks or prn.  I spent 25 minutes providing this consultation. More than 50% of the time in this consultation was spent in counseling and care coordination.  PPS: 30%  HOSPICE ELIGIBILITY/DIAGNOSIS: TBD  Chief Complaint: debility  HISTORY OF PRESENT ILLNESS:  [name] is a 87 y.o. year old female  with debility, dementia.   History obtained from review of EMR, discussion with primary team, and interview with family, facility staff/caregiver and/or Ms. Haselton.  I reviewed available labs, medications, imaging, studies and related documents from the EMR.  Records reviewed and summarized above.   ROS/staff  General: NAD ENMT: denies dysphagia Cardiovascular: denies chest pain, denies DOE Pulmonary: denies cough, denies increased SOB Abdomen: endorses good appetite, denies constipation, endorses incontinence of bowel GU: denies dysuria, endorses incontinence of urine MSK:  endorses weakness,  no falls reported Skin: denies rashes or wounds Neurological: denies pain, denies insomnia Psych: Endorses agitated  mood Heme/lymph/immuno: denies bruises, abnormal bleeding  Physical Exam: Current and past weights: 134.5 lbs, wt gain Constitutional: NAD General: frail appearing, thin EYES: anicteric sclera, lids intact, no discharge  ENMT: hard of hearing, oral mucous membranes moist CV: S1S2, RRR, no LE edema Pulmonary: LCTA, no increased work of breathing, no cough, room air Abdomen: intake 100%, normo-active BS + 4 quadrants, soft and non tender, no ascites GU: deferred MSK: no sarcopenia, moves all extremities, ambulatory Skin: warm and dry, no rashes or wounds on visible skin Neuro:  no generalized weakness,  no cognitive impairment Psych: non-anxious affect, A and O x 3 Hem/lymph/immuno: no widespread bruising  Outpatient Encounter Medications as of 07/13/2021  Medication Sig  acetaminophen (TYLENOL) 500 MG tablet Take 500-1,000 mg by mouth every 6 (six) hours as needed for mild pain or fever.    alendronate (FOSAMAX) 70 MG tablet Take 70 mg by mouth once a week.   amLODipine (NORVASC) 5 MG tablet Take 1 tablet (5 mg total) by mouth daily.   calcium carbonate (TUMS - DOSED IN MG ELEMENTAL CALCIUM) 500 MG chewable tablet Chew by mouth.    feeding supplement, ENSURE ENLIVE, (ENSURE ENLIVE) LIQD Take 237 mLs by mouth 2 (two) times daily between meals.   Melatonin 3 MG TABS Take 1 tablet by mouth at bedtime.   naproxen sodium (ANAPROX) 275 MG tablet Take 550 mg by mouth 2 (two) times daily with a meal.   omeprazole (PRILOSEC) 40 MG capsule Take 40 mg by mouth daily.   polyethylene glycol (MIRALAX / GLYCOLAX) 17 g packet Take 17 g by mouth daily.   sennosides-docusate sodium (SENOKOT-S) 8.6-50 MG tablet Take 2 tablets by mouth daily.   traMADol (ULTRAM) 50 MG tablet tramadol 50 mg tablet   [DISCONTINUED] traMADol (ULTRAM) 50 MG tablet Take 50 mg by mouth 3 (three) times daily as needed.   No facility-administered encounter medications on file as of 07/13/2021.      Thank you for the opportunity to participate in the care of Ms. Hazelbaker.  The palliative care team will continue to follow. Please call our office at [phone] if we can be of additional assistance.   Jason Coop, NP   COVID-19 PATIENT SCREENING TOOL Asked and negative response unless otherwise noted:   Have you had symptoms of covid, tested positive or been in contact with someone with symptoms/positive test in the past 5-10 days?

## 2021-10-05 ENCOUNTER — Other Ambulatory Visit: Payer: Self-pay

## 2021-10-05 ENCOUNTER — Non-Acute Institutional Stay: Payer: Medicare Other | Admitting: Primary Care

## 2021-10-08 ENCOUNTER — Non-Acute Institutional Stay: Payer: Medicare Other | Admitting: Primary Care

## 2021-10-08 ENCOUNTER — Other Ambulatory Visit: Payer: Self-pay

## 2021-10-08 DIAGNOSIS — Z515 Encounter for palliative care: Secondary | ICD-10-CM

## 2021-10-08 DIAGNOSIS — I679 Cerebrovascular disease, unspecified: Secondary | ICD-10-CM

## 2021-10-08 NOTE — Progress Notes (Signed)
[facility] Consult Note Telephone: [phone]  Fax: [phone]    Date of encounter: 10/08/21 5:35 PM PATIENT NAME: Laurie Fuentes [address] [place] [address]   [phone] (home)  DOB: 09/27/1926 MRN: [id number] PRIMARY CARE PROVIDER:    Brayton Mars, MD,  [address] [place] [address] [phone]  [place]:   Brayton Mars, [place],  [place] [address] [phone]  RESPONSIBLE PARTY:    Contact Information     Name Relation Home Work [place] Granddaughter [phone]     Kallie Edward Daughter   [phone]   Neomia Dear   [phone]   Elton Sin [phone]          I met face to face with patient  in [facility] facility. Palliative Care was asked to follow this patient by consultation request of  Brayton Mars, MD 87 to address advance care planning and complex medical decision making. This is a follow up visit.                                   ASSESSMENT AND PLAN / RECOMMENDATIONS:   Advance Care Planning/Goals of Care: Goals include to maximize quality of life and symptom management.  CODE STATUS: DNR  Symptom Management/Plan:  Staff report patient at her baseline, she continues to perseverate on bathroom and voiding. Family visits but I was unable to reach granddaughter by phone today. Rang, no answer, no way to leave a message.   Patient appears more fatigued today than previous visit. Meds reviewed. She also has lost 2 lbs but appears more thin in the face. She is up and in her w/c in the hallways and can mobilize herself.  Follow up Palliative Care Visit: Palliative care will continue to follow for complex medical decision making, advance care planning, and clarification of goals. Return 12 weeks or prn.  I spent 15 minutes providing this consultation. More than 50% of the time in this consultation was spent in  counseling and care coordination.  PPS: 40%  HOSPICE ELIGIBILITY/DIAGNOSIS: TBD  Chief Complaint: dementia  HISTORY OF PRESENT ILLNESS:  Laurie Fuentes is a 87 y.o. year old female  with cva, dementia .   History obtained from review of EMR, discussion with primary team, and interview with family, facility staff/caregiver and/or Laurie Fuentes.  I reviewed available labs, medications, imaging, studies and related documents from the EMR.  Records reviewed and summarized above.   ROS  Laurie Fuentes report General: NAD ENMT: denies dysphagia Pulmonary: denies cough, denies increased SOB Abdomen: endorses good appetite, denies constipation, endorses continence of bowel GU: denies dysuria, endorses continence of urine MSK:  d endorses  weakness,  no falls reported Skin: R bandaged hand Neurological: denies pain, denies insomnia Psych: Endorses flat mood Heme/lymph/immuno: denies bruises, abnormal bleeding  Physical Exam: Current and past weights:132 lbs, modest loss 2 lbs in last few months Constitutional: NAD General: frail appearing, wnwd EYES: anicteric sclera, lids intact, no discharge  ENMT: hard of hearing, oral mucous membranes moist CV: 2+ LE edema Pulmonary: no increased work of breathing, no cough, room air Abdomen: intake 75-100%,, no ascites GU: deferred MSK: + sarcopenia, moves all extremities, ambulatory with assistance Skin: warm and dry, no rashes or wounds on visible skin, bandaged R hand Neuro:  + generalized weakness,  severe  cognitive impairment Psych: anxious affect, A  and O x 1 Hem/lymph/immuno: no widespread bruising   Thank you for the opportunity to participate in the care of Laurie Fuentes.  The palliative care team will continue to follow. Please call our office at [phone] if we can be of additional assistance.   Jason Coop, NP DNP, AGPCNP-BC  COVID-19 PATIENT SCREENING TOOL Asked and negative response unless otherwise noted:   Have you  had symptoms of covid, tested positive or been in contact with someone with symptoms/positive test in the past 5-10 days?

## 2021-12-10 ENCOUNTER — Emergency Department: Payer: Medicare Other

## 2021-12-10 ENCOUNTER — Encounter: Payer: Self-pay | Admitting: Internal Medicine

## 2021-12-10 ENCOUNTER — Inpatient Hospital Stay
Admission: EM | Admit: 2021-12-10 | Discharge: 2021-12-14 | DRG: 689 | Disposition: A | Discharge status: Skilled Nursing Facility | Payer: Medicare Other | Source: Skilled Nursing Facility | Attending: Internal Medicine | Admitting: Internal Medicine

## 2021-12-10 ENCOUNTER — Other Ambulatory Visit: Payer: Self-pay

## 2021-12-10 DIAGNOSIS — Z833 Family history of diabetes mellitus: Secondary | ICD-10-CM | POA: Diagnosis not present

## 2021-12-10 DIAGNOSIS — E46 Unspecified protein-calorie malnutrition: Secondary | ICD-10-CM | POA: Diagnosis present

## 2021-12-10 DIAGNOSIS — K219 Gastro-esophageal reflux disease without esophagitis: Secondary | ICD-10-CM | POA: Diagnosis present

## 2021-12-10 DIAGNOSIS — G9341 Metabolic encephalopathy: Secondary | ICD-10-CM

## 2021-12-10 DIAGNOSIS — N3090 Cystitis, unspecified without hematuria: Secondary | ICD-10-CM

## 2021-12-10 DIAGNOSIS — Z7983 Long term (current) use of bisphosphonates: Secondary | ICD-10-CM | POA: Diagnosis not present

## 2021-12-10 DIAGNOSIS — I4891 Unspecified atrial fibrillation: Secondary | ICD-10-CM | POA: Diagnosis present

## 2021-12-10 DIAGNOSIS — I10 Essential (primary) hypertension: Secondary | ICD-10-CM | POA: Diagnosis present

## 2021-12-10 DIAGNOSIS — Z79890 Hormone replacement therapy (postmenopausal): Secondary | ICD-10-CM

## 2021-12-10 DIAGNOSIS — F03918 Unspecified dementia, unspecified severity, with other behavioral disturbance: Secondary | ICD-10-CM | POA: Diagnosis not present

## 2021-12-10 DIAGNOSIS — Z681 Body mass index (BMI) 19 or less, adult: Secondary | ICD-10-CM

## 2021-12-10 DIAGNOSIS — Z66 Do not resuscitate: Secondary | ICD-10-CM | POA: Diagnosis present

## 2021-12-10 DIAGNOSIS — I69351 Hemiplegia and hemiparesis following cerebral infarction affecting right dominant side: Secondary | ICD-10-CM

## 2021-12-10 DIAGNOSIS — F0390 Unspecified dementia without behavioral disturbance: Secondary | ICD-10-CM | POA: Diagnosis present

## 2021-12-10 DIAGNOSIS — N390 Urinary tract infection, site not specified: Secondary | ICD-10-CM | POA: Diagnosis present

## 2021-12-10 DIAGNOSIS — Z86718 Personal history of other venous thrombosis and embolism: Secondary | ICD-10-CM | POA: Diagnosis not present

## 2021-12-10 DIAGNOSIS — Z20822 Contact with and (suspected) exposure to covid-19: Secondary | ICD-10-CM | POA: Diagnosis present

## 2021-12-10 DIAGNOSIS — R001 Bradycardia, unspecified: Secondary | ICD-10-CM | POA: Diagnosis present

## 2021-12-10 DIAGNOSIS — R4182 Altered mental status, unspecified: Secondary | ICD-10-CM

## 2021-12-10 LAB — BLOOD GAS, VENOUS
Acid-base deficit: 1.9 mmol/L (ref 0.0–2.0)
Bicarbonate: 24.7 mmol/L (ref 20.0–28.0)
O2 Saturation: 79.2 %
Patient temperature: 37
pCO2, Ven: 49 mmHg (ref 44.0–60.0)
pH, Ven: 7.31 (ref 7.250–7.430)
pO2, Ven: 48 mmHg — ABNORMAL HIGH (ref 32.0–45.0)

## 2021-12-10 LAB — URINALYSIS, COMPLETE (UACMP) WITH MICROSCOPIC
Glucose, UA: NEGATIVE mg/dL
Nitrite: POSITIVE — AB
Protein, ur: 30 mg/dL — AB
Specific Gravity, Urine: 1.025 (ref 1.005–1.030)
WBC, UA: >50 WBC/hpf — ABNORMAL HIGH (ref 0–5)
pH: 5.5 (ref 5.0–8.0)

## 2021-12-10 LAB — CBC WITH DIFFERENTIAL/PLATELET
Abs Immature Granulocytes: 0.03 10*3/uL (ref 0.00–0.07)
Basophils Absolute: 0.1 10*3/uL (ref 0.0–0.1)
Basophils Relative: 1 %
Eosinophils Absolute: 0.2 10*3/uL (ref 0.0–0.5)
Eosinophils Relative: 2 %
HCT: 36.6 % (ref 36.0–46.0)
Hemoglobin: 11.4 g/dL — ABNORMAL LOW (ref 12.0–15.0)
Immature Granulocytes: 0 %
Lymphocytes Relative: 15 %
Lymphs Abs: 1.1 10*3/uL (ref 0.7–4.0)
MCH: 27.6 pg (ref 26.0–34.0)
MCHC: 31.1 g/dL (ref 30.0–36.0)
MCV: 88.6 fL (ref 80.0–100.0)
Monocytes Absolute: 0.5 10*3/uL (ref 0.1–1.0)
Monocytes Relative: 6 %
Neutro Abs: 5.8 10*3/uL (ref 1.7–7.7)
Neutrophils Relative %: 76 %
Platelets: 265 10*3/uL (ref 150–400)
RBC: 4.13 MIL/uL (ref 3.87–5.11)
RDW: 14.4 % (ref 11.5–15.5)
WBC: 7.6 10*3/uL (ref 4.0–10.5)
nRBC: 0 % (ref 0.0–0.2)

## 2021-12-10 LAB — COMPREHENSIVE METABOLIC PANEL
ALT: 8 U/L (ref 0–44)
AST: 15 U/L (ref 15–41)
Albumin: 3 g/dL — ABNORMAL LOW (ref 3.5–5.0)
Alkaline Phosphatase: 70 U/L (ref 38–126)
Anion gap: 7 (ref 5–15)
BUN: 31 mg/dL — ABNORMAL HIGH (ref 8–23)
CO2: 21 mmol/L — ABNORMAL LOW (ref 22–32)
Calcium: 8.1 mg/dL — ABNORMAL LOW (ref 8.9–10.3)
Chloride: 111 mmol/L (ref 98–111)
Creatinine, Ser: 0.73 mg/dL (ref 0.44–1.00)
GFR, Estimated: >60 mL/min (ref >60)
Glucose, Bld: 113 mg/dL — ABNORMAL HIGH (ref 70–99)
Potassium: 3.6 mmol/L (ref 3.5–5.1)
Sodium: 139 mmol/L (ref 135–145)
Total Bilirubin: 0.6 mg/dL (ref 0.3–1.2)
Total Protein: 6 g/dL — ABNORMAL LOW (ref 6.5–8.1)

## 2021-12-10 LAB — T4, FREE: Free T4: 1.35 ng/dL — ABNORMAL HIGH (ref 0.61–1.12)

## 2021-12-10 LAB — MRSA NEXT GEN BY PCR, NASAL: MRSA by PCR Next Gen: DETECTED — AB

## 2021-12-10 LAB — TROPONIN I (HIGH SENSITIVITY)
Troponin I (High Sensitivity): 8 ng/L (ref <18)
Troponin I (High Sensitivity): 9 ng/L (ref <18)

## 2021-12-10 LAB — TSH: TSH: 1.776 u[IU]/mL (ref 0.350–4.500)

## 2021-12-10 LAB — RESP PANEL BY RT-PCR (FLU A&B, COVID) ARPGX2
Influenza A by PCR: NEGATIVE
Influenza B by PCR: NEGATIVE
SARS Coronavirus 2 by RT PCR: NEGATIVE

## 2021-12-10 MED ORDER — SODIUM CHLORIDE 0.9 % IV BOLUS
1000.0000 mL | Freq: Once | INTRAVENOUS | Status: AC
  Administered 2021-12-10: 1000 mL via INTRAVENOUS

## 2021-12-10 MED ORDER — ACETAMINOPHEN 325 MG PO TABS: 650.0000 mg | ORAL_TABLET | Freq: Four times a day (QID) | ORAL | Status: DC | PRN

## 2021-12-10 MED ORDER — ENOXAPARIN SODIUM 40 MG/0.4ML IJ SOSY
40.0000 mg | PREFILLED_SYRINGE | Freq: Every day | INTRAMUSCULAR | Status: DC
  Administered 2021-12-11 – 2021-12-14 (×4): 40 mg via SUBCUTANEOUS
  Filled 2021-12-10 (×4): qty 0.4

## 2021-12-10 MED ORDER — SODIUM CHLORIDE 0.9 % IV SOLN
1.0000 g | Freq: Once | INTRAVENOUS | Status: AC
  Administered 2021-12-10: 1 g via INTRAVENOUS
  Filled 2021-12-10: qty 10

## 2021-12-10 MED ORDER — SODIUM CHLORIDE 0.9 % IV SOLN
1.0000 g | INTRAVENOUS | Status: DC
  Administered 2021-12-11 – 2021-12-12 (×2): 1 g via INTRAVENOUS
  Filled 2021-12-10 (×3): qty 10

## 2021-12-10 MED ORDER — ONDANSETRON HCL 4 MG/2ML IJ SOLN: 4.0000 mg | Freq: Four times a day (QID) | INTRAMUSCULAR | Status: DC | PRN

## 2021-12-10 MED ORDER — OXYBUTYNIN CHLORIDE ER 5 MG PO TB24
5.0000 mg | ORAL_TABLET | Freq: Every day | ORAL | Status: DC
  Administered 2021-12-11 – 2021-12-14 (×4): 5 mg via ORAL
  Filled 2021-12-10 (×4): qty 1

## 2021-12-10 MED ORDER — ACETAMINOPHEN 650 MG RE SUPP: 650.0000 mg | Freq: Four times a day (QID) | RECTAL | Status: DC | PRN

## 2021-12-10 MED ORDER — MELATONIN 5 MG PO TABS
5.0000 mg | ORAL_TABLET | Freq: Every day | ORAL | Status: DC
Start: 2021-12-10 — End: 2021-12-14
  Administered 2021-12-11 – 2021-12-13 (×3): 5 mg via ORAL
  Filled 2021-12-10 (×3): qty 1

## 2021-12-10 MED ORDER — ONDANSETRON HCL 4 MG PO TABS: 4.0000 mg | ORAL_TABLET | Freq: Four times a day (QID) | ORAL | Status: DC | PRN

## 2021-12-10 MED ORDER — ENSURE ENLIVE PO LIQD
237.0000 mL | Freq: Two times a day (BID) | ORAL | Status: DC
  Administered 2021-12-11 – 2021-12-13 (×6): 237 mL via ORAL

## 2021-12-10 MED ORDER — AMLODIPINE BESYLATE 5 MG PO TABS
5.0000 mg | ORAL_TABLET | Freq: Every day | ORAL | Status: DC
  Administered 2021-12-11 – 2021-12-13 (×3): 5 mg via ORAL
  Filled 2021-12-10 (×3): qty 1

## 2021-12-10 MED ORDER — PANTOPRAZOLE SODIUM 40 MG PO TBEC
40.0000 mg | DELAYED_RELEASE_TABLET | Freq: Every day | ORAL | Status: DC
  Administered 2021-12-11 – 2021-12-14 (×4): 40 mg via ORAL
  Filled 2021-12-10 (×4): qty 1

## 2021-12-10 NOTE — H&P (Addendum)
History and Physical:    Laurie Fuentes   UYQ:[id number] DOB: 1926/05/25 DOA: 12/10/2021  Referring MD/provider: Brenton Grills, MD PCP: Brayton Mars, MD   Patient coming from: SNF  Chief Complaint: Lethargy/altered mental status  History of Present Illness:   Laurie Fuentes is a 88 y.o. female with medical history significant for stroke, subdural hematoma, dementia, hypertension, GERD, history of DVT, atrial fibrillation, fall, right clavicular fracture, rib fractures s/p repair, hearing impairment.  She was brought into nursing home because of altered mental status/lethargy.  Patient is confused and unable to provide any history.  History was obtained from [place], granddaughter, at the bedside.  She said she went to the nursing home to visit her this afternoon and she noticed that she was more confused than usual.  She became more and more lethargic and could not even eat her food.  She was told by one of the nursing staff that patient had watery stools today.  She said her blood pressure was elevated at the nursing home.  No reported vomiting, pain or fever.  ED Course:  The patient bradycardic, hypertensive and was found to have abnormal urinalysis consistent with UTI.  She was given 1 L of normal saline and IV Rocephin.  ROS:   ROS unable to obtain because of confusion/dementia  Past Medical History:   Past Medical History:  Diagnosis Date   GERD (gastroesophageal reflux disease)    Hypertension    Hypokalemia    Stroke [facility])     Past Surgical History:   Past Surgical History:  Procedure Laterality Date   GALLBLADDER SURGERY     HIP FRACTURE SURGERY Right     Social History:   Social History   Socioeconomic History   Marital status: Widowed    Spouse name: Not on file   Number of children: Not on file   Years of education: Not on file   Highest education level: Not on file  Occupational History   Not on file  Tobacco Use   Smoking status: Never    Smokeless tobacco: Never  Substance and Sexual Activity   Alcohol use: No    Alcohol/week: 0.0 standard drinks   Drug use: No   Sexual activity: Never  Other Topics Concern   Not on file  Social History Narrative   Lives at home with her grandson.  Has a walker.   Social Determinants of Health   Financial Resource Strain: Not on file  Food Insecurity: Not on file  Transportation Needs: Not on file  Physical Activity: Not on file  Stress: Not on file  Social Connections: Not on file  Intimate Partner Violence: Not on file    Allergies   Patient has no known allergies.  Family history:   Family History  Problem Relation Age of Onset   Diabetes Sister     Current Medications:   Prior to Admission medications   Medication Sig Start Date End Date Taking? Authorizing Provider  amLODipine (NORVASC) 5 MG tablet Take 1 tablet (5 mg total) by mouth daily. 07/27/19  Yes Dustin Flock, MD  Melatonin 3 MG TABS Take 1 tablet by mouth at bedtime.   Yes [provider]  naproxen sodium (ANAPROX) 550 MG tablet Take 550 mg by mouth 2 (two) times daily with a meal. 12/06/21  Yes [provider]  omeprazole (PRILOSEC) 40 MG capsule Take 40 mg by mouth daily. 07/12/21  Yes [provider]  oxybutynin (DITROPAN-XL) 5 MG 24 hr  tablet Take 5 mg by mouth daily. 12/06/21  Yes [provider]  polyethylene glycol (MIRALAX / GLYCOLAX) 17 g packet Take 17 g by mouth daily.   Yes [provider]  sennosides-docusate sodium (SENOKOT-S) 8.6-50 MG tablet Take 2 tablets by mouth daily.   Yes [provider]  acetaminophen (TYLENOL) 500 MG tablet Take 500-1,000 mg by mouth every 6 (six) hours as needed for mild pain or fever.     [provider]  alendronate (FOSAMAX) 70 MG tablet Take 70 mg by mouth once a week. 07/12/21   [provider]  calcium carbonate (TUMS - DOSED IN MG ELEMENTAL CALCIUM) 500 MG chewable tablet Chew by mouth. 05/10/21    [provider]  feeding supplement, ENSURE ENLIVE, (ENSURE ENLIVE) LIQD Take 237 mLs by mouth 2 (two) times daily between meals. 07/27/19   Dustin Flock, MD    Physical Exam:   Vitals:   12/10/21 1359 12/10/21 1401 12/10/21 1600  BP:  (!) 161/55 (!) 176/97  Pulse:  (!) 58 (!) 49  Resp:  18 (!) 27  Temp:  98.4 F (36.9 C)   TempSrc:  Oral   SpO2:  100% 100%  Weight: 54.4 kg    Height: 5\' 5"  (1.651 m)       Physical Exam: Blood pressure (!) 176/97, pulse (!) 49, temperature 98.4 F (36.9 C), temperature source Oral, resp. rate (!) 27, height 5\' 5"  (1.651 m), weight 54.4 kg, SpO2 100 %. Gen: No acute distress. Head: Normocephalic, atraumatic. Eyes: Pupils equal, round and reactive to light. Extraocular movements intact.  Sclerae nonicteric.  Mouth: Dry mucous membranes Neck: Supple,no jugular venous distention. Chest: Lungs are clear to auscultation with good air movement. No rales, rhonchi or wheezes.  CV: Heart sounds are regular with an S1, S2. No murmurs, rubs or gallops.  Abdomen: Soft, nontender, nondistended with normal active bowel sounds. No palpable masses. Extremities: Extremities are without clubbing, or cyanosis. No edema. Pedal pulses 2+.  Skin: Warm and dry.  Chronic skin growth/skin cancer on right hand Neuro: Alert but disoriented Psych: Poor insight and judgment.   Data Review:    Labs: Basic Metabolic Panel: Recent Labs  Lab 12/10/21 1403  NA 139  K 3.6  CL 111  CO2 21*  GLUCOSE 113*  BUN 31*  CREATININE 0.73  CALCIUM 8.1*   Liver Function Tests: Recent Labs  Lab 12/10/21 1403  AST 15  ALT 8  ALKPHOS 70  BILITOT 0.6  PROT 6.0*  ALBUMIN 3.0*   No results for input(s): LIPASE, AMYLASE in the last 168 hours. No results for input(s): AMMONIA in the last 168 hours. CBC: Recent Labs  Lab 12/10/21 1403  WBC 7.6  NEUTROABS 5.8  HGB 11.4*  HCT 36.6  MCV 88.6  PLT 265   Cardiac Enzymes: No results for input(s): CKTOTAL,  CKMB, CKMBINDEX, TROPONINI in the last 168 hours.  BNP (last 3 results) No results for input(s): PROBNP in the last 8760 hours. CBG: No results for input(s): GLUCAP in the last 168 hours.  Urinalysis    Component Value Date/Time   COLORURINE YELLOW 12/10/2021 1403   APPEARANCEUR CLOUDY (A) 12/10/2021 1403   APPEARANCEUR Clear 12/04/2011 2049   LABSPEC 1.025 12/10/2021 1403   LABSPEC 1.012 12/04/2011 2049   PHURINE 5.5 12/10/2021 1403   GLUCOSEU NEGATIVE 12/10/2021 1403   GLUCOSEU Negative 12/04/2011 2049   HGBUR TRACE (A) 12/10/2021 1403   BILIRUBINUR SMALL (A) 12/10/2021 1403   BILIRUBINUR Negative  12/04/2011 2049   KETONESUR TRACE (A) 12/10/2021 1403   PROTEINUR 30 (A) 12/10/2021 1403   NITRITE POSITIVE (A) 12/10/2021 1403   LEUKOCYTESUR SMALL (A) 12/10/2021 1403   LEUKOCYTESUR Negative 12/04/2011 2049      Radiographic Studies: CT HEAD WO CONTRAST (5MM)  Result Date: 12/10/2021 CLINICAL DATA:  Mental status change.  History of prior stroke. EXAM: CT HEAD WITHOUT CONTRAST TECHNIQUE: Contiguous axial images were obtained from the base of the skull through the vertex without intravenous contrast. RADIATION DOSE REDUCTION: This exam was performed according to the departmental dose-optimization program which includes automated exposure control, adjustment of the mA and/or kV according to patient size and/or use of iterative reconstruction technique. COMPARISON:  07/21/2019 FINDINGS: Brain: Expected cerebral and cerebellar atrophy for age. Moderate low density in the periventricular white matter likely related to small vessel disease. Somewhat more confluent hypoattenuation in the left frontal deep white matter is similar and may represent remote infarct. No mass lesion, hemorrhage, hydrocephalus, acute infarct, intra-axial, or extra-axial fluid collection. Vascular: Intracranial atherosclerosis. Skull: Hyperostosis frontalis interna. Venous lakes within the occipital region.  Sinuses/Orbits: Normal imaged portions of the orbits and globes. Clear paranasal sinuses and mastoid air cells. Other: None. IMPRESSION: 1.  No acute intracranial abnormality. 2.  Cerebral atrophy and small vessel ischemic change. Electronically Signed   By: Abigail Miyamoto M.D.   On: 12/10/2021 14:47   DG Chest Portable 1 View  Result Date: 12/10/2021 CLINICAL DATA:  Shortness of breath.  Altered mental status. EXAM: PORTABLE CHEST 1 VIEW COMPARISON:  07/21/2019 FINDINGS: The heart size and mediastinal contours are within normal limits. Aortic atherosclerotic calcification noted. Both lungs are clear. Old fracture deformities of the right humeral neck and clavicle are again noted. IMPRESSION: No active disease. Electronically Signed   By: Marlaine Hind M.D.   On: 12/10/2021 14:28    EKG: Independently reviewed by me.  Normal sinus rhythm, RBBB   Assessment/Plan:   Principal Problem:   Acute UTI Active Problems:   Acute metabolic encephalopathy    Body mass index is 19.97 kg/m.   Acute UTI: Admit to MedSurg.  Treat with IV Rocephin.  She was given IV fluids in the ED.  Follow-up urine culture.  Acute metabolic encephalopathy with underlying dementia: This is likely from acute UTI.  Provide supportive care.  No evidence of hypercapnia on venous blood gas.  No acute abnormality on CT head.  Hypertension: Continue antihypertensives  Mild sinus bradycardia: Asymptomatic  Other information:   DVT prophylaxis: Lovenox  Code Status: DNR.  This was confirmed by family at the bedside Family Communication: Plan discussed with [name] (daughter) and [name] (granddaughter) at the bedside Disposition Plan: Plan to discharge to SNF in 2 to 3 days Consults called: None Admission status: Inpatient  The medical decision making on this patient was of high complexity and the patient is at high risk for clinical deterioration, therefore this is a level 3 visit.    Johann Gascoigne [facility] Pager: Please check [email or web address]   How to contact the [facility] Attending or Consulting provider [place] or covering provider during after hours [place], for this patient?   Check the care team in [facility] and look for a) attending/consulting [facility] provider listed and b) the [facility] team listed Log into [email or web address] and use [place] universal password to access. If you do not have the password, please contact the hospital operator. Locate the [facility] provider you are looking for under [facility] and page  to a number that you can be directly reached. If you still have difficulty reaching the provider, please page the [facility] (Director on Call) for the Hospitalists listed on amion for assistance.  12/10/2021, 4:47 PM

## 2021-12-10 NOTE — ED Notes (Signed)
Pt's daughter and granddaughter at the beside. Per granddaughter, pt can normally eat by herself and cannot do that today. States that nx facility mentioned that the pt might have received some pain medicine that couldve caused AMS, but staff was unable to tell family what medicine and for what because the pt's primary nurse at the facility was at lunch during that time.

## 2021-12-10 NOTE — ED Triage Notes (Signed)
Pt BIB EMS from [facility] c/o [place]. Per EMS, nurse reported that the pt "possibly" had bfast this morning, normally in the hallway screaming but not doing that today. LWN last Friday by daughter. H/O Stroke, HOH, ?blind.   EMS vs pta: 103/62 HR 55 97% RA CBG 143

## 2021-12-10 NOTE — ED Provider Notes (Signed)
[facility] Provider Note    Event Date/Time   First MD Initiated Contact with Patient 12/10/21 1356     (approximate)   History   Altered Mental Status   HPI  Laurie Fuentes is a 88 y.o. female with past medical history of GERD, hypertension, prior CVA with residual right-sided deficits who presents with altered mental status.  Patient per her daughter and granddaughter is normally quite active, she is able to speak and eat on her own, and is frequently yelling at other members of the facility.  Today when her granddaughter went and saw her she was somnolent having difficulty eating storing food in her mouth and was drooling some and not talking.  Apparently she ate her breakfast at 8 AM as this is the last time that she was normal.  Her family last saw her on Friday and she was acting normal.  To their knowledge she has not had any vomiting or fevers.  Patient is not able to provide history.     Past Medical History:  Diagnosis Date   GERD (gastroesophageal reflux disease)    Hypertension    Hypokalemia    Stroke [facility])     Patient Active Problem List   Diagnosis Date Noted   Palliative care by specialist    Goals of care, counseling/discussion    Fall    Subdural hematoma    Closed displaced fracture of shaft of right clavicle    UTI (urinary tract infection) 07/21/2019   Rib contusion, left, subsequent encounter 08/08/2017   Chronic atrial fibrillation ([place]) 05/28/2016   Hypokalemia 05/28/2016   Anticoagulant long-term use 04/02/2016   Hip fracture requiring operative repair ([place]) 01/07/2016   Essential hypertension 11/29/2015   Cerebral vascular disease 11/29/2015   GERD (gastroesophageal reflux disease) 06/14/2015   DVT of lower extremity (deep venous thrombosis) ([place]) 06/24/2014     Physical Exam  Triage Vital Signs: ED Triage Vitals  Enc Vitals Group     BP 12/10/21 1401 (!) 161/55     Pulse Rate 12/10/21 1401 (!) 58     Resp  12/10/21 1401 18     Temp 12/10/21 1401 98.4 F (36.9 C)     Temp Source 12/10/21 1401 Oral     SpO2 12/10/21 1401 100 %     Weight 12/10/21 1359 120 lb (54.4 kg)     Height 12/10/21 1359 5\' 5"  (1.651 m)     Head Circumference --      Peak Flow --      Pain Score --      Pain Loc --      Pain Edu? --      Excl. in [place]? --     Most recent vital signs: Vitals:   12/10/21 1401  BP: (!) 161/55  Pulse: (!) 58  Resp: 18  Temp: 98.4 F (36.9 C)  SpO2: 100%     General: Awake, no distress.  Appears chronically ill CV:  Good peripheral perfusion.  No lower extremity edema Resp:  Normal effort.  Abd:  No distention.  Soft and nontender Neuro:             Opens her eyes and tracks to voice, hard of hearing, as are small but reactive bilaterally Right-sided facial droop, right arm held to her body, able to follow commands in the left arm, does not move the bilateral lower extremities Other:     ED Results / Procedures / Treatments  Labs (  all labs ordered are listed, but only abnormal results are displayed) Labs Reviewed  CBC WITH DIFFERENTIAL/PLATELET - Abnormal; Notable for the following components:      Result Value   Hemoglobin 11.4 (*)    All other components within normal limits  COMPREHENSIVE METABOLIC PANEL  URINALYSIS, COMPLETE (UACMP) WITH MICROSCOPIC  BLOOD GAS, VENOUS  TSH  T4, FREE  TROPONIN I (HIGH SENSITIVITY)     EKG  EKG interpreted by myself, normal sinus rhythm, right bundle branch block, LVH, no acute ischemic changes   RADIOLOGY I reviewed the CXR which does not show any acute cardiopulmonary process; agree with radiology report     PROCEDURES:  Critical Care performed: No  .1-3 Lead EKG Interpretation Performed by: Rada Hay, MD Authorized by: Rada Hay, MD     Interpretation: normal     ECG rate assessment: normal     Rhythm: sinus rhythm     Ectopy: none     Conduction: normal    The patient is on the cardiac  monitor to evaluate for evidence of arrhythmia and/or significant heart rate changes.   MEDICATIONS ORDERED IN ED: Medications  sodium chloride 0.9 % bolus 1,000 mL (1,000 mLs Intravenous New Bag/Given 12/10/21 1405)     IMPRESSION / MDM / ASSESSMENT AND PLAN / ED COURSE  I reviewed the triage vital signs and the nursing notes.                              Differential diagnosis includes, but is not limited to, intracranial hemorrhage, metabolic abnormality, dehydration, infection, sepsis, hypercarbia  Patient is a 88 year old female who has residual right-sided weakness from prior CVA and dementia who presents with altered mental status.  Normally is quite awake and active and today has been rather somnolent.  On exam patient appears chronically ill but is not in distress.  She does have a slight right-sided facial droop and right arm weakness but this is likely chronic prior CVA.  She is able to follow commands on the left side.  Abdomen is soft nontender does not appear volume overloaded.  Will work-up broadly for altered mental status with CT head infectious work-up as well as checking thyroid and VBG.  Ultimately and less significantly improving and returning closer to baseline will likely need admission.  , Signed out she is pending regarding her work-up.      FINAL CLINICAL IMPRESSION(S) / ED DIAGNOSES   Final diagnoses:  Altered mental status, unspecified altered mental status type     Rx / DC Orders   ED Discharge Orders     None        Note:  This document was prepared using Dragon voice recognition software and may include unintentional dictation errors.   Rada Hay, MD 12/10/21 1450

## 2021-12-10 NOTE — ED Provider Notes (Signed)
Procedures     ----------------------------------------- 4:24 PM on 12/10/2021 ----------------------------------------- Labs reveal a urinary tract infection, likely causing acute metabolic encephalopathy as the cause of her altered mental status.  Urine culture and IV Rocephin ordered.  Case discussed with hospitalist for further management.     Carrie Mew, MD 12/10/21 [phone]

## 2021-12-11 LAB — CBC
HCT: 30 % — ABNORMAL LOW (ref 36.0–46.0)
Hemoglobin: 9.5 g/dL — ABNORMAL LOW (ref 12.0–15.0)
MCH: 27.4 pg (ref 26.0–34.0)
MCHC: 31.7 g/dL (ref 30.0–36.0)
MCV: 86.5 fL (ref 80.0–100.0)
Platelets: 232 10*3/uL (ref 150–400)
RBC: 3.47 MIL/uL — ABNORMAL LOW (ref 3.87–5.11)
RDW: 14.7 % (ref 11.5–15.5)
WBC: 5.4 10*3/uL (ref 4.0–10.5)
nRBC: 0 % (ref 0.0–0.2)

## 2021-12-11 LAB — BASIC METABOLIC PANEL
Anion gap: 3 — ABNORMAL LOW (ref 5–15)
BUN: 26 mg/dL — ABNORMAL HIGH (ref 8–23)
CO2: 25 mmol/L (ref 22–32)
Calcium: 8.2 mg/dL — ABNORMAL LOW (ref 8.9–10.3)
Chloride: 110 mmol/L (ref 98–111)
Creatinine, Ser: 0.68 mg/dL (ref 0.44–1.00)
GFR, Estimated: >60 mL/min (ref >60)
Glucose, Bld: 93 mg/dL (ref 70–99)
Potassium: 4 mmol/L (ref 3.5–5.1)
Sodium: 138 mmol/L (ref 135–145)

## 2021-12-11 MED ORDER — CHLORHEXIDINE GLUCONATE CLOTH 2 % EX PADS
6.0000 | MEDICATED_PAD | Freq: Every day | CUTANEOUS | Status: DC
  Administered 2021-12-11 – 2021-12-14 (×4): 6 via TOPICAL

## 2021-12-11 MED ORDER — MUPIROCIN 2 % EX OINT
1.0000 "application " | TOPICAL_OINTMENT | Freq: Two times a day (BID) | CUTANEOUS | Status: DC
  Administered 2021-12-11 – 2021-12-14 (×6): 1 via NASAL
  Filled 2021-12-11: qty 22

## 2021-12-11 NOTE — Progress Notes (Signed)
Progress Note    Laurie Fuentes  JXB:[id number] DOB: 1925/12/13  DOA: 12/10/2021 PCP: Brayton Mars, MD      Brief Narrative:    Medical records reviewed and are as summarized below:  Laurie Fuentes is a 88 y.o. female  with medical history significant for stroke, subdural hematoma, dementia, hypertension, GERD, history of DVT, atrial fibrillation, fall, right clavicular fracture, rib fractures s/p repair, hearing impairment.  She was brought into nursing home because of altered mental status/lethargy.  Patient is confused and unable to provide any history.  History was obtained from Laurie Fuentes, granddaughter, at the bedside.  She said she went to the nursing home to visit her this afternoon and she noticed that she was more confused than usual.  She became more and more lethargic and could not even eat her food.  She was told by one of the nursing staff that patient had watery stools today.  She said her blood pressure was elevated at the nursing home.  No reported vomiting, pain or fever.      Assessment/Plan:   Principal Problem:   Acute UTI Active Problems:   Acute metabolic encephalopathy   Body mass index is 19.97 kg/m.   Acute UTI: Continue IV Rocephin.  Follow-up urine cultures.    Acute metabolic encephalopathy with underlying dementia: Improved.  No evidence of hypercapnia on venous blood gas.  No acute abnormality on CT head.  Hypertension: Continue antihypertensives  Mild sinus bradycardia: Asymptomatic    Diet Order             Diet Heart Room service appropriate? Yes; Fluid consistency: Thin  Diet effective now                      Consultants: None  Procedures: None    Medications:    amLODipine  5 mg Oral Daily   Chlorhexidine Gluconate Cloth  6 each Topical Q0600   enoxaparin (LOVENOX) injection  40 mg Subcutaneous Daily   feeding supplement  237 mL Oral BID BM   melatonin  5 mg Oral QHS   mupirocin ointment  1 application  Nasal BID   oxybutynin  5 mg Oral Daily   pantoprazole  40 mg Oral Daily   Continuous Infusions:  cefTRIAXone (ROCEPHIN)  IV       Anti-infectives (From admission, onward)    Start     Dose/Rate Route Frequency Ordered Stop   12/11/21 1600  cefTRIAXone (ROCEPHIN) 1 g in sodium chloride 0.9 % 100 mL IVPB        1 g 200 mL/hr over 30 Minutes Intravenous Every 24 hours 12/10/21 1731     12/10/21 1615  cefTRIAXone (ROCEPHIN) 1 g in sodium chloride 0.9 % 100 mL IVPB        1 g 200 mL/hr over 30 Minutes Intravenous  Once 12/10/21 1609 12/10/21 1931              Family Communication/Anticipated D/C date and plan/Code Status   DVT prophylaxis: enoxaparin (LOVENOX) injection 40 mg Start: 12/11/21 1000     Code Status: DNR  Family Communication: Laurie Fuentes, granddaughter Disposition Plan: Plan to discharge back to SNF in 1 to 2 days   Status is: Inpatient Remains inpatient appropriate because: IV antibiotics awaiting culture results               Subjective:   Interval events noted.  She is unable to provide any history.  Laurie Fuentes, granddaughter,  was at the bedside and she said patient is doing well and did well with her breakfast.  Objective:    Vitals:   12/11/21 0115 12/11/21 0447 12/11/21 0732 12/11/21 1108  BP: 138/82 129/60 (!) 154/60 (!) 119/48  Pulse: 60 (!) 59 66 (!) 53  Resp: 16 16 18 18   Temp: 97.9 F (36.6 C) 97.6 F (36.4 C) 98 F (36.7 C) 99.6 F (37.6 C)  TempSrc: Oral     SpO2:  96% 98% 97%  Weight:      Height:       No data found.   Intake/Output Summary (Last 24 hours) at 12/11/2021 1207 Last data filed at 12/11/2021 0754 Gross per 24 hour  Intake 150 ml  Output 60 ml  Net 90 ml   Filed Weights   12/10/21 1359  Weight: 54.4 kg    Exam:  GEN: NAD SKIN: Lesion on dorsal aspect of right hand EYES: No pallor or icterus ENT: MMM, hard of hearing CV: RRR PULM: CTA B ABD: soft, ND, NT, +BS CNS: AAO x 3, non focal EXT: No  edema or tenderness        Data Reviewed:   I have personally reviewed following labs and imaging studies:  Labs: Labs show the following:   Basic Metabolic Panel: Recent Labs  Lab 12/10/21 1403 12/11/21 0359  NA 139 138  K 3.6 4.0  CL 111 110  CO2 21* 25  GLUCOSE 113* 93  BUN 31* 26*  CREATININE 0.73 0.68  CALCIUM 8.1* 8.2*   GFR Estimated Creatinine Clearance: 37.7 mL/min (by C-G formula based on SCr of 0.68 mg/dL). Liver Function Tests: Recent Labs  Lab 12/10/21 1403  AST 15  ALT 8  ALKPHOS 70  BILITOT 0.6  PROT 6.0*  ALBUMIN 3.0*   No results for input(s): LIPASE, AMYLASE in the last 168 hours. No results for input(s): AMMONIA in the last 168 hours. Coagulation profile No results for input(s): INR, PROTIME in the last 168 hours.  CBC: Recent Labs  Lab 12/10/21 1403 12/11/21 0359  WBC 7.6 5.4  NEUTROABS 5.8  --   HGB 11.4* 9.5*  HCT 36.6 30.0*  MCV 88.6 86.5  PLT 265 232   Cardiac Enzymes: No results for input(s): CKTOTAL, CKMB, CKMBINDEX, TROPONINI in the last 168 hours. BNP (last 3 results) No results for input(s): PROBNP in the last 8760 hours. CBG: No results for input(s): GLUCAP in the last 168 hours. D-Dimer: No results for input(s): DDIMER in the last 72 hours. Hgb A1c: No results for input(s): HGBA1C in the last 72 hours. Lipid Profile: No results for input(s): CHOL, HDL, LDLCALC, TRIG, CHOLHDL, LDLDIRECT in the last 72 hours. Thyroid function studies: Recent Labs    12/10/21 1403  TSH 1.776   Anemia work up: No results for input(s): VITAMINB12, FOLATE, FERRITIN, TIBC, IRON, RETICCTPCT in the last 72 hours. Sepsis Labs: Recent Labs  Lab 12/10/21 1403 12/11/21 0359  WBC 7.6 5.4    Microbiology Recent Results (from the past 240 hour(s))  Resp Panel by RT-PCR (Flu A&B, Covid) Nasopharyngeal Swab     Status: None   Collection Time: 12/10/21  3:32 PM   Specimen: Nasopharyngeal Swab; Nasopharyngeal(NP) swabs in vial  transport medium  Result Value Ref Range Status   SARS Coronavirus 2 by RT PCR NEGATIVE NEGATIVE Final    Comment: (NOTE) SARS-CoV-2 target nucleic acids are NOT DETECTED.  The SARS-CoV-2 RNA is generally detectable in upper respiratory specimens during the acute phase  of infection. The lowest concentration of SARS-CoV-2 viral copies this assay can detect is 138 copies/mL. A negative result does not preclude SARS-Cov-2 infection and should not be used as the sole basis for treatment or other patient management decisions. A negative result may occur with  improper specimen collection/handling, submission of specimen other than nasopharyngeal swab, presence of viral mutation(s) within the areas targeted by this assay, and inadequate number of viral copies(<138 copies/mL). A negative result must be combined with clinical observations, patient history, and epidemiological information. The expected result is Negative.  Fact Sheet for Patients:  EntrepreneurPulse.com.au  Fact Sheet for Healthcare Providers:  IncredibleEmployment.be  This test is no t yet approved or cleared by the [place] FDA and  has been authorized for detection and/or diagnosis of SARS-CoV-2 by FDA under an Emergency Use Authorization (EUA). This EUA will remain  in effect (meaning this test can be used) for the duration of the COVID-19 declaration under Section 564(b)(1) of the Act, 21 U.S.C.section 360bbb-3(b)(1), unless the authorization is terminated  or revoked sooner.       Influenza A by PCR NEGATIVE NEGATIVE Final   Influenza B by PCR NEGATIVE NEGATIVE Final    Comment: (NOTE) The Xpert Xpress SARS-CoV-2/FLU/RSV plus assay is intended as an aid in the diagnosis of influenza from Nasopharyngeal swab specimens and should not be used as a sole basis for treatment. Nasal washings and aspirates are unacceptable for Xpert Xpress SARS-CoV-2/FLU/RSV testing.  Fact  Sheet for Patients: EntrepreneurPulse.com.au  Fact Sheet for Healthcare Providers: IncredibleEmployment.be  This test is not yet approved or cleared by the [place] FDA and has been authorized for detection and/or diagnosis of SARS-CoV-2 by FDA under an Emergency Use Authorization (EUA). This EUA will remain in effect (meaning this test can be used) for the duration of the COVID-19 declaration under Section 564(b)(1) of the Act, 21 U.S.C. section 360bbb-3(b)(1), unless the authorization is terminated or revoked.  Performed at [facility], [address] [place], [place] [address]   MRSA Next Gen by PCR, Nasal     Status: Abnormal   Collection Time: 12/10/21  6:45 PM   Specimen: Nasal Mucosa; Nasal Swab  Result Value Ref Range Status   MRSA by PCR Next Gen DETECTED (A) NOT DETECTED Final    Comment: RESULT CALLED TO, READ BACK BY AND VERIFIED WITH: Alen Blew 12/10/21 2012 MU (NOTE) The GeneXpert MRSA Assay (FDA approved for NASAL specimens only), is one component of a comprehensive MRSA colonization surveillance program. It is not intended to diagnose MRSA infection nor to guide or monitor treatment for MRSA infections. Test performance is not FDA approved in patients less than 66 years old. Performed at [facility], [address] [place], [place] [address]     Procedures and diagnostic studies:  CT HEAD WO CONTRAST (5MM)  Result Date: 12/10/2021 CLINICAL DATA:  Mental status change.  History of prior stroke. EXAM: CT HEAD WITHOUT CONTRAST TECHNIQUE: Contiguous axial images were obtained from the base of the skull through the vertex without intravenous contrast. RADIATION DOSE REDUCTION: This exam was performed according to the departmental dose-optimization program which includes automated exposure control, adjustment of the mA and/or kV according to patient size and/or use of iterative reconstruction technique.  COMPARISON:  07/21/2019 FINDINGS: Brain: Expected cerebral and cerebellar atrophy for age. Moderate low density in the periventricular white matter likely related to small vessel disease. Somewhat more confluent hypoattenuation in the left frontal deep white matter is similar and  may represent remote infarct. No mass lesion, hemorrhage, hydrocephalus, acute infarct, intra-axial, or extra-axial fluid collection. Vascular: Intracranial atherosclerosis. Skull: Hyperostosis frontalis interna. Venous lakes within the occipital region. Sinuses/Orbits: Normal imaged portions of the orbits and globes. Clear paranasal sinuses and mastoid air cells. Other: None. IMPRESSION: 1.  No acute intracranial abnormality. 2.  Cerebral atrophy and small vessel ischemic change. Electronically Signed   By: Abigail Miyamoto M.D.   On: 12/10/2021 14:47   DG Chest Portable 1 View  Result Date: 12/10/2021 CLINICAL DATA:  Shortness of breath.  Altered mental status. EXAM: PORTABLE CHEST 1 VIEW COMPARISON:  07/21/2019 FINDINGS: The heart size and mediastinal contours are within normal limits. Aortic atherosclerotic calcification noted. Both lungs are clear. Old fracture deformities of the right humeral neck and clavicle are again noted. IMPRESSION: No active disease. Electronically Signed   By: Marlaine Hind M.D.   On: 12/10/2021 14:28               LOS: 1 day   Crucita Lacorte  [facility]   Pager on [email or web address]. If 7PM-7AM, please contact night-coverage at [email or web address]     12/11/2021, 12:07 PM

## 2021-12-12 DIAGNOSIS — I10 Essential (primary) hypertension: Secondary | ICD-10-CM

## 2021-12-12 LAB — URINE CULTURE

## 2021-12-12 NOTE — TOC Progression Note (Signed)
Transition of Care [facility]) - Progression Note    Patient Details  Name: Laurie Fuentes MRN: [id number] Date of Birth: February 14, 1926  Transition of Care [facility]) CM/SW [place], RN Phone Number: 12/12/2021, 10:18 AM  Clinical Narrative:   The patient is from [facility] as a resident on LTC, [facility] to assist with DC planning, I attempted to contact the patients grand daughter [name] at [phone], no answer, unable to leave a message         Expected Discharge Plan and Services                                                 Social Determinants of Health (SDOH) Interventions    Readmission Risk Interventions No flowsheet data found.

## 2021-12-12 NOTE — Plan of Care (Signed)

## 2021-12-12 NOTE — NC FL2 (Signed)
[place] LEVEL OF CARE SCREENING TOOL     IDENTIFICATION  Patient Name: Laurie Fuentes Birthdate: [birth year] Sex: female Admission Date (Current Location): 12/10/2021  [facility] and [place] Number:  Engineering geologist and Address:  [facility], [address], [place], [place] [address]      Provider Number: [id number]  Attending Physician Name and Address:  Wyvonnia Dusky, MD  Relative Name and Phone Number:  [name] [phone]    Current Level of Care: Hospital Recommended Level of Care: Nursing Facility Prior Approval Number:    Date Approved/Denied:   PASRR Number:    Discharge Plan: Other (Comment) (Nursing facility LTC)    Current Diagnoses: Patient Active Problem List   Diagnosis Date Noted   Acute UTI 03/55/9741   Acute metabolic encephalopathy 63/84/5364   Palliative care by specialist    Goals of care, counseling/discussion    Fall    Subdural hematoma    Closed displaced fracture of shaft of right clavicle    UTI (urinary tract infection) 07/21/2019   Rib contusion, left, subsequent encounter 08/08/2017   Chronic atrial fibrillation ([place]) 05/28/2016   Hypokalemia 05/28/2016   Anticoagulant long-term use 04/02/2016   Hip fracture requiring operative repair ([place]) 01/07/2016   Essential hypertension 11/29/2015   Cerebral vascular disease 11/29/2015   GERD (gastroesophageal reflux disease) 06/14/2015   DVT of lower extremity (deep venous thrombosis) ([place]) 06/24/2014    Orientation RESPIRATION BLADDER Height & Weight     Self, Place, Situation  Normal External catheter Weight: 54.4 kg Height:  5\' 5"  (165.1 cm)  BEHAVIORAL SYMPTOMS/MOOD NEUROLOGICAL BOWEL NUTRITION STATUS      Continent Diet (regular)  AMBULATORY STATUS COMMUNICATION OF NEEDS Skin   Extensive Assist Verbally Normal                       Personal Care Assistance Level of Assistance              Functional Limitations Info   Hearing   Hearing Info: Impaired      SPECIAL CARE FACTORS FREQUENCY                       Contractures Contractures Info: Not present    Additional Factors Info  Code Status, Allergies Code Status Info: DNR Allergies Info: NKDA           Current Medications (12/12/2021):  This is the current hospital active medication list Current Facility-Administered Medications  Medication Dose Route Frequency Provider Last Rate Last Admin   acetaminophen (TYLENOL) tablet 650 mg  650 mg Oral Q6H PRN Jennye Boroughs, MD       Or   acetaminophen (TYLENOL) suppository 650 mg  650 mg Rectal Q6H PRN Jennye Boroughs, MD       amLODipine (NORVASC) tablet 5 mg  5 mg Oral Daily Jennye Boroughs, MD   5 mg at 12/12/21 0936   cefTRIAXone (ROCEPHIN) 1 g in sodium chloride 0.9 % 100 mL IVPB  1 g Intravenous Q24H Jennye Boroughs, MD   Stopped at 12/11/21 1613   Chlorhexidine Gluconate Cloth 2 % PADS 6 each  6 each Topical Q0600 Jennye Boroughs, MD   6 each at 12/12/21 0937   enoxaparin (LOVENOX) injection 40 mg  40 mg Subcutaneous Daily Jennye Boroughs, MD   40 mg at 12/12/21 0937   feeding supplement (ENSURE ENLIVE / ENSURE PLUS) liquid 237 mL  237 mL Oral BID  BM Jennye Boroughs, MD   237 mL at 12/12/21 3818   melatonin tablet 5 mg  5 mg Oral QHS Jennye Boroughs, MD   5 mg at 12/11/21 2152   mupirocin ointment (BACTROBAN) 2 % 1 application  1 application Nasal BID Jennye Boroughs, MD   1 application at 40/37/54 0937   ondansetron (ZOFRAN) tablet 4 mg  4 mg Oral Q6H PRN Jennye Boroughs, MD       Or   ondansetron [facility]) injection 4 mg  4 mg Intravenous Q6H PRN Jennye Boroughs, MD       oxybutynin (DITROPAN-XL) 24 hr tablet 5 mg  5 mg Oral Daily Jennye Boroughs, MD   5 mg at 12/12/21 0936   pantoprazole (PROTONIX) EC tablet 40 mg  40 mg Oral Daily Jennye Boroughs, MD   40 mg at 12/12/21 3606     Discharge Medications: Please see discharge summary for a list of discharge medications.  Relevant Imaging  Results:  Relevant Lab Results:   Additional Information ss [id number]  Conception Oms, RN

## 2021-12-12 NOTE — Progress Notes (Signed)
PROGRESS NOTE    Laurie Fuentes  PNT:[id number] DOB: Nov 16, 1925 DOA: 12/10/2021 PCP: Brayton Mars, MD  Assessment & Plan:   Principal Problem:   Acute UTI Active Problems:   Acute metabolic encephalopathy   Acute UTI: continue IV Rocephin. Urine cx shows containment   Acute metabolic encephalopathy: with underlying dementia. CT head shows no acute intracranial findings   HTN: continue on home dose of amlodipine   Mild sinus bradycardia: asymptomatic  Likely malnourished: BMI 19.9.   DVT prophylaxis: lovenox  Code Status: DNR Family Communication:  called pt's granddaughter but wrong number in chart  Disposition Plan: depends on PT/OT recs  Level of care: Med-Surg  Status is: Inpatient Remains inpatient appropriate because: severity of illness     Consultants:    Procedures:   Antimicrobials: rocephin   Subjective: Pt c/o needing help but cannot expand further on what she needs help with   Objective: Vitals:   12/11/21 1536 12/11/21 1949 12/12/21 0444 12/12/21 0729  BP: (!) 116/52 (!) 142/53 (!) 153/63 (!) 142/66  Pulse: 62 64 62 (!) 57  Resp: 16 16 16 16   Temp: 99.6 F (37.6 C) 98.7 F (37.1 C) 98.9 F (37.2 C) 98.3 F (36.8 C)  TempSrc:      SpO2: 98% 97% 98% 97%  Weight:      Height:        Intake/Output Summary (Last 24 hours) at 12/12/2021 0843 Last data filed at 12/11/2021 1729 Gross per 24 hour  Intake 250 ml  Output --  Net 250 ml   Filed Weights   12/10/21 1359  Weight: 54.4 kg    Examination:  General exam: Appears calm and comfortable. Frail appearing Respiratory system: Clear to auscultation. Respiratory effort normal. Cardiovascular system: S1 & S2 +. No rubs, gallops or clicks. Gastrointestinal system: Abdomen is nondistended, soft and nontender. Normal bowel sounds heard. Central nervous system: Alert and awake. Moves all extremities  Psychiatry: Judgement and insight appear poor. Flat mood and affect    Data  Reviewed: I have personally reviewed following labs and imaging studies  CBC: Recent Labs  Lab 12/10/21 1403 12/11/21 0359  WBC 7.6 5.4  NEUTROABS 5.8  --   HGB 11.4* 9.5*  HCT 36.6 30.0*  MCV 88.6 86.5  PLT 265 086   Basic Metabolic Panel: Recent Labs  Lab 12/10/21 1403 12/11/21 0359  NA 139 138  K 3.6 4.0  CL 111 110  CO2 21* 25  GLUCOSE 113* 93  BUN 31* 26*  CREATININE 0.73 0.68  CALCIUM 8.1* 8.2*   GFR: Estimated Creatinine Clearance: 37.7 mL/min (by C-G formula based on SCr of 0.68 mg/dL). Liver Function Tests: Recent Labs  Lab 12/10/21 1403  AST 15  ALT 8  ALKPHOS 70  BILITOT 0.6  PROT 6.0*  ALBUMIN 3.0*   No results for input(s): LIPASE, AMYLASE in the last 168 hours. No results for input(s): AMMONIA in the last 168 hours. Coagulation Profile: No results for input(s): INR, PROTIME in the last 168 hours. Cardiac Enzymes: No results for input(s): CKTOTAL, CKMB, CKMBINDEX, TROPONINI in the last 168 hours. BNP (last 3 results) No results for input(s): PROBNP in the last 8760 hours. HbA1C: No results for input(s): HGBA1C in the last 72 hours. CBG: No results for input(s): GLUCAP in the last 168 hours. Lipid Profile: No results for input(s): CHOL, HDL, LDLCALC, TRIG, CHOLHDL, LDLDIRECT in the last 72 hours. Thyroid Function Tests: Recent Labs    12/10/21 1403  TSH 1.776  FREET4 1.35*   Anemia Panel: No results for input(s): VITAMINB12, FOLATE, FERRITIN, TIBC, IRON, RETICCTPCT in the last 72 hours. Sepsis Labs: No results for input(s): PROCALCITON, LATICACIDVEN in the last 168 hours.  Recent Results (from the past 240 hour(s))  Urine Culture     Status: Abnormal   Collection Time: 12/10/21  2:03 PM   Specimen: Urine, Clean Catch  Result Value Ref Range Status   Specimen Description   Final    URINE, CLEAN CATCH Performed at [facility], [address], [place], [place] [address]    Special Requests   Final    NONE Performed at  [facility], [address] [place], [place] [address]    Culture MULTIPLE SPECIES PRESENT, SUGGEST RECOLLECTION (A)  Final   Report Status 12/12/2021 FINAL  Final  Resp Panel by RT-PCR (Flu A&B, Covid) Nasopharyngeal Swab     Status: None   Collection Time: 12/10/21  3:32 PM   Specimen: Nasopharyngeal Swab; Nasopharyngeal(NP) swabs in vial transport medium  Result Value Ref Range Status   SARS Coronavirus 2 by RT PCR NEGATIVE NEGATIVE Final    Comment: (NOTE) SARS-CoV-2 target nucleic acids are NOT DETECTED.  The SARS-CoV-2 RNA is generally detectable in upper respiratory specimens during the acute phase of infection. The lowest concentration of SARS-CoV-2 viral copies this assay can detect is 138 copies/mL. A negative result does not preclude SARS-Cov-2 infection and should not be used as the sole basis for treatment or other patient management decisions. A negative result may occur with  improper specimen collection/handling, submission of specimen other than nasopharyngeal swab, presence of viral mutation(s) within the areas targeted by this assay, and inadequate number of viral copies(<138 copies/mL). A negative result must be combined with clinical observations, patient history, and epidemiological information. The expected result is Negative.  Fact Sheet for Patients:  EntrepreneurPulse.com.au  Fact Sheet for Healthcare Providers:  IncredibleEmployment.be  This test is no t yet approved or cleared by the [place] FDA and  has been authorized for detection and/or diagnosis of SARS-CoV-2 by FDA under an Emergency Use Authorization (EUA). This EUA will remain  in effect (meaning this test can be used) for the duration of the COVID-19 declaration under Section 564(b)(1) of the Act, 21 U.S.C.section 360bbb-3(b)(1), unless the authorization is terminated  or revoked sooner.       Influenza A by PCR NEGATIVE NEGATIVE Final    Influenza B by PCR NEGATIVE NEGATIVE Final    Comment: (NOTE) The Xpert Xpress SARS-CoV-2/FLU/RSV plus assay is intended as an aid in the diagnosis of influenza from Nasopharyngeal swab specimens and should not be used as a sole basis for treatment. Nasal washings and aspirates are unacceptable for Xpert Xpress SARS-CoV-2/FLU/RSV testing.  Fact Sheet for Patients: EntrepreneurPulse.com.au  Fact Sheet for Healthcare Providers: IncredibleEmployment.be  This test is not yet approved or cleared by the [place] FDA and has been authorized for detection and/or diagnosis of SARS-CoV-2 by FDA under an Emergency Use Authorization (EUA). This EUA will remain in effect (meaning this test can be used) for the duration of the COVID-19 declaration under Section 564(b)(1) of the Act, 21 U.S.C. section 360bbb-3(b)(1), unless the authorization is terminated or revoked.  Performed at [facility], [address] [place], [place] [address]   MRSA Next Gen by PCR, Nasal     Status: Abnormal   Collection Time: 12/10/21  6:45 PM   Specimen: Nasal Mucosa; Nasal Swab  Result Value Ref Range Status  MRSA by PCR Next Gen DETECTED (A) NOT DETECTED Final    Comment: RESULT CALLED TO, READ BACK BY AND VERIFIED WITH: Alen Blew 12/10/21 2012 MU (NOTE) The GeneXpert MRSA Assay (FDA approved for NASAL specimens only), is one component of a comprehensive MRSA colonization surveillance program. It is not intended to diagnose MRSA infection nor to guide or monitor treatment for MRSA infections. Test performance is not FDA approved in patients less than 71 years old. Performed at [facility], [address], [place], [place] [address]          Radiology Studies: CT HEAD WO CONTRAST (5MM)  Result Date: 12/10/2021 CLINICAL DATA:  Mental status change.  History of prior stroke. EXAM: CT HEAD WITHOUT CONTRAST TECHNIQUE: Contiguous axial  images were obtained from the base of the skull through the vertex without intravenous contrast. RADIATION DOSE REDUCTION: This exam was performed according to the departmental dose-optimization program which includes automated exposure control, adjustment of the mA and/or kV according to patient size and/or use of iterative reconstruction technique. COMPARISON:  07/21/2019 FINDINGS: Brain: Expected cerebral and cerebellar atrophy for age. Moderate low density in the periventricular white matter likely related to small vessel disease. Somewhat more confluent hypoattenuation in the left frontal deep white matter is similar and may represent remote infarct. No mass lesion, hemorrhage, hydrocephalus, acute infarct, intra-axial, or extra-axial fluid collection. Vascular: Intracranial atherosclerosis. Skull: Hyperostosis frontalis interna. Venous lakes within the occipital region. Sinuses/Orbits: Normal imaged portions of the orbits and globes. Clear paranasal sinuses and mastoid air cells. Other: None. IMPRESSION: 1.  No acute intracranial abnormality. 2.  Cerebral atrophy and small vessel ischemic change. Electronically Signed   By: Abigail Miyamoto M.D.   On: 12/10/2021 14:47   DG Chest Portable 1 View  Result Date: 12/10/2021 CLINICAL DATA:  Shortness of breath.  Altered mental status. EXAM: PORTABLE CHEST 1 VIEW COMPARISON:  07/21/2019 FINDINGS: The heart size and mediastinal contours are within normal limits. Aortic atherosclerotic calcification noted. Both lungs are clear. Old fracture deformities of the right humeral neck and clavicle are again noted. IMPRESSION: No active disease. Electronically Signed   By: Marlaine Hind M.D.   On: 12/10/2021 14:28        Scheduled Meds:  amLODipine  5 mg Oral Daily   Chlorhexidine Gluconate Cloth  6 each Topical Q0600   enoxaparin (LOVENOX) injection  40 mg Subcutaneous Daily   feeding supplement  237 mL Oral BID BM   melatonin  5 mg Oral QHS   mupirocin ointment  1  application Nasal BID   oxybutynin  5 mg Oral Daily   pantoprazole  40 mg Oral Daily   Continuous Infusions:  cefTRIAXone (ROCEPHIN)  IV Stopped (12/11/21 1613)     LOS: 2 days    Time spent: 25 mins     Wyvonnia Dusky, MD [facility] Pager [phone]  If 7PM-7AM, please contact night-coverage 12/12/2021, 8:43 AM

## 2021-12-13 DIAGNOSIS — R001 Bradycardia, unspecified: Secondary | ICD-10-CM

## 2021-12-13 LAB — CBC
HCT: 36.5 % (ref 36.0–46.0)
Hemoglobin: 11.6 g/dL — ABNORMAL LOW (ref 12.0–15.0)
MCH: 28 pg (ref 26.0–34.0)
MCHC: 31.8 g/dL (ref 30.0–36.0)
MCV: 88 fL (ref 80.0–100.0)
Platelets: 221 10*3/uL (ref 150–400)
RBC: 4.15 MIL/uL (ref 3.87–5.11)
RDW: 14.5 % (ref 11.5–15.5)
WBC: 5.4 10*3/uL (ref 4.0–10.5)
nRBC: 0 % (ref 0.0–0.2)

## 2021-12-13 LAB — GLUCOSE, CAPILLARY: Glucose-Capillary: 113 mg/dL — ABNORMAL HIGH (ref 70–99)

## 2021-12-13 LAB — BASIC METABOLIC PANEL
Anion gap: 6 (ref 5–15)
BUN: 17 mg/dL (ref 8–23)
CO2: 26 mmol/L (ref 22–32)
Calcium: 8.7 mg/dL — ABNORMAL LOW (ref 8.9–10.3)
Chloride: 106 mmol/L (ref 98–111)
Creatinine, Ser: 0.39 mg/dL — ABNORMAL LOW (ref 0.44–1.00)
GFR, Estimated: >60 mL/min (ref >60)
Glucose, Bld: 90 mg/dL (ref 70–99)
Potassium: 3.2 mmol/L — ABNORMAL LOW (ref 3.5–5.1)
Sodium: 138 mmol/L (ref 135–145)

## 2021-12-13 MED ORDER — POTASSIUM CHLORIDE CRYS ER 20 MEQ PO TBCR
40.0000 meq | EXTENDED_RELEASE_TABLET | Freq: Once | ORAL | Status: AC
  Administered 2021-12-13: 40 meq via ORAL
  Filled 2021-12-13: qty 2

## 2021-12-13 MED ORDER — ISOSORBIDE MONONITRATE ER 30 MG PO TB24
15.0000 mg | ORAL_TABLET | Freq: Every day | ORAL | Status: DC
  Administered 2021-12-13 – 2021-12-14 (×2): 15 mg via ORAL
  Filled 2021-12-13 (×3): qty 1

## 2021-12-13 NOTE — Evaluation (Signed)
Occupational Therapy Evaluation Patient Details Name: Laurie Fuentes MRN: [id number] DOB: 04/21/1926 Today's Date: 12/13/2021   History of Present Illness Pt is a 88 y.o. female presenting to hospital 2/6 with AMS.  Pt admitted with acute UTI and acute metabolic encephalopathy.  PMH includes htn, hypokalemia, stroke, SDH, R clavicle fx, UTI, L rib contusion, a-fib, hip fx requiring operative repair 2017, and LE DVT.   Clinical Impression   Pt was seen for OT evaluation this date. Prior to hospital admission, pt was living at [facility], requiring assist from staff for pivot transfers to/from w/c (able to self propel) and toilet, requiring significant assist for bathing, dressing, and assist for self feeding 2/2 low vision. Granddaughter present and reports she assists with breakfast and lunch at [facility] to ensure pt eats. Gdtr endorses at baseline pt recognizes her and today was able to as well. Pt received in recliner, very [employer] but able to follow simple commands with simple VC and completed grooming task in sitting with set up and VC for sequencing. Pt appears close to baseline and currently pt demonstrates impairments as described below (See OT problem list) which functionally limit her ability to perform ADL/self-care tasks. Pt would benefit from skilled OT services to address noted impairments and functional limitations (see below for any additional details) in order to maximize safety and independence while minimizing falls risk and caregiver burden. Upon hospital discharge, recommend return to [facility] with assist for ADL/mobility.      Recommendations for follow up therapy are one component of a multi-disciplinary discharge planning process, led by the attending physician.  Recommendations may be updated based on patient status, additional functional criteria and insurance authorization.   Follow Up Recommendations  No OT follow up    Assistance Recommended at Discharge Frequent or constant  Supervision/Assistance  Patient can return home with the following A lot of help with bathing/dressing/bathroom;A lot of help with walking and/or transfers;Assistance with feeding;Assistance with cooking/housework;Assist for transportation;Help with stairs or ramp for entrance;Direct supervision/assist for financial management;Direct supervision/assist for medications management    Functional Status Assessment  Patient has had a recent decline in their functional status and/or demonstrates limited ability to make significant improvements in function in a reasonable and predictable amount of time  Equipment Recommendations  None recommended by OT    Recommendations for Other Services       Precautions / Restrictions Precautions Precautions: Fall Restrictions Weight Bearing Restrictions: No      Mobility Bed Mobility               General bed mobility comments: NT. up in recliner    Transfers                   General transfer comment: granddaughter noting pt preference to remain up in recliner as much as possible      Balance Overall balance assessment: Needs assistance Sitting-balance support: No upper extremity supported, Feet supported Sitting balance-Leahy Scale: Good                                     ADL either performed or assessed with clinical judgement   ADL                                         General ADL  Comments: Pt set up with toothbrush in L hand and with VC was able to initiate brushing teeth, VC for sequencing. Pt requires MAX A for LB ADL, MOD A UB ADL. Expect this is close to baseline, per pt's performance and granddaughter's input on PLOF     Vision         Perception     Praxis      Pertinent Vitals/Pain Pain Assessment Pain Assessment: Faces Faces Pain Scale: No hurt Pain Intervention(s): Limited activity within patient's tolerance, Monitored during session     Hand Dominance      Extremity/Trunk Assessment Upper Extremity Assessment Upper Extremity Assessment: Generalized weakness   Lower Extremity Assessment Lower Extremity Assessment: Generalized weakness       Communication Communication Communication: HOH   Cognition Arousal/Alertness: Awake/alert Behavior During Therapy: Flat affect Overall Cognitive Status: Difficult to assess                                 General Comments: very [facility], does follow simple commands; granddaughter does note that pt's cognition has improved since admission, was able to recognize granddaughter this morning     General Comments       Exercises     Shoulder Instructions      Home Living Family/patient expects to be discharged to:: Skilled nursing facility                                 Additional Comments: Long term care at [facility]      Prior Functioning/Environment Prior Level of Function : Needs assist  Cognitive Assist : Mobility (cognitive);ADLs (cognitive) Mobility (Cognitive): Step by step cues (complicated d/t pt's HOH) ADLs (Cognitive): Step by step cues (complicated d/t pt's HOH) Physical Assist : Mobility (physical);ADLs (physical) Mobility (physical): Bed mobility;Transfers ADLs (physical): Grooming;Bathing;Dressing;Toileting;IADLs Mobility Comments: Pt is able to get OOB on her own but requires 1 person assist to transfer to a w/c (pt non-ambulatory).  Pt uses her LE's and UE's to propel herself in w/c (spends a lot of her time in the w/c). ADLs Comments: significant assist for bathing, dressing; help for toilet transfers and able to tell staff when she needs to go; granddaughter helps feed her (lunch)        OT Problem List: Decreased cognition;Decreased safety awareness;Impaired balance (sitting and/or standing);Decreased knowledge of use of DME or AE;Impaired vision/perception;Decreased strength      OT Treatment/Interventions: Self-care/ADL  training;Therapeutic exercise;Therapeutic activities;Cognitive remediation/compensation;DME and/or AE instruction;Patient/family education;Balance training    OT Goals(Current goals can be found in the care plan section) Acute Rehab OT Goals Patient Stated Goal: pt does not state OT Goal Formulation: Patient unable to participate in goal setting Time For Goal Achievement: 12/27/21 Potential to Achieve Goals: Good ADL Goals Pt Will Transfer to Toilet: with mod assist;squat pivot transfer;stand pivot transfer;bedside commode Additional ADL Goal #1: Pt will perform seated grooming tasks with set up and VC to initiate, 3/3 opportunities.  OT Frequency: Min 1X/week    Co-evaluation              AM-PAC OT "6 Clicks" Daily Activity     Outcome Measure Help from another person eating meals?: A Little Help from another person taking care of personal grooming?: A Little Help from another person toileting, which includes using toliet, bedpan, or urinal?: A Lot Help from another  person bathing (including washing, rinsing, drying)?: A Lot Help from another person to put on and taking off regular upper body clothing?: A Lot Help from another person to put on and taking off regular lower body clothing?: A Lot 6 Click Score: 14   End of Session    Activity Tolerance: Patient tolerated treatment well Patient left: in chair;with call bell/phone within reach;with chair alarm set;with family/visitor present  OT Visit Diagnosis: Other abnormalities of gait and mobility (R26.89);Muscle weakness (generalized) (M62.81)                Time: 1357-1410 OT Time Calculation (min): 13 min Charges:  OT General Charges $OT Visit: 1 Visit OT Evaluation $OT Eval Moderate Complexity: 1 Mod  Ardeth Perfect., MPH, MS, OTR/L ascom [phone] 12/13/21, 3:50 PM

## 2021-12-13 NOTE — Evaluation (Signed)
Physical Therapy Evaluation Patient Details Name: Laurie Fuentes MRN: [id number] DOB: 1926/04/09 Today's Date: 12/13/2021  History of Present Illness  Pt is a 88 y.o. female presenting to hospital 2/6 with AMS.  Pt admitted with acute UTI and acute metabolic encephalopathy.  PMH includes htn, hypokalemia, stroke, SDH, R clavicle fx, UTI, L rib contusion, a-fib, hip fx requiring operative repair 2017, and LE DVT.  Clinical Impression  Prior to hospital admission, pt required 1 assist with transfers (w/c level) and pt able to propel self (with UE's and LE's) in w/c modified independently; lives at [facility] ([place]).  Currently pt is min to mod assist semi-supine to sitting edge of bed and max assist stand pivot bed to recliner.  Vc's required for technique (complicated d/t pt's HOH).  Pt would benefit from skilled PT during hospitalization to maintain pt's strength, balance, and limited functional mobility.  Upon hospital discharge, no further PT needs anticipated.    Recommendations for follow up therapy are one component of a multi-disciplinary discharge planning process, led by the attending physician.  Recommendations may be updated based on patient status, additional functional criteria and insurance authorization.  Follow Up Recommendations No PT follow up    Assistance Recommended at Discharge Frequent or constant Supervision/Assistance  Patient can return home with the following  A lot of help with walking and/or transfers;A lot of help with bathing/dressing/bathroom;Assistance with cooking/housework;Assistance with feeding;Direct supervision/assist for medications management;Direct supervision/assist for financial management;Assist for transportation    Equipment Recommendations None recommended by PT (pt has needed DME at home already)  Recommendations for Other Services       Functional Status Assessment Patient has had a recent decline in their functional status and/or  demonstrates limited ability to make significant improvements in function in a reasonable and predictable amount of time     Precautions / Restrictions Precautions Precautions: Fall Restrictions Weight Bearing Restrictions: No      Mobility  Bed Mobility Overal bed mobility: Needs Assistance Bed Mobility: Supine to Sit     Supine to sit: Min assist, Mod assist, HOB elevated     General bed mobility comments: assist to scoot towards edge of bed    Transfers Overall transfer level: Needs assistance Equipment used: None Transfers: Bed to chair/wheelchair/BSC   Stand pivot transfers: Max assist         General transfer comment: 1st trial pt did not assist WB'ing through B LE's so pt assisted back into sitting; 2nd trial (with increased vc's for technique) pt able to stand pivot bed to recliner max assist x1    Ambulation/Gait               General Gait Details: Deferred: pt non-ambulatory baseline  Stairs            Wheelchair Mobility    Modified Rankin (Stroke Patients Only)       Balance Overall balance assessment: Needs assistance Sitting-balance support: No upper extremity supported, Feet supported Sitting balance-Leahy Scale: Good Sitting balance - Comments: steady sitting reaching within BOS   Standing balance support: Bilateral upper extremity supported Standing balance-Leahy Scale: Poor Standing balance comment: assist for standing balance required                             Pertinent Vitals/Pain Pain Assessment Pain Assessment: Faces Faces Pain Scale: No hurt Pain Intervention(s): Limited activity within patient's tolerance, Monitored during session, Repositioned O2 stable and [facility]  throughout treatment session.  HR 53-69 bpm during sessions activities.    Home Living Family/patient expects to be discharged to:: Skilled nursing facility                   Additional Comments: [facility] term care at [facility]     Prior Function Prior Level of Function : Needs assist  Cognitive Assist : Mobility (cognitive);ADLs (cognitive) Mobility (Cognitive): Step by step cues (complicated d/t pt's HOH) ADLs (Cognitive): Step by step cues (complicated d/t pt's HOH) Physical Assist : Mobility (physical);ADLs (physical) Mobility (physical): Bed mobility;Transfers ADLs (physical): Grooming;Bathing;Dressing;Toileting;IADLs Mobility Comments: Pt is able to get OOB on her own but requires 1 person assist to transfer to a w/c (pt non-ambulatory).  Pt uses her LE's and UE's to propel herself in w/c (spends a lot of her time in the w/c).       Hand Dominance        Extremity/Trunk Assessment   Upper Extremity Assessment Upper Extremity Assessment: Generalized weakness    Lower Extremity Assessment Lower Extremity Assessment: Generalized weakness       Communication   Communication: HOH  Cognition Arousal/Alertness: Awake/alert Behavior During Therapy: Flat affect Overall Cognitive Status: Difficult to assess                                 General Comments: Pt did not answer many questions (anticipate d/t HOH)        General Comments  Nursing cleared pt for participation in physical therapy.  Pt and pt's granddaughter agreeable to PT session.     Exercises  Transfers   Assessment/Plan    PT Assessment Patient needs continued PT services  PT Problem List Decreased strength;Decreased mobility;Decreased cognition       PT Treatment Interventions Functional mobility training;Therapeutic activities;Therapeutic exercise;Balance training;Patient/family education    PT Goals (Current goals can be found in the Care Plan section)  Acute Rehab PT Goals Patient Stated Goal: to prevent loss of strength/mobility PT Goal Formulation: With family Time For Goal Achievement: 12/27/21 Potential to Achieve Goals: Fair    Frequency Min 2X/week     Co-evaluation               AM-PAC  PT "6 Clicks" Mobility  Outcome Measure Help needed turning from your back to your side while in a flat bed without using bedrails?: None Help needed moving from lying on your back to sitting on the side of a flat bed without using bedrails?: A Lot Help needed moving to and from a bed to a chair (including a wheelchair)?: A Lot Help needed standing up from a chair using your arms (e.g., wheelchair or bedside chair)?: A Lot Help needed to walk in hospital room?: Total Help needed climbing 3-5 steps with a railing? : Total 6 Click Score: 12    End of Session Equipment Utilized During Treatment: Gait belt Activity Tolerance: Patient tolerated treatment well Patient left: in chair;with call bell/phone within reach;with chair alarm set;with family/visitor present Nurse Communication: Mobility status;Precautions PT Visit Diagnosis: Other abnormalities of gait and mobility (R26.89);Muscle weakness (generalized) (M62.81)    Time: 6812-7517 PT Time Calculation (min) (ACUTE ONLY): 33 min   Charges:   PT Evaluation $PT Eval Low Complexity: 1 Low PT Treatments $Therapeutic Activity: 8-22 mins       Mayo Faulk, PT 12/13/21, 1:12 PM

## 2021-12-13 NOTE — Progress Notes (Signed)
PROGRESS NOTE    CARYLON TAMBURRO  PPI:[id number] DOB: 15-Sep-1926 DOA: 12/10/2021 PCP: Brayton Mars, MD  Assessment & Plan:   Principal Problem:   Acute UTI Active Problems:   Acute metabolic encephalopathy   Acute UTI: completed abx course. Urine cx shows containment   Acute metabolic encephalopathy: with underlying dementia. CT head shows no acute intracranial findings   HTN: will hold home dose of amlodipine. Start low dose imdur    Mild sinus bradycardia: w/ episodes of pauses. Will hold CCB. Asymptomatic  Likely malnourished: BMI 19.9. Encourage po intake  DVT prophylaxis: lovenox  Code Status: DNR Family Communication:  called pt's granddaughter but no answer Disposition Plan: d/c back home, [facility]   Level of care: Med-Surg  Status is: Inpatient Remains inpatient appropriate because: will monitor on tele for more cardiac pauses. Possibly can d/c tomorrow      Consultants:    Procedures:   Antimicrobials: rocephin   Subjective: Pt will not answer any of my questions today   Objective: Vitals:   12/12/21 0729 12/12/21 1140 12/12/21 1541 12/12/21 1956  BP: (!) 142/66 (!) 150/83 (!) 152/58 (!) 158/51  Pulse: (!) 57 (!) 57 (!) 56 (!) 59  Resp: 16 18 16 15   Temp: 98.3 F (36.8 C) 98.3 F (36.8 C) 98.8 F (37.1 C) 98.3 F (36.8 C)  TempSrc:      SpO2: 97% 98% 100%   Weight:      Height:        Intake/Output Summary (Last 24 hours) at 12/13/2021 0804 Last data filed at 12/12/2021 1446 Gross per 24 hour  Intake --  Output 700 ml  Net -700 ml   Filed Weights   12/10/21 1359  Weight: 54.4 kg    Examination:  General exam: Appears comfortable. Frail appearing  Respiratory system: clear breath sounds b/l Cardiovascular system: S1/S2+. No rubs or clicks. Gastrointestinal system: Abd is soft, NT, ND & hypoactive bowel sounds. Central nervous system: alert and awake. Moves all extremities  Psychiatry: judgement and insight appears poor.  Flat mood and affect    Data Reviewed: I have personally reviewed following labs and imaging studies  CBC: Recent Labs  Lab 12/10/21 1403 12/11/21 0359 12/13/21 0438  WBC 7.6 5.4 5.4  NEUTROABS 5.8  --   --   HGB 11.4* 9.5* 11.6*  HCT 36.6 30.0* 36.5  MCV 88.6 86.5 88.0  PLT 265 232 063   Basic Metabolic Panel: Recent Labs  Lab 12/10/21 1403 12/11/21 0359 12/13/21 0438  NA 139 138 138  K 3.6 4.0 3.2*  CL 111 110 106  CO2 21* 25 26  GLUCOSE 113* 93 90  BUN 31* 26* 17  CREATININE 0.73 0.68 0.39*  CALCIUM 8.1* 8.2* 8.7*   GFR: Estimated Creatinine Clearance: 37.7 mL/min (A) (by C-G formula based on SCr of 0.39 mg/dL (L)). Liver Function Tests: Recent Labs  Lab 12/10/21 1403  AST 15  ALT 8  ALKPHOS 70  BILITOT 0.6  PROT 6.0*  ALBUMIN 3.0*   No results for input(s): LIPASE, AMYLASE in the last 168 hours. No results for input(s): AMMONIA in the last 168 hours. Coagulation Profile: No results for input(s): INR, PROTIME in the last 168 hours. Cardiac Enzymes: No results for input(s): CKTOTAL, CKMB, CKMBINDEX, TROPONINI in the last 168 hours. BNP (last 3 results) No results for input(s): PROBNP in the last 8760 hours. HbA1C: No results for input(s): HGBA1C in the last 72 hours. CBG: No results for input(s):  GLUCAP in the last 168 hours. Lipid Profile: No results for input(s): CHOL, HDL, LDLCALC, TRIG, CHOLHDL, LDLDIRECT in the last 72 hours. Thyroid Function Tests: Recent Labs    12/10/21 1403  TSH 1.776  FREET4 1.35*   Anemia Panel: No results for input(s): VITAMINB12, FOLATE, FERRITIN, TIBC, IRON, RETICCTPCT in the last 72 hours. Sepsis Labs: No results for input(s): PROCALCITON, LATICACIDVEN in the last 168 hours.  Recent Results (from the past 240 hour(s))  Urine Culture     Status: Abnormal   Collection Time: 12/10/21  2:03 PM   Specimen: Urine, Clean Catch  Result Value Ref Range Status   Specimen Description   Final    URINE, CLEAN  CATCH Performed at [facility], [address] [place], [place] [address]    Special Requests   Final    NONE Performed at [facility], [address] [place], [place] [address]    Culture MULTIPLE SPECIES PRESENT, SUGGEST RECOLLECTION (A)  Final   Report Status 12/12/2021 FINAL  Final  Resp Panel by RT-PCR (Flu A&B, Covid) Nasopharyngeal Swab     Status: None   Collection Time: 12/10/21  3:32 PM   Specimen: Nasopharyngeal Swab; Nasopharyngeal(NP) swabs in vial transport medium  Result Value Ref Range Status   SARS Coronavirus 2 by RT PCR NEGATIVE NEGATIVE Final    Comment: (NOTE) SARS-CoV-2 target nucleic acids are NOT DETECTED.  The SARS-CoV-2 RNA is generally detectable in upper respiratory specimens during the acute phase of infection. The lowest concentration of SARS-CoV-2 viral copies this assay can detect is 138 copies/mL. A negative result does not preclude SARS-Cov-2 infection and should not be used as the sole basis for treatment or other patient management decisions. A negative result may occur with  improper specimen collection/handling, submission of specimen other than nasopharyngeal swab, presence of viral mutation(s) within the areas targeted by this assay, and inadequate number of viral copies(<138 copies/mL). A negative result must be combined with clinical observations, patient history, and epidemiological information. The expected result is Negative.  Fact Sheet for Patients:  EntrepreneurPulse.com.au  Fact Sheet for Healthcare Providers:  IncredibleEmployment.be  This test is no t yet approved or cleared by the [place] FDA and  has been authorized for detection and/or diagnosis of SARS-CoV-2 by FDA under an Emergency Use Authorization (EUA). This EUA will remain  in effect (meaning this test can be used) for the duration of the COVID-19 declaration under Section 564(b)(1) of the Act,  21 U.S.C.section 360bbb-3(b)(1), unless the authorization is terminated  or revoked sooner.       Influenza A by PCR NEGATIVE NEGATIVE Final   Influenza B by PCR NEGATIVE NEGATIVE Final    Comment: (NOTE) The Xpert Xpress SARS-CoV-2/FLU/RSV plus assay is intended as an aid in the diagnosis of influenza from Nasopharyngeal swab specimens and should not be used as a sole basis for treatment. Nasal washings and aspirates are unacceptable for Xpert Xpress SARS-CoV-2/FLU/RSV testing.  Fact Sheet for Patients: EntrepreneurPulse.com.au  Fact Sheet for Healthcare Providers: IncredibleEmployment.be  This test is not yet approved or cleared by the [place] FDA and has been authorized for detection and/or diagnosis of SARS-CoV-2 by FDA under an Emergency Use Authorization (EUA). This EUA will remain in effect (meaning this test can be used) for the duration of the COVID-19 declaration under Section 564(b)(1) of the Act, 21 U.S.C. section 360bbb-3(b)(1), unless the authorization is terminated or revoked.  Performed at [facility], [address] [place],  [place] [address]   MRSA Next Gen by PCR, Nasal     Status: Abnormal   Collection Time: 12/10/21  6:45 PM   Specimen: Nasal Mucosa; Nasal Swab  Result Value Ref Range Status   MRSA by PCR Next Gen DETECTED (A) NOT DETECTED Final    Comment: RESULT CALLED TO, READ BACK BY AND VERIFIED WITH: Alen Blew 12/10/21 2012 MU (NOTE) The GeneXpert MRSA Assay (FDA approved for NASAL specimens only), is one component of a comprehensive MRSA colonization surveillance program. It is not intended to diagnose MRSA infection nor to guide or monitor treatment for MRSA infections. Test performance is not FDA approved in patients less than 43 years old. Performed at [facility], [address] [place], [place] [address]          Radiology Studies: No results  found.      Scheduled Meds:  amLODipine  5 mg Oral Daily   Chlorhexidine Gluconate Cloth  6 each Topical Q0600   enoxaparin (LOVENOX) injection  40 mg Subcutaneous Daily   feeding supplement  237 mL Oral BID BM   melatonin  5 mg Oral QHS   mupirocin ointment  1 application Nasal BID   oxybutynin  5 mg Oral Daily   pantoprazole  40 mg Oral Daily   Continuous Infusions:  cefTRIAXone (ROCEPHIN)  IV 1 g (12/12/21 1540)     LOS: 3 days    Time spent: 15 mins     Wyvonnia Dusky, MD [facility] Pager [phone]  If 7PM-7AM, please contact night-coverage 12/13/2021, 8:04 AM

## 2021-12-13 NOTE — TOC Progression Note (Signed)
Transition of Care [facility]) - Progression Note    Patient Details  Name: Laurie Fuentes MRN: [id number] Date of Birth: Feb 23, 1926  Transition of Care [facility]) CM/SW [place], RN Phone Number: 12/13/2021, 10:47 AM  Clinical Narrative:   Met with the patient's grand daughter and updated the number in the chart, She stated that the patient will return to [facility] where she resides at [place], [employer] completed,     Expected Discharge Plan: [place] Barriers to Discharge: Continued Medical Work up  Expected Discharge Plan and Services Expected Discharge Plan: [place]   Discharge Planning Services: CM Consult   Living arrangements for the past 2 months: [place]                                       Social Determinants of Health (SDOH) Interventions    Readmission Risk Interventions No flowsheet data found.

## 2021-12-14 DIAGNOSIS — F03918 Unspecified dementia, unspecified severity, with other behavioral disturbance: Secondary | ICD-10-CM

## 2021-12-14 LAB — BASIC METABOLIC PANEL
Anion gap: 6 (ref 5–15)
BUN: 28 mg/dL — ABNORMAL HIGH (ref 8–23)
CO2: 26 mmol/L (ref 22–32)
Calcium: 9 mg/dL (ref 8.9–10.3)
Chloride: 104 mmol/L (ref 98–111)
Creatinine, Ser: 0.83 mg/dL (ref 0.44–1.00)
GFR, Estimated: >60 mL/min (ref >60)
Glucose, Bld: 100 mg/dL — ABNORMAL HIGH (ref 70–99)
Potassium: 4.2 mmol/L (ref 3.5–5.1)
Sodium: 136 mmol/L (ref 135–145)

## 2021-12-14 LAB — CBC
HCT: 34.4 % — ABNORMAL LOW (ref 36.0–46.0)
Hemoglobin: 10.8 g/dL — ABNORMAL LOW (ref 12.0–15.0)
MCH: 27.4 pg (ref 26.0–34.0)
MCHC: 31.4 g/dL (ref 30.0–36.0)
MCV: 87.3 fL (ref 80.0–100.0)
Platelets: 239 10*3/uL (ref 150–400)
RBC: 3.94 MIL/uL (ref 3.87–5.11)
RDW: 14.8 % (ref 11.5–15.5)
WBC: 6.1 10*3/uL (ref 4.0–10.5)
nRBC: 0 % (ref 0.0–0.2)

## 2021-12-14 MED ORDER — ISOSORBIDE MONONITRATE ER 30 MG PO TB24: 15.0000 mg | ORAL_TABLET | Freq: Every day | ORAL | 0 refills | Status: AC

## 2021-12-14 NOTE — Discharge Summary (Signed)
Physician Discharge Summary  Laurie Fuentes WLN:[id number] DOB: 05-Aug-1926 DOA: 12/10/2021  PCP: Brayton Mars, MD  Admit date: 12/10/2021 Discharge date: 12/14/2021  Admitted From: home facility  Disposition: home facility   Recommendations for Outpatient Follow-up:  Follow up with PCP in 1-2 weeks  Home Health: no  Equipment/Devices:  Discharge Condition: stable  CODE STATUS: DNR  Diet recommendation: Heart Healthy   Brief/Interim Summary: HPI was taken from Dr. Mal Misty: Laurie Fuentes is a 88 y.o. female with medical history significant for stroke, subdural hematoma, dementia, hypertension, GERD, history of DVT, atrial fibrillation, fall, right clavicular fracture, rib fractures s/p repair, hearing impairment.  She was brought into nursing home because of altered mental status/lethargy.  Patient is confused and unable to provide any history.  History was obtained from [place], granddaughter, at the bedside.  She said she went to the nursing home to visit her this afternoon and she noticed that she was more confused than usual.  She became more and more lethargic and could not even eat her food.  She was told by one of the nursing staff that patient had watery stools today.  She said her blood pressure was elevated at the nursing home.  No reported vomiting, pain or fever.   ED Course:  The patient bradycardic, hypertensive and was found to have abnormal urinalysis consistent with UTI.  She was given 1 L of normal saline and IV Rocephin.  Hospital course from Dr. Jimmye Norman 2/8-2/10/23: Pt was initially thought to have UTI and was treated w/ IV rocephin. Urine cx showed containment. Of note, pt's home dose of amlodipine was d/c secondary to bradycardia and pt was started on low dose imdur for HTN.   Discharge Diagnoses:  Principal Problem:   Acute UTI Active Problems:   Acute metabolic encephalopathy  Acute UTI: completed abx course. Urine cx shows containment   Acute metabolic  encephalopathy: with underlying dementia. CT head shows no acute intracranial findings. Back to baseline   HTN: d/c amlodipine secondary to bradycardia. Started on and continue on low dose imdur   Mild sinus bradycardia: w/ episodes of pauses. D/c CCB. Asymptomatic   Likely malnourished: BMI 19.9. Encourage po intake  Discharge Instructions  Discharge Instructions     Diet - low sodium heart healthy   Complete by: As directed    Discharge instructions   Complete by: As directed    F/u w/ PCP in 1-2 weeks   Discharge wound care:   Complete by: As directed    Place a small piece of Vaseline or Xeroform gauze over the raised area on the dorsum of the right hand. Cover with kerlix. Change both every 2 days.   Increase activity slowly   Complete by: As directed       Allergies as of 12/14/2021   No Known Allergies      Medication List     STOP taking these medications    amLODipine 5 MG tablet Commonly known as: NORVASC   naproxen sodium 550 MG tablet Commonly known as: ANAPROX       TAKE these medications    acetaminophen 500 MG tablet Commonly known as: TYLENOL Take 500-1,000 mg by mouth every 6 (six) hours as needed for mild pain or fever.   alendronate 70 MG tablet Commonly known as: FOSAMAX Take 70 mg by mouth once a week.   calcium carbonate 500 MG chewable tablet Commonly known as: TUMS - dosed in mg elemental calcium Chew by mouth.  feeding supplement Liqd Take 237 mLs by mouth 2 (two) times daily between meals.   isosorbide mononitrate 30 MG 24 hr tablet Commonly known as: IMDUR Take 0.5 tablets (15 mg total) by mouth daily. Start taking on: December 15, 2021   melatonin 3 MG Tabs tablet Take 1 tablet by mouth at bedtime.   omeprazole 40 MG capsule Commonly known as: PRILOSEC Take 40 mg by mouth daily.   oxybutynin 5 MG 24 hr tablet Commonly known as: DITROPAN-XL Take 5 mg by mouth daily.   polyethylene glycol 17 g packet Commonly  known as: MIRALAX / GLYCOLAX Take 17 g by mouth daily.   sennosides-docusate sodium 8.6-50 MG tablet Commonly known as: SENOKOT-S Take 2 tablets by mouth daily.               Discharge Care Instructions  (From admission, onward)           Start     Ordered   12/14/21 0000  Discharge wound care:       Comments: Place a small piece of Vaseline or Xeroform gauze over the raised area on the dorsum of the right hand. Cover with kerlix. Change both every 2 days.   12/14/21 1238            Contact information for after-discharge care     Destination     [facility] .   Service: Skilled Nursing Contact information: [address] [place] [phone]                    No Known Allergies  Consultations:    Procedures/Studies: CT HEAD WO CONTRAST (5MM)  Result Date: 12/10/2021 CLINICAL DATA:  Mental status change.  History of prior stroke. EXAM: CT HEAD WITHOUT CONTRAST TECHNIQUE: Contiguous axial images were obtained from the base of the skull through the vertex without intravenous contrast. RADIATION DOSE REDUCTION: This exam was performed according to the departmental dose-optimization program which includes automated exposure control, adjustment of the mA and/or kV according to patient size and/or use of iterative reconstruction technique. COMPARISON:  07/21/2019 FINDINGS: Brain: Expected cerebral and cerebellar atrophy for age. Moderate low density in the periventricular white matter likely related to small vessel disease. Somewhat more confluent hypoattenuation in the left frontal deep white matter is similar and may represent remote infarct. No mass lesion, hemorrhage, hydrocephalus, acute infarct, intra-axial, or extra-axial fluid collection. Vascular: Intracranial atherosclerosis. Skull: Hyperostosis frontalis interna. Venous lakes within the occipital region. Sinuses/Orbits: Normal imaged  portions of the orbits and globes. Clear paranasal sinuses and mastoid air cells. Other: None. IMPRESSION: 1.  No acute intracranial abnormality. 2.  Cerebral atrophy and small vessel ischemic change. Electronically Signed   By: Abigail Miyamoto M.D.   On: 12/10/2021 14:47   DG Chest Portable 1 View  Result Date: 12/10/2021 CLINICAL DATA:  Shortness of breath.  Altered mental status. EXAM: PORTABLE CHEST 1 VIEW COMPARISON:  07/21/2019 FINDINGS: The heart size and mediastinal contours are within normal limits. Aortic atherosclerotic calcification noted. Both lungs are clear. Old fracture deformities of the right humeral neck and clavicle are again noted. IMPRESSION: No active disease. Electronically Signed   By: Marlaine Hind M.D.   On: 12/10/2021 14:28   (Echo, Carotid, EGD, Colonoscopy, ERCP)    Subjective: Pt does not c/o anything   Discharge Exam: Vitals:   12/14/21 0307 12/14/21 0853  BP: 131/62 129/63  Pulse: (!) 59 (!) 55  Resp: 14  14  Temp: 97.8 F (36.6 C) 97.8 F (36.6 C)  SpO2: 100% 98%   Vitals:   12/13/21 1545 12/13/21 1930 12/14/21 0307 12/14/21 0853  BP: (!) 148/61 127/76 131/62 129/63  Pulse: (!) 57 76 (!) 59 (!) 55  Resp: 14 18 14 14   Temp: 97.6 F (36.4 C) (!) 97.4 F (36.3 C) 97.8 F (36.6 C) 97.8 F (36.6 C)  TempSrc: Oral     SpO2:  99% 100% 98%  Weight:      Height:        General: Pt is alert, awake, not in acute distress. Frail appearing  Cardiovascular: S1/S2 +, no rubs, no gallops Respiratory: CTA bilaterally, no wheezing, no rhonchi Abdominal: Soft, NT, ND, bowel sounds + Extremities: no edema, no cyanosis    The results of significant diagnostics from this hospitalization (including imaging, microbiology, ancillary and laboratory) are listed below for reference.     Microbiology: Recent Results (from the past 240 hour(s))  Urine Culture     Status: Abnormal   Collection Time: 12/10/21  2:03 PM   Specimen: Urine, Clean Catch  Result Value Ref  Range Status   Specimen Description   Final    URINE, CLEAN CATCH Performed at [facility], [address] [place], [place] [address]    Special Requests   Final    NONE Performed at [facility], [address] [place], [place] [address]    Culture MULTIPLE SPECIES PRESENT, SUGGEST RECOLLECTION (A)  Final   Report Status 12/12/2021 FINAL  Final  Resp Panel by RT-PCR (Flu A&B, Covid) Nasopharyngeal Swab     Status: None   Collection Time: 12/10/21  3:32 PM   Specimen: Nasopharyngeal Swab; Nasopharyngeal(NP) swabs in vial transport medium  Result Value Ref Range Status   SARS Coronavirus 2 by RT PCR NEGATIVE NEGATIVE Final    Comment: (NOTE) SARS-CoV-2 target nucleic acids are NOT DETECTED.  The SARS-CoV-2 RNA is generally detectable in upper respiratory specimens during the acute phase of infection. The lowest concentration of SARS-CoV-2 viral copies this assay can detect is 138 copies/mL. A negative result does not preclude SARS-Cov-2 infection and should not be used as the sole basis for treatment or other patient management decisions. A negative result may occur with  improper specimen collection/handling, submission of specimen other than nasopharyngeal swab, presence of viral mutation(s) within the areas targeted by this assay, and inadequate number of viral copies(<138 copies/mL). A negative result must be combined with clinical observations, patient history, and epidemiological information. The expected result is Negative.  Fact Sheet for Patients:  EntrepreneurPulse.com.au  Fact Sheet for Healthcare Providers:  IncredibleEmployment.be  This test is no t yet approved or cleared by the [place] FDA and  has been authorized for detection and/or diagnosis of SARS-CoV-2 by FDA under an Emergency Use Authorization (EUA). This EUA will remain  in effect (meaning this test can be used) for the duration of  the COVID-19 declaration under Section 564(b)(1) of the Act, 21 U.S.C.section 360bbb-3(b)(1), unless the authorization is terminated  or revoked sooner.       Influenza A by PCR NEGATIVE NEGATIVE Final   Influenza B by PCR NEGATIVE NEGATIVE Final    Comment: (NOTE) The Xpert Xpress SARS-CoV-2/FLU/RSV plus assay is intended as an aid in the diagnosis of influenza from Nasopharyngeal swab specimens and should not be used as a sole basis for treatment. Nasal washings and aspirates are unacceptable for Xpert Xpress SARS-CoV-2/FLU/RSV testing.  Fact Sheet  for Patients: EntrepreneurPulse.com.au  Fact Sheet for Healthcare Providers: IncredibleEmployment.be  This test is not yet approved or cleared by the [place] FDA and has been authorized for detection and/or diagnosis of SARS-CoV-2 by FDA under an Emergency Use Authorization (EUA). This EUA will remain in effect (meaning this test can be used) for the duration of the COVID-19 declaration under Section 564(b)(1) of the Act, 21 U.S.C. section 360bbb-3(b)(1), unless the authorization is terminated or revoked.  Performed at [facility], [address] [place], [place] [address]   MRSA Next Gen by PCR, Nasal     Status: Abnormal   Collection Time: 12/10/21  6:45 PM   Specimen: Nasal Mucosa; Nasal Swab  Result Value Ref Range Status   MRSA by PCR Next Gen DETECTED (A) NOT DETECTED Final    Comment: RESULT CALLED TO, READ BACK BY AND VERIFIED WITH: Alen Blew 12/10/21 2012 MU (NOTE) The GeneXpert MRSA Assay (FDA approved for NASAL specimens only), is one component of a comprehensive MRSA colonization surveillance program. It is not intended to diagnose MRSA infection nor to guide or monitor treatment for MRSA infections. Test performance is not FDA approved in patients less than 2 years old. Performed at [facility], [address] [place], [place] [address]       Labs: BNP (last 3 results) No results for input(s): BNP in the last 8760 hours. Basic Metabolic Panel: Recent Labs  Lab 12/10/21 1403 12/11/21 0359 12/13/21 0438 12/14/21 0503  NA 139 138 138 136  K 3.6 4.0 3.2* 4.2  CL 111 110 106 104  CO2 21* 25 26 26   GLUCOSE 113* 93 90 100*  BUN 31* 26* 17 28*  CREATININE 0.73 0.68 0.39* 0.83  CALCIUM 8.1* 8.2* 8.7* 9.0   Liver Function Tests: Recent Labs  Lab 12/10/21 1403  AST 15  ALT 8  ALKPHOS 70  BILITOT 0.6  PROT 6.0*  ALBUMIN 3.0*   No results for input(s): LIPASE, AMYLASE in the last 168 hours. No results for input(s): AMMONIA in the last 168 hours. CBC: Recent Labs  Lab 12/10/21 1403 12/11/21 0359 12/13/21 0438 12/14/21 0503  WBC 7.6 5.4 5.4 6.1  NEUTROABS 5.8  --   --   --   HGB 11.4* 9.5* 11.6* 10.8*  HCT 36.6 30.0* 36.5 34.4*  MCV 88.6 86.5 88.0 87.3  PLT 265 232 221 239   Cardiac Enzymes: No results for input(s): CKTOTAL, CKMB, CKMBINDEX, TROPONINI in the last 168 hours. BNP: Invalid input(s): POCBNP CBG: Recent Labs  Lab 12/13/21 1659  GLUCAP 113*   D-Dimer No results for input(s): DDIMER in the last 72 hours. Hgb A1c No results for input(s): HGBA1C in the last 72 hours. Lipid Profile No results for input(s): CHOL, HDL, LDLCALC, TRIG, CHOLHDL, LDLDIRECT in the last 72 hours. Thyroid function studies No results for input(s): TSH, T4TOTAL, T3FREE, THYROIDAB in the last 72 hours.  Invalid input(s): FREET3 Anemia work up No results for input(s): VITAMINB12, FOLATE, FERRITIN, TIBC, IRON, RETICCTPCT in the last 72 hours. Urinalysis    Component Value Date/Time   COLORURINE YELLOW 12/10/2021 1403   APPEARANCEUR CLOUDY (A) 12/10/2021 1403   APPEARANCEUR Clear 12/04/2011 2049   LABSPEC 1.025 12/10/2021 1403   LABSPEC 1.012 12/04/2011 2049   PHURINE 5.5 12/10/2021 1403   GLUCOSEU NEGATIVE 12/10/2021 1403   GLUCOSEU Negative 12/04/2011 2049   HGBUR TRACE (A) 12/10/2021 1403   BILIRUBINUR SMALL  (A) 12/10/2021 1403   BILIRUBINUR Negative 12/04/2011 2049  KETONESUR TRACE (A) 12/10/2021 1403   PROTEINUR 30 (A) 12/10/2021 1403   NITRITE POSITIVE (A) 12/10/2021 1403   LEUKOCYTESUR SMALL (A) 12/10/2021 1403   LEUKOCYTESUR Negative 12/04/2011 2049   Sepsis Labs Invalid input(s): PROCALCITONIN,  WBC,  LACTICIDVEN Microbiology Recent Results (from the past 240 hour(s))  Urine Culture     Status: Abnormal   Collection Time: 12/10/21  2:03 PM   Specimen: Urine, Clean Catch  Result Value Ref Range Status   Specimen Description   Final    URINE, CLEAN CATCH Performed at [facility] [address], [place], [place] [address]    Special Requests   Final    NONE Performed at [facility], [address] [place], [place] [address]    Culture MULTIPLE SPECIES PRESENT, SUGGEST RECOLLECTION (A)  Final   Report Status 12/12/2021 FINAL  Final  Resp Panel by RT-PCR (Flu A&B, Covid) Nasopharyngeal Swab     Status: None   Collection Time: 12/10/21  3:32 PM   Specimen: Nasopharyngeal Swab; Nasopharyngeal(NP) swabs in vial transport medium  Result Value Ref Range Status   SARS Coronavirus 2 by RT PCR NEGATIVE NEGATIVE Final    Comment: (NOTE) SARS-CoV-2 target nucleic acids are NOT DETECTED.  The SARS-CoV-2 RNA is generally detectable in upper respiratory specimens during the acute phase of infection. The lowest concentration of SARS-CoV-2 viral copies this assay can detect is 138 copies/mL. A negative result does not preclude SARS-Cov-2 infection and should not be used as the sole basis for treatment or other patient management decisions. A negative result may occur with  improper specimen collection/handling, submission of specimen other than nasopharyngeal swab, presence of viral mutation(s) within the areas targeted by this assay, and inadequate number of viral copies(<138 copies/mL). A negative result must be combined with clinical observations, patient  history, and epidemiological information. The expected result is Negative.  Fact Sheet for Patients:  EntrepreneurPulse.com.au  Fact Sheet for Healthcare Providers:  IncredibleEmployment.be  This test is no t yet approved or cleared by the [place] FDA and  has been authorized for detection and/or diagnosis of SARS-CoV-2 by FDA under an Emergency Use Authorization (EUA). This EUA will remain  in effect (meaning this test can be used) for the duration of the COVID-19 declaration under Section 564(b)(1) of the Act, 21 U.S.C.section 360bbb-3(b)(1), unless the authorization is terminated  or revoked sooner.       Influenza A by PCR NEGATIVE NEGATIVE Final   Influenza B by PCR NEGATIVE NEGATIVE Final    Comment: (NOTE) The Xpert Xpress SARS-CoV-2/FLU/RSV plus assay is intended as an aid in the diagnosis of influenza from Nasopharyngeal swab specimens and should not be used as a sole basis for treatment. Nasal washings and aspirates are unacceptable for Xpert Xpress SARS-CoV-2/FLU/RSV testing.  Fact Sheet for Patients: EntrepreneurPulse.com.au  Fact Sheet for Healthcare Providers: IncredibleEmployment.be  This test is not yet approved or cleared by the [place] FDA and has been authorized for detection and/or diagnosis of SARS-CoV-2 by FDA under an Emergency Use Authorization (EUA). This EUA will remain in effect (meaning this test can be used) for the duration of the COVID-19 declaration under Section 564(b)(1) of the Act, 21 U.S.C. section 360bbb-3(b)(1), unless the authorization is terminated or revoked.  Performed at [facility], [address] [place], [place] [address]   MRSA Next Gen by PCR, Nasal     Status: Abnormal   Collection Time: 12/10/21  6:45 PM   Specimen: Nasal Mucosa;  Nasal Swab  Result Value Ref Range Status   MRSA by PCR Next Gen DETECTED (A) NOT DETECTED Final     Comment: RESULT CALLED TO, READ BACK BY AND VERIFIED WITH: Alen Blew 12/10/21 2012 MU (NOTE) The GeneXpert MRSA Assay (FDA approved for NASAL specimens only), is one component of a comprehensive MRSA colonization surveillance program. It is not intended to diagnose MRSA infection nor to guide or monitor treatment for MRSA infections. Test performance is not FDA approved in patients less than 82 years old. Performed at [facility], [address], [place], [place] [address]      Time coordinating discharge: Over 30 minutes  SIGNED:   Wyvonnia Dusky, MD  [facility] 12/14/2021, 12:38 PM Pager   If 7PM-7AM, please contact night-coverage

## 2021-12-14 NOTE — Care Management Important Message (Signed)
Important Message  Patient Details  Name: Laurie Fuentes MRN: [id number] Date of Birth: [birth year]   Medicare Important Message Given:  Yes     Conception Oms, RN 12/14/2021, 1:11 PM

## 2021-12-14 NOTE — TOC Progression Note (Signed)
Transition of Care [facility]) - Progression Note    Patient Details  Name: Laurie Fuentes MRN: [id number] Date of Birth: 1926/06/16  Transition of Care [facility]) CM/SW [place], RN Phone Number: 12/14/2021, 1:08 PM  Clinical Narrative:   Attempted to call the patient's grand daughter and POA [name], the number is incorrect in the system I spoke to [name] the patient's daughter and she stated that she will call [name], I reviewed the important message from [facility] and put the written copy in the chart to go back to the facility     Expected Discharge Plan: Skilled Nursing Facility Barriers to Discharge: Continued Medical Work up  Expected Discharge Plan and Services Expected Discharge Plan: [place]   Discharge Planning Services: CM Consult   Living arrangements for the past 2 months: [place] Expected Discharge Date: 12/14/21                                     Social Determinants of Health (SDOH) Interventions    Readmission Risk Interventions No flowsheet data found.

## 2021-12-14 NOTE — TOC Progression Note (Signed)
Transition of Care [facility]) - Progression Note    Patient Details  Name: Laurie Fuentes MRN: [id number] Date of Birth: 27-Nov-1925  Transition of Care [facility]) CM/SW [place], RN Phone Number: 12/14/2021, 1:28 PM  Clinical Narrative:   Called EMS and scheduled Pick up, she is 5th on the list to go to [facility] room 113    Expected Discharge Plan: [place] Barriers to Discharge: Continued Medical Work up  Expected Discharge Plan and Services Expected Discharge Plan: [place]   Discharge Planning Services: CM Consult   Living arrangements for the past 2 months: [place] Expected Discharge Date: 12/14/21                                     Social Determinants of Health (SDOH) Interventions    Readmission Risk Interventions No flowsheet data found.

## 2021-12-14 NOTE — Care Management Important Message (Signed)
Important Message  Patient Details  Name: Laurie Fuentes MRN: [id number] Date of Birth: October 14, 1926   Medicare Important Message Given:  Other (see comment)  2/9 - Tried reaching LG, granddaughter, Octavia Heir at number listed in the medical record [phone]) and was told it was the wrong number.  2/10 Tried calling LG, granddaughter, Octavia Heir at number RN CM listed in her note [phone]) but was there was no answer so unable to leave a message.  Will try again later today.    Juliann Pulse A Brantlee Hinde 12/14/2021, 10:37 AM

## 2021-12-14 NOTE — Progress Notes (Signed)
Report called to [facility]. Patient transported via EMS.

## 2021-12-14 NOTE — Consult Note (Signed)
[facility] Nurse Consult Note: Patient receiving care in [facility] 144. Reason for Consult: raised lesion dorsum of right hand Wound type: highly likely some type of skin cancer Pressure Injury POA: Yes/No/NA Measurement: roughly 3 cm x 3cm Wound bed: raised, pink Drainage (amount, consistency, odor) none Periwound: intact Dressing procedure/placement/frequency: Place a small piece of Vaseline or Xeroform gauze over the raised area on the dorsum of the right hand. Cover with kerlix. Change both every 2 days.  For definitive determination of type of growth a dermatologic biopsy would be required.  [place] nurse will not follow at this time.  Please re-consult the [place] team if needed.  Val Riles, RN, MSN, CWOCN, CNS-BC, pager [phone]

## 2021-12-26 ENCOUNTER — Other Ambulatory Visit: Payer: Self-pay

## 2021-12-26 ENCOUNTER — Non-Acute Institutional Stay: Payer: Medicare Other | Admitting: Primary Care

## 2021-12-26 DIAGNOSIS — I482 Chronic atrial fibrillation, unspecified: Secondary | ICD-10-CM

## 2021-12-26 DIAGNOSIS — Z515 Encounter for palliative care: Secondary | ICD-10-CM

## 2021-12-26 DIAGNOSIS — I679 Cerebrovascular disease, unspecified: Secondary | ICD-10-CM

## 2021-12-26 NOTE — Progress Notes (Signed)
[facility] Palliative Care Consult Note Telephone: [phone]  Fax: [phone]    Date of encounter: 12/26/21 1:07 PM PATIENT NAME: Laurie Fuentes [address] [place] [address]   [phone] (home)  DOB: 04-24-1926 MRN: [id number] PRIMARY CARE PROVIDER:    Brayton Mars, MD,  [address] [place] [address] [phone]  [place]:   Brayton Mars, [place],  [place] [address] [phone]  RESPONSIBLE PARTY:    Contact Information     Name Relation Home Work Mobile   Gandy (LEGAL GUARDIAN) Granddaughter [phone]     Kallie Edward Daughter   [phone]   May,Vernon Yolanda Bonine   [phone]   Elton Sin [phone]         I met face to face with patient and family in [facility]. Palliative Care was asked to follow this patient by consultation request of  Brayton Mars, MD to address advance care planning and complex medical decision making. This is a follow up visit.                                   ASSESSMENT AND PLAN / RECOMMENDATIONS:   Advance Care Planning/Goals of Care: Goals include to maximize quality of life and symptom management. Patient/health care surrogate gave his/her permission to discuss.Our advance care planning conversation included a discussion about:     Exploration of personal, cultural or spiritual beliefs that might influence medical decisions  Exploration of goals of care in the event of a sudden injury or illness  CODE STATUS: DNR  Symptom Management/Plan:  I visited with patient  today at the nursing home. She was being visited by family members as well. They account that she had a hospitalization several weeks back for altered mental status which is diagnosed as encephalopathy. Urine was collected but final culture revealed contamination. While she was at the hospital she did receive Rocephin.  Blood pressure was  trending low during this hospital stay but staff states today at the nursing home that her blood pressures are better. I was not able to ascertain actual readings. Her weight = 136.4 lbs.   Family states she's eating less but sometimes doesn't like the food. They do bring outside food from time to time.  Patient's hand is wrapped as normal due to her picking and worsening wounds on her wrists. She appears at her baseline regarding mentation and she is very hard of hearing. She appears to be enjoying her visit with her granddaughter and great granddaughters.  Follow up Palliative Care Visit: Palliative care will continue to follow for complex medical decision making, advance care planning, and clarification of goals. Return 4 weeks or prn.  I spent 35 minutes providing this consultation. More than 50% of the time in this consultation was spent in counseling and care coordination.  PPS: 30%  HOSPICE ELIGIBILITY/DIAGNOSIS: TBD  Chief Complaint: debility  HISTORY OF PRESENT ILLNESS:  Laurie Fuentes is a 88 y.o. year old female  with recent hospital stay for UTI, dehydration, AMS, baseline has HOH and dementia. Followed for PC needs and monitoring as well as clarification of goals .   History obtained from review of EMR, discussion with primary team, and interview with family, facility staff/caregiver and/or Laurie Fuentes.  I reviewed available labs, medications, imaging, studies and related documents from the EMR.  Records reviewed and summarized above.  ROS   General: NAD EYES: denies vision changes ENMT: denies dysphagia Cardiovascular: denies chest pain, denies DOE Pulmonary: denies cough, denies increased SOB Abdomen: endorses fair  appetite, denies constipation, endorses continence of bowel GU: denies dysuria, endorses continence of urine MSK:   endorses  increased weakness,  no falls reported Skin: denies rashes, endorses R hand  wounds Neurological: denies pain, denies  insomnia Psych: Endorses positive mood Heme/lymph/immuno: denies bruises, abnormal bleeding  Physical Exam: Current and past weights: 136.4 lbs Constitutional: NAD General: frail appearing, WNWD EYES: anicteric sclera, lids intact, no discharge  ENMT: very Hard of hearing, oral mucous membranes moist CV: no LE edema Pulmonary: no increased work of breathing, no cough, room air Abdomen: intake 80%,  no ascites GU: deferred MSK: +sarcopenia, moves all extremities,  non ambulatory Skin: warm and dry, no rashes or wounds on visible skin Neuro:  + generalized weakness,  + cognitive impairment Psych: non-anxious affect, A and O x 2 Hem/lymph/immuno: no widespread bruising   Thank you for the opportunity to participate in the care of Laurie Fuentes.  The palliative care team will continue to follow. Please call our office at [phone] if we can be of additional assistance.   Jason Coop, NP DNP, AGPCNP-BC  COVID-19 PATIENT SCREENING TOOL Asked and negative response unless otherwise noted:   Have you had symptoms of covid, tested positive or been in contact with someone with symptoms/positive test in the past 5-10 days?

## 2022-02-14 ENCOUNTER — Non-Acute Institutional Stay: Payer: Medicare (Managed Care) | Admitting: Primary Care

## 2022-02-14 DIAGNOSIS — Z515 Encounter for palliative care: Secondary | ICD-10-CM

## 2022-02-14 DIAGNOSIS — I679 Cerebrovascular disease, unspecified: Secondary | ICD-10-CM

## 2022-02-14 NOTE — Progress Notes (Signed)
? ? ?[facility] ?Community Palliative Care Consult Note ?Telephone: [phone]  ?Fax: [phone]  ? ? ?Date of encounter: 02/14/22 ?12:21 PM ?PATIENT NAME: Laurie Fuentes ?[address] ?[place] [address]   ?[phone] (home)  ?DOB: 06-Oct-1926 ?MRN: [id number] ?PRIMARY CARE PROVIDER:    ?Brayton Mars, MD,  ?[place] ?[place] [address] ?[phone] ? ?REFERRING PROVIDER:   ?Brayton Mars, MD ?[place] ?[place],  [place] [address] ?[phone] ? ?RESPONSIBLE PARTY:    ?Contact Information   ? ? Name Relation Home Work Mobile  ? Octavia Heir (LEGAL GUARDIAN) Granddaughter [phone]    ? Whitfield,Brenda Daughter   [phone]  ? May,Vernon Yolanda Bonine   [phone]  ? Ricci Barker Granddaughter [phone]    ? ?  ? ? ? ?I met face to face with patient in [facility] facility. Palliative Care was asked to follow this patient by consultation request of  Brayton Mars, MD to address advance care planning and complex medical decision making. This is a follow up visit. ? ?                                 ASSESSMENT AND PLAN / RECOMMENDATIONS:  ? ?Advance Care Planning/Goals of Care: Goals include to maximize quality of life and symptom management. Patient/health care surrogate gave his/her permission to discuss. ?Decision not to resuscitate or to de-escalate disease focused treatments due to poor prognosis. ?CODE STATUS: DNR ? ?Symptom Management/Plan: ? ?I met with patient in her nursing home. She has been self -propelling in a wheelchair around the facility. Staff endorses  she had eaten her lunch in a congregate setting today. She continues to have periodic skin tears which she exacerbates with scratching. She has her right hand bandaged with such a skin tear. She's not had any hospitalization since my last assessment.  Weight is stable at 134 lbs. I have reviewed new labs and medication changes. ? ?Follow up Palliative Care Visit: Palliative care will continue to  follow for complex medical decision making, advance care planning, and clarification of goals. Return 6-8 weeks or prn. ? ?I spent 15 minutes providing this consultation. More than 50% of the time in this consultation was spent in counseling and care coordination. ? ?PPS: 40% ? ?HOSPICE ELIGIBILITY/DIAGNOSIS: TBD ? ?Chief Complaint: debility ? ?HISTORY OF PRESENT ILLNESS:  Laurie Fuentes is a 88 y.o. year old female  with hearing loss, debility, immobility . Patient seen today to review palliative care needs to include medical decision making and advance care planning as appropriate.  ? ?History obtained from review of EMR, discussion with primary team, and interview with family, facility staff/caregiver and/or Laurie Fuentes.  ?I reviewed available labs, medications, imaging, studies and related documents from the EMR.  Records reviewed and summarized above.  ? ?ROS ?/staff ? ?General: NAD ?ENMT: denies dysphagiaE ?Pulmonary: denies cough, denies increased SOB ?Abdomen: endorses good appetite, denies constipation, endorses continence of bowel ?GU: denies dysuria, endorses continence of urine ?MSK:  denies  increased weakness,  no falls reported ?Skin: endorses R hand wound ?Neurological: denies pain, denies insomnia ?Psych: Endorses positive mood ? ?Physical Exam: ?Current and past weights: 134 lbs ?Constitutional: NAD ?General: frail appearing, thin ?EYES: anicteric sclera, lids intact, no discharge  ?ENMT: hard of hearing, oral mucous membranes moist ?CV: no LE edema ?Pulmonary:  no increased work of breathing, no cough, room air ?Abdomen: intake 75%,  no ascites ?MSK: no  sarcopenia, moves all extremities,  non ambulatory ?Skin: warm and dry, no rashes on visible skin, + R hand wound  ?Neuro:  no new  generalized weakness,  +cognitive impairment, non-anxious affect ? ? ?Thank you for the opportunity to participate in the care of Laurie Fuentes.  The palliative care team will continue to follow. Please call our office  at [phone] if we can be of additional assistance.  ? ?Jason Coop, NP DNP, AGPCNP-BC ? ?COVID-19 PATIENT SCREENING TOOL ?Asked and negative response unless otherwise noted:  ? ?Have you had symptoms of covid, tested positive or been in contact with someone with symptoms/positive test in the past 5-10 days?  ? ?

## 2022-04-04 DEATH — deceased
